# Patient Record
Sex: Female | Born: 1950
Health system: Southern US, Community
[De-identification: ages and names within clinical notes are randomized; demographics above are authoritative.]

## PROBLEM LIST (undated history)

## (undated) DIAGNOSIS — R519 Headache, unspecified: Secondary | ICD-10-CM

## (undated) DIAGNOSIS — T7840XA Allergy, unspecified, initial encounter: Secondary | ICD-10-CM

## (undated) DIAGNOSIS — J45909 Unspecified asthma, uncomplicated: Secondary | ICD-10-CM

## (undated) DIAGNOSIS — E785 Hyperlipidemia, unspecified: Secondary | ICD-10-CM

## (undated) DIAGNOSIS — F32A Depression, unspecified: Secondary | ICD-10-CM

## (undated) DIAGNOSIS — M1990 Unspecified osteoarthritis, unspecified site: Secondary | ICD-10-CM

## (undated) DIAGNOSIS — F419 Anxiety disorder, unspecified: Secondary | ICD-10-CM

## (undated) DIAGNOSIS — G629 Polyneuropathy, unspecified: Secondary | ICD-10-CM

## (undated) DIAGNOSIS — R51 Headache: Secondary | ICD-10-CM

## (undated) DIAGNOSIS — E079 Disorder of thyroid, unspecified: Secondary | ICD-10-CM

## (undated) DIAGNOSIS — Z87442 Personal history of urinary calculi: Secondary | ICD-10-CM

## (undated) DIAGNOSIS — K589 Irritable bowel syndrome without diarrhea: Secondary | ICD-10-CM

## (undated) DIAGNOSIS — Z9889 Other specified postprocedural states: Secondary | ICD-10-CM

## (undated) DIAGNOSIS — R112 Nausea with vomiting, unspecified: Secondary | ICD-10-CM

## (undated) HISTORY — PX: MEDIAL PARTIAL KNEE REPLACEMENT: SHX5965

## (undated) HISTORY — DX: Unspecified asthma, uncomplicated: J45.909

## (undated) HISTORY — DX: Headache, unspecified: R51.9

## (undated) HISTORY — PX: COLONOSCOPY: SHX174

## (undated) HISTORY — DX: Headache: R51

## (undated) HISTORY — PX: KNEE ARTHROSCOPY: SUR90

## (undated) HISTORY — DX: Allergy, unspecified, initial encounter: T78.40XA

## (undated) HISTORY — PX: HERNIA REPAIR: SHX51

## (undated) HISTORY — PX: THYROID SURGERY: SHX805

## (undated) HISTORY — DX: Polyneuropathy, unspecified: G62.9

## (undated) HISTORY — DX: Hyperlipidemia, unspecified: E78.5

---

## 2004-10-24 ENCOUNTER — Other Ambulatory Visit: Payer: Self-pay

## 2004-10-24 ENCOUNTER — Emergency Department: Payer: Self-pay | Admitting: Emergency Medicine

## 2004-11-05 ENCOUNTER — Emergency Department: Payer: Self-pay | Admitting: General Practice

## 2004-11-05 ENCOUNTER — Emergency Department: Payer: Self-pay | Admitting: Emergency Medicine

## 2004-11-10 ENCOUNTER — Emergency Department: Payer: Self-pay | Admitting: General Practice

## 2005-04-15 ENCOUNTER — Ambulatory Visit: Payer: Self-pay | Admitting: Occupational Therapy

## 2013-08-20 ENCOUNTER — Ambulatory Visit: Payer: Self-pay | Admitting: Unknown Physician Specialty

## 2013-09-30 ENCOUNTER — Ambulatory Visit: Payer: Self-pay | Admitting: Unknown Physician Specialty

## 2014-09-20 DIAGNOSIS — M545 Low back pain, unspecified: Secondary | ICD-10-CM | POA: Insufficient documentation

## 2014-09-20 DIAGNOSIS — Z9660 Presence of unspecified orthopedic joint implant: Secondary | ICD-10-CM | POA: Insufficient documentation

## 2014-12-26 ENCOUNTER — Ambulatory Visit: Payer: Self-pay | Admitting: Unknown Physician Specialty

## 2015-02-24 DIAGNOSIS — J449 Chronic obstructive pulmonary disease, unspecified: Secondary | ICD-10-CM | POA: Insufficient documentation

## 2015-05-19 DIAGNOSIS — M5136 Other intervertebral disc degeneration, lumbar region: Secondary | ICD-10-CM | POA: Insufficient documentation

## 2015-05-19 DIAGNOSIS — M5416 Radiculopathy, lumbar region: Secondary | ICD-10-CM | POA: Insufficient documentation

## 2015-07-25 ENCOUNTER — Ambulatory Visit: Payer: Medicare Other | Attending: Specialist

## 2015-07-25 DIAGNOSIS — G4733 Obstructive sleep apnea (adult) (pediatric): Secondary | ICD-10-CM | POA: Insufficient documentation

## 2015-07-25 DIAGNOSIS — R0683 Snoring: Secondary | ICD-10-CM | POA: Diagnosis present

## 2015-07-25 DIAGNOSIS — G4700 Insomnia, unspecified: Secondary | ICD-10-CM | POA: Diagnosis present

## 2015-07-25 DIAGNOSIS — G479 Sleep disorder, unspecified: Secondary | ICD-10-CM | POA: Diagnosis present

## 2015-10-02 DIAGNOSIS — G4700 Insomnia, unspecified: Secondary | ICD-10-CM | POA: Insufficient documentation

## 2015-10-02 DIAGNOSIS — F5104 Psychophysiologic insomnia: Secondary | ICD-10-CM | POA: Insufficient documentation

## 2016-03-11 ENCOUNTER — Emergency Department: Payer: Medicare HMO

## 2016-03-11 ENCOUNTER — Inpatient Hospital Stay
Admission: EM | Admit: 2016-03-11 | Discharge: 2016-03-13 | DRG: 373 | Disposition: A | Discharge status: Home / Self Care | Payer: Medicare HMO | Attending: Surgery | Admitting: Surgery

## 2016-03-11 ENCOUNTER — Encounter: Payer: Self-pay | Admitting: Medical Oncology

## 2016-03-11 DIAGNOSIS — K3580 Unspecified acute appendicitis: Secondary | ICD-10-CM

## 2016-03-11 DIAGNOSIS — K353 Acute appendicitis with localized peritonitis, without perforation or gangrene: Secondary | ICD-10-CM

## 2016-03-11 DIAGNOSIS — Z87442 Personal history of urinary calculi: Secondary | ICD-10-CM

## 2016-03-11 DIAGNOSIS — E215 Disorder of parathyroid gland, unspecified: Secondary | ICD-10-CM | POA: Insufficient documentation

## 2016-03-11 DIAGNOSIS — Z9660 Presence of unspecified orthopedic joint implant: Secondary | ICD-10-CM | POA: Diagnosis present

## 2016-03-11 DIAGNOSIS — Z888 Allergy status to other drugs, medicaments and biological substances status: Secondary | ICD-10-CM

## 2016-03-11 DIAGNOSIS — Z882 Allergy status to sulfonamides status: Secondary | ICD-10-CM

## 2016-03-11 DIAGNOSIS — G43909 Migraine, unspecified, not intractable, without status migrainosus: Secondary | ICD-10-CM | POA: Insufficient documentation

## 2016-03-11 DIAGNOSIS — J449 Chronic obstructive pulmonary disease, unspecified: Secondary | ICD-10-CM | POA: Diagnosis present

## 2016-03-11 DIAGNOSIS — T7840XA Allergy, unspecified, initial encounter: Secondary | ICD-10-CM | POA: Insufficient documentation

## 2016-03-11 DIAGNOSIS — M1990 Unspecified osteoarthritis, unspecified site: Secondary | ICD-10-CM | POA: Insufficient documentation

## 2016-03-11 DIAGNOSIS — Z9889 Other specified postprocedural states: Secondary | ICD-10-CM | POA: Diagnosis not present

## 2016-03-11 HISTORY — DX: Disorder of thyroid, unspecified: E07.9

## 2016-03-11 HISTORY — DX: Unspecified acute appendicitis: K35.80

## 2016-03-11 LAB — CBC
HCT: 39.9 % (ref 35.0–47.0)
HEMOGLOBIN: 13.8 g/dL (ref 12.0–16.0)
MCH: 30.2 pg (ref 26.0–34.0)
MCHC: 34.6 g/dL (ref 32.0–36.0)
MCV: 87.3 fL (ref 80.0–100.0)
PLATELETS: 209 10*3/uL (ref 150–440)
RBC: 4.56 MIL/uL (ref 3.80–5.20)
RDW: 13.3 % (ref 11.5–14.5)
WBC: 5.2 10*3/uL (ref 3.6–11.0)

## 2016-03-11 LAB — URINALYSIS COMPLETE WITH MICROSCOPIC (ARMC ONLY)
BILIRUBIN URINE: NEGATIVE
Glucose, UA: NEGATIVE mg/dL
Hgb urine dipstick: NEGATIVE
KETONES UR: NEGATIVE mg/dL
Leukocytes, UA: NEGATIVE
NITRITE: NEGATIVE
PROTEIN: NEGATIVE mg/dL
RBC / HPF: NONE SEEN RBC/hpf (ref 0–5)
SPECIFIC GRAVITY, URINE: 1.014 (ref 1.005–1.030)
WBC UA: NONE SEEN WBC/hpf (ref 0–5)
pH: 9 — ABNORMAL HIGH (ref 5.0–8.0)

## 2016-03-11 LAB — COMPREHENSIVE METABOLIC PANEL
ALT: 24 U/L (ref 14–54)
AST: 22 U/L (ref 15–41)
Albumin: 4.3 g/dL (ref 3.5–5.0)
Alkaline Phosphatase: 78 U/L (ref 38–126)
Anion gap: 7 (ref 5–15)
BUN: 9 mg/dL (ref 6–20)
CHLORIDE: 105 mmol/L (ref 101–111)
CO2: 24 mmol/L (ref 22–32)
Calcium: 9.4 mg/dL (ref 8.9–10.3)
Creatinine, Ser: 0.56 mg/dL (ref 0.44–1.00)
Glucose, Bld: 107 mg/dL — ABNORMAL HIGH (ref 65–99)
POTASSIUM: 3.7 mmol/L (ref 3.5–5.1)
Sodium: 136 mmol/L (ref 135–145)
TOTAL PROTEIN: 7.2 g/dL (ref 6.5–8.1)
Total Bilirubin: 0.7 mg/dL (ref 0.3–1.2)

## 2016-03-11 LAB — LIPASE, BLOOD: LIPASE: 27 U/L (ref 11–51)

## 2016-03-11 MED ORDER — OXYCODONE HCL 5 MG PO TABS
10.0000 mg | ORAL_TABLET | ORAL | Status: DC | PRN
  Administered 2016-03-12: 10 mg via ORAL
  Filled 2016-03-11: qty 2

## 2016-03-11 MED ORDER — PIPERACILLIN-TAZOBACTAM 3.375 G IVPB
3.3750 g | Freq: Three times a day (TID) | INTRAVENOUS | Status: DC
  Administered 2016-03-11 – 2016-03-13 (×5): 3.375 g via INTRAVENOUS
  Filled 2016-03-11 (×7): qty 50

## 2016-03-11 MED ORDER — DIPHENHYDRAMINE HCL 50 MG/ML IJ SOLN: 12.5000 mg | Freq: Four times a day (QID) | INTRAMUSCULAR | Status: DC | PRN

## 2016-03-11 MED ORDER — PANTOPRAZOLE SODIUM 40 MG IV SOLR
40.0000 mg | Freq: Every day | INTRAVENOUS | Status: DC
  Administered 2016-03-11 – 2016-03-12 (×2): 40 mg via INTRAVENOUS
  Filled 2016-03-11 (×2): qty 40

## 2016-03-11 MED ORDER — KETOROLAC TROMETHAMINE 30 MG/ML IJ SOLN
INTRAMUSCULAR | Status: AC
  Administered 2016-03-11: 30 mg via INTRAVENOUS
  Filled 2016-03-11: qty 1

## 2016-03-11 MED ORDER — ENOXAPARIN SODIUM 40 MG/0.4ML ~~LOC~~ SOLN
40.0000 mg | SUBCUTANEOUS | Status: DC
  Administered 2016-03-12 – 2016-03-13 (×2): 40 mg via SUBCUTANEOUS
  Filled 2016-03-11 (×2): qty 0.4

## 2016-03-11 MED ORDER — PIPERACILLIN-TAZOBACTAM 3.375 G IVPB 30 MIN
3.3750 g | Freq: Once | INTRAVENOUS | Status: AC
  Administered 2016-03-11: 3.375 g via INTRAVENOUS
  Filled 2016-03-11: qty 50

## 2016-03-11 MED ORDER — OXYCODONE-ACETAMINOPHEN 5-325 MG PO TABS
ORAL_TABLET | ORAL | Status: AC
  Administered 2016-03-11: 1 via ORAL
  Filled 2016-03-11: qty 1

## 2016-03-11 MED ORDER — HYDROMORPHONE HCL 1 MG/ML IJ SOLN
0.5000 mg | INTRAMUSCULAR | Status: DC | PRN
  Administered 2016-03-11 – 2016-03-12 (×2): 0.5 mg via INTRAVENOUS
  Filled 2016-03-11 (×2): qty 1

## 2016-03-11 MED ORDER — CYCLOBENZAPRINE HCL 10 MG PO TABS
ORAL_TABLET | ORAL | Status: AC
  Administered 2016-03-11: 10 mg via ORAL
  Filled 2016-03-11: qty 1

## 2016-03-11 MED ORDER — ONDANSETRON 4 MG PO TBDP
4.0000 mg | ORAL_TABLET | Freq: Once | ORAL | Status: AC
  Administered 2016-03-11: 4 mg via ORAL

## 2016-03-11 MED ORDER — IOPAMIDOL (ISOVUE-300) INJECTION 61%
100.0000 mL | Freq: Once | INTRAVENOUS | Status: AC | PRN
  Administered 2016-03-11: 100 mL via INTRAVENOUS

## 2016-03-11 MED ORDER — OXYCODONE-ACETAMINOPHEN 5-325 MG PO TABS
1.0000 | ORAL_TABLET | ORAL | Status: DC | PRN
  Administered 2016-03-11: 1 via ORAL

## 2016-03-11 MED ORDER — QUETIAPINE FUMARATE 25 MG PO TABS
25.0000 mg | ORAL_TABLET | Freq: Every day | ORAL | Status: DC
  Filled 2016-03-11: qty 1

## 2016-03-11 MED ORDER — KETOROLAC TROMETHAMINE 30 MG/ML IJ SOLN
30.0000 mg | Freq: Four times a day (QID) | INTRAMUSCULAR | Status: DC
  Administered 2016-03-11 – 2016-03-13 (×7): 30 mg via INTRAVENOUS
  Filled 2016-03-11 (×7): qty 1

## 2016-03-11 MED ORDER — DIPHENHYDRAMINE HCL 12.5 MG/5ML PO ELIX: 12.5000 mg | ORAL_SOLUTION | Freq: Four times a day (QID) | ORAL | Status: DC | PRN

## 2016-03-11 MED ORDER — ONDANSETRON HCL 4 MG/2ML IJ SOLN
4.0000 mg | Freq: Once | INTRAMUSCULAR | Status: AC
  Administered 2016-03-11: 4 mg via INTRAVENOUS
  Filled 2016-03-11: qty 2

## 2016-03-11 MED ORDER — LACTATED RINGERS IV SOLN
INTRAVENOUS | Status: DC
  Administered 2016-03-11 – 2016-03-13 (×5): via INTRAVENOUS

## 2016-03-11 MED ORDER — ONDANSETRON HCL 4 MG/2ML IJ SOLN: 4.0000 mg | Freq: Four times a day (QID) | INTRAMUSCULAR | Status: DC | PRN

## 2016-03-11 MED ORDER — ONDANSETRON 4 MG PO TBDP
4.0000 mg | ORAL_TABLET | Freq: Four times a day (QID) | ORAL | Status: DC | PRN
  Filled 2016-03-11: qty 1

## 2016-03-11 MED ORDER — SODIUM CHLORIDE 0.9 % IV BOLUS (SEPSIS)
1000.0000 mL | Freq: Once | INTRAVENOUS | Status: AC
  Administered 2016-03-11: 1000 mL via INTRAVENOUS

## 2016-03-11 MED ORDER — HYDROMORPHONE HCL 1 MG/ML IJ SOLN
1.0000 mg | Freq: Once | INTRAMUSCULAR | Status: AC
  Administered 2016-03-11: 1 mg via INTRAVENOUS
  Filled 2016-03-11: qty 1

## 2016-03-11 MED ORDER — HYDROMORPHONE HCL 1 MG/ML IJ SOLN
1.0000 mg | Freq: Once | INTRAMUSCULAR | Status: AC
Start: 2016-03-11 — End: 2016-03-11
  Administered 2016-03-11: 1 mg via INTRAVENOUS
  Filled 2016-03-11: qty 1

## 2016-03-11 MED ORDER — ACETAMINOPHEN 500 MG PO TABS
1000.0000 mg | ORAL_TABLET | Freq: Four times a day (QID) | ORAL | Status: DC
  Filled 2016-03-11: qty 2

## 2016-03-11 MED ORDER — CYCLOBENZAPRINE HCL 10 MG PO TABS
10.0000 mg | ORAL_TABLET | Freq: Three times a day (TID) | ORAL | Status: DC
  Administered 2016-03-11 – 2016-03-13 (×6): 10 mg via ORAL
  Filled 2016-03-11 (×5): qty 1

## 2016-03-11 MED ORDER — ONDANSETRON 4 MG PO TBDP
ORAL_TABLET | ORAL | Status: AC
  Administered 2016-03-11: 4 mg via ORAL
  Filled 2016-03-11: qty 1

## 2016-03-11 NOTE — Progress Notes (Signed)
Pt arrived unit. VSS.  Angus Seller

## 2016-03-11 NOTE — H&P (Signed)
Danielle Nash is an 65 y.o. female.   Chief Complaint: abdominal pain, nausea and vomiting HPI: 65 year old female who comes in today with a complaint of abdominal pain starting around 3 AM today. Patient states that originally the pain was in her entire abdomen and was about a 2 out of 3 and pain. Patient states that she ate a banana this morning around 7 AM and shortly after the pain continued to intensify and go to her lower abdomen both her right and left side. Patient states that she then became nauseated when she was at the hospital and vomited. Patient denies any fever or chills. Patient states that the pain at this point is an 8 out of 10 and is in both her right and left lower quadrants as well as going around and through into her back as well. Patient states that the bumps in the road did not hurt and patient states that she has been moving around and writhing as well as getting up and walking around to try and help the pain however none of this helps. Patient also states that when she moves it does not make the pain worse. Patient is asking for something to eat and states that she is hungry. Patient does endorse some urinary urgency and frequency however denies any pain or burning or any hematuria.  Past Medical History  Diagnosis Date  . Thyroid disease     Past Surgical History  Procedure Laterality Date  . Thyroid surgery    . Joint replacement      History reviewed. No pertinent family history. Social History:  reports that she has never smoked. She does not have any smokeless tobacco history on file. Her alcohol and drug histories are not on file.  Allergies:  Allergies  Allergen Reactions  . Erythromycin   . Sulfa Antibiotics     Other reaction(s): Unknown     (Not in a hospital admission)  Results for orders placed or performed during the hospital encounter of 03/11/16 (from the past 48 hour(s))  Lipase, blood     Status: None   Collection Time: 03/11/16 10:22 AM   Result Value Ref Range   Lipase 27 11 - 51 U/L  Comprehensive metabolic panel     Status: Abnormal   Collection Time: 03/11/16 10:22 AM  Result Value Ref Range   Sodium 136 135 - 145 mmol/L   Potassium 3.7 3.5 - 5.1 mmol/L   Chloride 105 101 - 111 mmol/L   CO2 24 22 - 32 mmol/L   Glucose, Bld 107 (H) 65 - 99 mg/dL   BUN 9 6 - 20 mg/dL   Creatinine, Ser 0.56 0.44 - 1.00 mg/dL   Calcium 9.4 8.9 - 10.3 mg/dL   Total Protein 7.2 6.5 - 8.1 g/dL   Albumin 4.3 3.5 - 5.0 g/dL   AST 22 15 - 41 U/L   ALT 24 14 - 54 U/L   Alkaline Phosphatase 78 38 - 126 U/L   Total Bilirubin 0.7 0.3 - 1.2 mg/dL   GFR calc non Af Amer >60 >60 mL/min   GFR calc Af Amer >60 >60 mL/min    Comment: (NOTE) The eGFR has been calculated using the CKD EPI equation. This calculation has not been validated in all clinical situations. eGFR's persistently <60 mL/min signify possible Chronic Kidney Disease.    Anion gap 7 5 - 15  CBC     Status: None   Collection Time: 03/11/16 10:22 AM  Result Value  Ref Range   WBC 5.2 3.6 - 11.0 K/uL   RBC 4.56 3.80 - 5.20 MIL/uL   Hemoglobin 13.8 12.0 - 16.0 g/dL   HCT 39.9 35.0 - 47.0 %   MCV 87.3 80.0 - 100.0 fL   MCH 30.2 26.0 - 34.0 pg   MCHC 34.6 32.0 - 36.0 g/dL   RDW 13.3 11.5 - 14.5 %   Platelets 209 150 - 440 K/uL  Urinalysis complete, with microscopic ([facility] only)     Status: Abnormal   Collection Time: 03/11/16 10:22 AM  Result Value Ref Range   Color, Urine YELLOW (A) YELLOW   APPearance TURBID (A) CLEAR   Glucose, UA NEGATIVE NEGATIVE mg/dL   Bilirubin Urine NEGATIVE NEGATIVE   Ketones, ur NEGATIVE NEGATIVE mg/dL   Specific Gravity, Urine 1.014 1.005 - 1.030   Hgb urine dipstick NEGATIVE NEGATIVE   pH 9.0 (H) 5.0 - 8.0   Protein, ur NEGATIVE NEGATIVE mg/dL   Nitrite NEGATIVE NEGATIVE   Leukocytes, UA NEGATIVE NEGATIVE   RBC / HPF NONE SEEN 0 - 5 RBC/hpf   WBC, UA NONE SEEN 0 - 5 WBC/hpf   Bacteria, UA RARE (A) NONE SEEN   Squamous Epithelial / LPF  0-5 (A) NONE SEEN   Amorphous Crystal PRESENT    Ct Abdomen Pelvis W Contrast  03/11/2016  CLINICAL DATA:  65 year old female with generalized abdominal pain radiating into the flanks as well as nausea. EXAM: CT ABDOMEN AND PELVIS WITH CONTRAST TECHNIQUE: Multidetector CT imaging of the abdomen and pelvis was performed using the standard protocol following bolus administration of intravenous contrast. CONTRAST:  123m ISOVUE-300 IOPAMIDOL (ISOVUE-300) INJECTION 61% COMPARISON:  None. FINDINGS: Lower chest:  No acute findings. Hepatobiliary: No masses or other significant abnormality. Pancreas: No mass, inflammatory changes, or other significant abnormality. Spleen: Within normal limits in size and appearance. Adrenals/Urinary Tract: Normal adrenal glands. Solitary punctate stone in the interpolar left kidney. Small low-attenuation lesions in the kidneys bilaterally are too small for accurate characterization but statistically highly likely to represent benign cysts. No evidence of hydronephrosis. Partially duplicated left renal collecting system. Stomach/Bowel: Enlarged appendix in the right lower quadrant measuring up to 1.3 cm in diameter. The appendix contains multiple internal appendicolith. There is faint interstitial stranding in the adjacent appendiceal mesentery. No evidence of focal bowel wall thickening or obstruction. No free fluid or suspicious adenopathy. Vascular/Lymphatic: No aneurysm or dissection. No suspicious adenopathy. Reproductive: No mass or other significant abnormality. Other: None. Musculoskeletal: No suspicious bone lesions identified. L2-L3 degenerative disc disease. IMPRESSION: 1. CT findings concerning for early acute appendicitis. The appendix is dilated up to 1.3 cm and contains multiple appendicoliths. There is very mild interstitial stranding in the mesoappendix. No evidence of abscess or rupture. 2. Solitary punctate nonobstructing stone in the interpolar left kidney. 3. Focal  L2-L3 degenerative disc disease. Electronically Signed   By: HJacqulynn CadetM.D.   On: 03/11/2016 13:38    Review of Systems  Constitutional: Positive for malaise/fatigue. Negative for fever, chills and diaphoresis.  HENT: Negative for congestion and sore throat.   Respiratory: Negative for cough, shortness of breath and wheezing.   Cardiovascular: Negative for chest pain, palpitations and leg swelling.  Gastrointestinal: Positive for nausea, vomiting and abdominal pain. Negative for heartburn, diarrhea, constipation and blood in stool.  Genitourinary: Positive for urgency, hematuria and flank pain. Negative for dysuria and frequency.  Musculoskeletal: Positive for back pain. Negative for myalgias and falls.  Skin: Negative for itching and rash.  Neurological:  Positive for weakness. Negative for dizziness, loss of consciousness and headaches.  Psychiatric/Behavioral: Negative for depression. The patient is not nervous/anxious.   All other systems reviewed and are negative.   Blood pressure 121/69, pulse 60, temperature 97.6 F (36.4 C), temperature source Oral, resp. rate 18, height 5' (1.524 m), weight 170 lb (77.111 kg), SpO2 98 %. Physical Exam  Vitals reviewed. Constitutional: She is oriented to person, place, and time. She appears well-developed and well-nourished. No distress.  HENT:  Head: Normocephalic and atraumatic.  Right Ear: External ear normal.  Left Ear: External ear normal.  Nose: Nose normal.  Mouth/Throat: Oropharynx is clear and moist. No oropharyngeal exudate.  Eyes: Conjunctivae and EOM are normal. Pupils are equal, round, and reactive to light. No scleral icterus.  Neck: Normal range of motion. Neck supple. No tracheal deviation present.  Well healed surgical scar   Cardiovascular: Normal rate, regular rhythm, normal heart sounds and intact distal pulses.  Exam reveals no gallop and no friction rub.   No murmur heard. Respiratory: Effort normal and breath  sounds normal. No respiratory distress. She has no wheezes. She has no rales.  GI: Soft. Bowel sounds are normal. She exhibits no distension. There is tenderness. There is no rebound and no guarding.  Left sided CVA tenderness, soft, non-distended, right lower quadrant tenderness and Left lower quadrant tenderness, -Rosving's, -psoas, -obturator sign  Musculoskeletal: Normal range of motion. She exhibits no edema or tenderness.  Neurological: She is alert and oriented to person, place, and time. No cranial nerve deficit.  Skin: Skin is warm and dry. No rash noted. No erythema. No pallor.  Psychiatric: She has a normal mood and affect. Her behavior is normal. Judgment and thought content normal.     Assessment/Plan 65yrold -year-old female with abdominal pain nausea and vomiting. I reviewed her past medical history which is pertinent for some mild COPD as well as degenerative disc disease in her back. I reviewed her laboratory values which show a normal white blood cell count of 5.2 and otherwise lab values within normal limits. I also personally reviewed her CT scan images which do show an enlarged appendix to 11 mm in size however not any significant amount of stranding around the area as well as a left kidney stone in the left kidney and a small stone that looks to be in the left ureter to my evaluation. I also reviewed the radiology read as above.  I did discuss with the patient that she does not have the typical presentation for acute appendicitis and while her appendix is enlarged and there is not significant inflammation around it. Also discussed with her that she may have a kidney stone in her left ureter.   The risks, benefits, complications, treatment options, and expected outcomes were discussed with the patient. The treatment of antibiotics alone was discussed giving a 20% chance that this could fail and surgery would be necessary.  Also discussed continuing to the operating room for  Laparoscopic Appendectomy.  The possibilities of  bleeding, recurrent infection, perforation of viscus, finding a normal appendix, the need for additional procedures, failure to diagnose a condition, conversion to open procedure and creating a complication requiring transfusion or further operations were discussed. The patient was given the opportunity to ask questions and have them answered.  The patient would like to proceed with antibiotic therapy for the potential acute appendicitis. I will met her put her on IV antibiotics and continue keeping her hydrated as well as give  her IV pain medicine.  If she were to worsen from a laboratory or clinical standpoint we may still need to proceed with a laparoscopic appendectomy. Patient is aware and is in agreement with this plan.  Hubbard Robinson, MD 03/11/2016, 6:54 PM

## 2016-03-11 NOTE — ED Notes (Signed)
Pt reports last night around 1130pm she began having generalized abd pain that radiates into bilt flank. Also reports nausea without vomiting. Denies dysuria.

## 2016-03-11 NOTE — ED Provider Notes (Signed)
[facility] Emergency Department Provider Note  ____________________________________________  Time seen: 11:00 AM  I have reviewed the triage vital signs and the nursing notes.   HISTORY  Chief Complaint Abdominal Pain and Nausea    HPI Danielle Nash is a 65 y.o. female reports around midnight last night she started having generalized abdominal pain that radiates into her bilateral flanks. It is gradually worsening and constant. She cannot get comfortable. She also has nausea but no vomiting. Has a history of kidney stones but this feels much different. No urinary symptoms. No chest pain shortness of breath or fever. No diarrhea or constipation.  Last ate a banana and drink some water at 7 AM today which she vomited afterward.     Past Medical History  Diagnosis Date  . Thyroid disease      There are no active problems to display for this patient.    Past Surgical History  Procedure Laterality Date  . Thyroid surgery    . Joint replacement       No current outpatient prescriptions on file.   Allergies Erythromycin   No family history on file.  Social History Social History  Substance Use Topics  . Smoking status: Never Smoker   . Smokeless tobacco: None  . Alcohol Use: None    Review of Systems  Constitutional:   No fever or chills. No weight changes Eyes:   No vision changes.  ENT:   No sore throat. No rhinorrhea. Cardiovascular:   No chest pain. Respiratory:   No dyspnea or cough. Gastrointestinal:  Generalized abdominal pain without vomiting and diarrhea.  No BRBPR or melena. Genitourinary:   Negative for dysuria or difficulty urinating. Musculoskeletal:   Negative for focal pain or swelling Skin:   Negative for rash. Neurological:   Negative for headaches, focal weakness or numbness.  10-point ROS otherwise negative.  ____________________________________________   PHYSICAL EXAM:  VITAL SIGNS: ED Triage Vitals   Enc Vitals Group     BP 03/11/16 1014 153/89 mmHg     Pulse Rate 03/11/16 1014 60     Resp 03/11/16 1014 20     Temp 03/11/16 1014 97.6 F (36.4 C)     Temp Source 03/11/16 1014 Oral     SpO2 03/11/16 1014 99 %     Weight 03/11/16 1014 170 lb (77.111 kg)     Height 03/11/16 1014 5' (1.524 m)     Head Cir --      Peak Flow --      Pain Score 03/11/16 1015 7     Pain Loc --      Pain Edu? --      Excl. in [place]? --     Vital signs reviewed, nursing assessments reviewed.   Constitutional:   Alert and oriented. Moderate distress due to severe pain Eyes:   No scleral icterus. No conjunctival pallor. PERRL. EOMI ENT   Head:   Normocephalic and atraumatic.   Nose:   No congestion/rhinnorhea. No septal hematoma   Mouth/Throat:   MMM, no pharyngeal erythema. No peritonsillar mass.    Neck:   No stridor. No SubQ emphysema. No meningismus. Hematological/Lymphatic/Immunilogical:   No cervical lymphadenopathy. Cardiovascular:   RRR. Symmetric bilateral radial and DP pulses.  No murmurs.  Respiratory:   Normal respiratory effort without tachypnea nor retractions. Breath sounds are clear and equal bilaterally. No wheezes/rales/rhonchi. Gastrointestinal:   Soft with diffuse abdominal tenderness, worse in the right lower quadrant. There is some mild  CVA tenderness bilaterally..  No rebound, rigidity, or guarding. Genitourinary:   deferred Musculoskeletal:   Nontender with normal range of motion in all extremities. No joint effusions.  No lower extremity tenderness.  No edema. Neurologic:   Normal speech and language.  CN 2-10 normal. Motor grossly intact. No gross focal neurologic deficits are appreciated.  Skin:    Skin is warm, dry and intact. No rash noted.  No petechiae, purpura, or bullae. Psychiatric:   Mood and affect are normal. ____________________________________________    LABS (pertinent positives/negatives) (all labs ordered are listed, but only abnormal results  are displayed) Labs Reviewed  COMPREHENSIVE METABOLIC PANEL - Abnormal; Notable for the following:    Glucose, Bld 107 (*)    All other components within normal limits  URINALYSIS COMPLETEWITH MICROSCOPIC ([facility] ONLY) - Abnormal; Notable for the following:    Color, Urine YELLOW (*)    APPearance TURBID (*)    pH 9.0 (*)    Bacteria, UA RARE (*)    Squamous Epithelial / LPF 0-5 (*)    All other components within normal limits  LIPASE, BLOOD  CBC   ____________________________________________   EKG  Interpreted by me Sinus bradycardia rate 53, normal axis intervals QRS ST segments and T waves  ____________________________________________    RADIOLOGY  CT scan shows a 1.3 cm dilated appendix with multiple appendicoliths consistent with acute appendicitis.  ____________________________________________   PROCEDURES   ____________________________________________   INITIAL IMPRESSION / ASSESSMENT AND PLAN / ED COURSE  Pertinent labs & imaging results that were available during my care of the patient were reviewed by me and considered in my medical decision making (see chart for details).  Patient presents with severe abdominal pain. Labs and exam are not very localizing so proceeded to CT scan. CT positive for acute appendicitis. Pain is much more improved with IV Dilaudid. Case discussed with surgery at 2:20 PM who will direct the patient in the ED for further management. IV Zosyn for now, continue IV fluids. Pain control and antiemetics as needed. Nothing by mouth.     ____________________________________________   FINAL CLINICAL IMPRESSION(S) / ED DIAGNOSES  Final diagnoses:  Acute appendicitis with localized peritonitis      [name], MD 03/11/16 1421

## 2016-03-12 LAB — CBC
HCT: 34.5 % — ABNORMAL LOW (ref 35.0–47.0)
HEMOGLOBIN: 11.9 g/dL — AB (ref 12.0–16.0)
MCH: 30.2 pg (ref 26.0–34.0)
MCHC: 34.6 g/dL (ref 32.0–36.0)
MCV: 87.3 fL (ref 80.0–100.0)
Platelets: 184 10*3/uL (ref 150–440)
RBC: 3.95 MIL/uL (ref 3.80–5.20)
RDW: 13.5 % (ref 11.5–14.5)
WBC: 8.9 10*3/uL (ref 3.6–11.0)

## 2016-03-12 LAB — BASIC METABOLIC PANEL
ANION GAP: 4 — AB (ref 5–15)
BUN: 8 mg/dL (ref 6–20)
CALCIUM: 8.4 mg/dL — AB (ref 8.9–10.3)
CHLORIDE: 108 mmol/L (ref 101–111)
CO2: 24 mmol/L (ref 22–32)
Creatinine, Ser: 0.49 mg/dL (ref 0.44–1.00)
GFR calc non Af Amer: >60 mL/min (ref >60)
Glucose, Bld: 93 mg/dL (ref 65–99)
Potassium: 3.1 mmol/L — ABNORMAL LOW (ref 3.5–5.1)
SODIUM: 136 mmol/L (ref 135–145)

## 2016-03-12 NOTE — Progress Notes (Signed)
65yr old female with acute appendicitis treating with antibiotics.  Patient feeling much better today.  She states pain much improved.  She is walking around and passing gas tolerating clear liquids well.  She would like to go home tomorrow if possible.   Filed Vitals:   03/12/16 0422 03/12/16 1401  BP:  143/74  Pulse: 63 68  Temp:  97.5 F (36.4 C)  Resp:  18    I/O last 3 completed shifts: In: 2937 [P.O.:750; I.V.:2097; IV Piggyback:90] Out: 950 [Urine:950]     PE:  Gen: NAD Res: CTAB/L  Cardio: RRR Abd: soft, mild tenderness in RLQ, non in LLQ or suprapubic today Ext: 2+ pulses, no edema  CBC Latest Ref Rng 03/12/2016 03/11/2016  WBC 3.6 - 11.0 K/uL 8.9 5.2  Hemoglobin 12.0 - 16.0 g/dL 11.9(L) 13.8  Hematocrit 35.0 - 47.0 % 34.5(L) 39.9  Platelets 150 - 440 K/uL 184 209   CMP Latest Ref Rng 03/12/2016 03/11/2016  Glucose 65 - 99 mg/dL 93 107(H)  BUN 6 - 20 mg/dL 8 9  Creatinine 0.44 - 1.00 mg/dL 0.49 0.56  Sodium 135 - 145 mmol/L 136 136  Potassium 3.5 - 5.1 mmol/L 3.1(L) 3.7  Chloride 101 - 111 mmol/L 108 105  CO2 22 - 32 mmol/L 24 24  Calcium 8.9 - 10.3 mg/dL 8.4(L) 9.4  Total Protein 6.5 - 8.1 g/dL - 7.2  Total Bilirubin 0.3 - 1.2 mg/dL - 0.7  Alkaline Phos 38 - 126 U/L - 78  AST 15 - 41 U/L - 22  ALT 14 - 54 U/L - 24    A/P:  65 yr old with acute appendicitis tx with antibitoics, much improved from yesterday.  Pain: continue toradol, flexeril and prns Appendicitis: continue zosyn, will advance diet tomorrow if continues to improve Encourage ambulation

## 2016-03-13 MED ORDER — CIPROFLOXACIN HCL 500 MG PO TABS: 500.0000 mg | ORAL_TABLET | Freq: Two times a day (BID) | ORAL | Status: DC

## 2016-03-13 MED ORDER — KETOROLAC TROMETHAMINE 10 MG PO TABS
10.0000 mg | ORAL_TABLET | Freq: Four times a day (QID) | ORAL | Status: DC
  Administered 2016-03-13: 10 mg via ORAL
  Filled 2016-03-13: qty 1

## 2016-03-13 MED ORDER — OXYCODONE HCL 10 MG PO TABS: 10.0000 mg | ORAL_TABLET | ORAL | Status: DC | PRN

## 2016-03-13 MED ORDER — METRONIDAZOLE 500 MG PO TABS: 500.0000 mg | ORAL_TABLET | Freq: Three times a day (TID) | ORAL | Status: DC

## 2016-03-13 NOTE — Progress Notes (Signed)
CC: Right lower quadrant pain Subjective: Patient feels much better today she still has occasional twinges in the right lower quadrant but feels much better on antibiotics is tolerating a diet and wants to be discharged at this point. She denies fevers or chills. Objective: Vital signs in last 24 hours: Temp:  [97.5 F (36.4 C)-98.2 F (36.8 C)] 98 F (36.7 C) (03/29 0548) Pulse Rate:  [68-72] 72 (03/29 0548) Resp:  [18-19] 18 (03/29 0548) BP: (119-143)/(48-77) 119/48 mmHg (03/29 0548) SpO2:  [94 %-100 %] 94 % (03/29 0548) Last BM Date: 03/11/16  Intake/Output from previous day: 03/28 0701 - 03/29 0700 In: 4378 [P.O.:1500; I.V.:2788; IV Piggyback:90] Out: 2300 [Urine:2300] Intake/Output this shift: Total I/O In: 136 [I.V.:126; IV Piggyback:10] Out: 1400 [Urine:1400]  Physical exam:  Awake alert oriented comfortable-appearing abdomen is soft nontender no peritoneal signs negative Rovsing sign nontender calves  Lab Results: CBC   Recent Labs  03/11/16 1022 03/12/16 0347  WBC 5.2 8.9  HGB 13.8 11.9*  HCT 39.9 34.5*  PLT 209 184   BMET  Recent Labs  03/11/16 1022 03/12/16 0347  NA 136 136  K 3.7 3.1*  CL 105 108  CO2 24 24  GLUCOSE 107* 93  BUN 9 8  CREATININE 0.56 0.49  CALCIUM 9.4 8.4*   PT/INR No results for input(s): LABPROT, INR in the last 72 hours. ABG No results for input(s): PHART, HCO3 in the last 72 hours.  Invalid input(s): PCO2, PO2  Studies/Results: Ct Abdomen Pelvis W Contrast  03/11/2016  CLINICAL DATA:  65 year old female with generalized abdominal pain radiating into the flanks as well as nausea. EXAM: CT ABDOMEN AND PELVIS WITH CONTRAST TECHNIQUE: Multidetector CT imaging of the abdomen and pelvis was performed using the standard protocol following bolus administration of intravenous contrast. CONTRAST:  159mL ISOVUE-300 IOPAMIDOL (ISOVUE-300) INJECTION 61% COMPARISON:  None. FINDINGS: Lower chest:  No acute findings. Hepatobiliary: No  masses or other significant abnormality. Pancreas: No mass, inflammatory changes, or other significant abnormality. Spleen: Within normal limits in size and appearance. Adrenals/Urinary Tract: Normal adrenal glands. Solitary punctate stone in the interpolar left kidney. Small low-attenuation lesions in the kidneys bilaterally are too small for accurate characterization but statistically highly likely to represent benign cysts. No evidence of hydronephrosis. Partially duplicated left renal collecting system. Stomach/Bowel: Enlarged appendix in the right lower quadrant measuring up to 1.3 cm in diameter. The appendix contains multiple internal appendicolith. There is faint interstitial stranding in the adjacent appendiceal mesentery. No evidence of focal bowel wall thickening or obstruction. No free fluid or suspicious adenopathy. Vascular/Lymphatic: No aneurysm or dissection. No suspicious adenopathy. Reproductive: No mass or other significant abnormality. Other: None. Musculoskeletal: No suspicious bone lesions identified. L2-L3 degenerative disc disease. IMPRESSION: 1. CT findings concerning for early acute appendicitis. The appendix is dilated up to 1.3 cm and contains multiple appendicoliths. There is very mild interstitial stranding in the mesoappendix. No evidence of abscess or rupture. 2. Solitary punctate nonobstructing stone in the interpolar left kidney. 3. Focal L2-L3 degenerative disc disease. Electronically Signed   By: Jacqulynn Cadet M.D.   On: 03/11/2016 13:38    Anti-infectives: Anti-infectives    Start     Dose/Rate Route Frequency Ordered Stop   03/11/16 2200  piperacillin-tazobactam (ZOSYN) IVPB 3.375 g     3.375 g 12.5 mL/hr over 240 Minutes Intravenous 3 times per day 03/11/16 1751     03/11/16 1430  piperacillin-tazobactam (ZOSYN) IVPB 3.375 g     3.375 g 100 mL/hr  over 30 Minutes Intravenous  Once 03/11/16 1417 03/11/16 1609      Assessment/Plan:  Normal white blood cell  count and improving physical exam. Patient feels better wants to be discharged we'll send her home on oral antibiotics and oral analgesics to follow up with Dr. Azalee Course in 10 days. She understands return to the emergency room should her pain or symptoms worsen.  Florene Glen, MD, FACS  03/13/2016

## 2016-03-13 NOTE — Discharge Instructions (Signed)
Resume normal activity and a regular diet. Follow-up with Dr. Azalee Course in 6-8 days.

## 2016-03-13 NOTE — Progress Notes (Signed)
Pt has ambulated multiple times to the bathroom within her room without any complaints of abdomen pain. Will continue to encourage ambulation.  Angus Seller

## 2016-03-13 NOTE — Progress Notes (Signed)
IV was removed. Pt will eat lunch and then be ready for discharge. Narcotic script will be given to pt.

## 2016-03-13 NOTE — Discharge Summary (Signed)
Physician Discharge Summary  Patient ID: SUZUKO SAJDAK MRN: [id number] DOB/AGE: February 25, 1951 65 y.o.  Admit date: 03/11/2016 Discharge date: 03/13/2016   Discharge Diagnoses:  Active Problems:   Acute appendicitis, uncomplicated   Procedures:None  Hospital Course: This patient admitted the hospital by Dr. Azalee Course with signs of acute appendicitis and elected to be treated with IV antibiotics. Her symptoms have improved considerably and she will be discharged on oral antibiotics and oral analgesics to follow up with Dr. Azalee Course in 6-8 days. She is also instructed to return should her pain worsen while on oral antibiotics.  Consults: None  Disposition: Final discharge disposition not confirmed     Medication List    TAKE these medications        butalbital-acetaminophen-caffeine 50-325-40 MG tablet  Commonly known as:  FIORICET, ESGIC  Take by mouth.     ciprofloxacin 500 MG tablet  Commonly known as:  CIPRO  Take 1 tablet (500 mg total) by mouth 2 (two) times daily.     cyclobenzaprine 5 MG tablet  Commonly known as:  FLEXERIL  Take by mouth.     meclizine 12.5 MG tablet  Commonly known as:  ANTIVERT  Take by mouth.     metroNIDAZOLE 500 MG tablet  Commonly known as:  FLAGYL  Take 1 tablet (500 mg total) by mouth 3 (three) times daily.     Oxycodone HCl 10 MG Tabs  Take 1 tablet (10 mg total) by mouth every 3 (three) hours as needed for moderate pain or severe pain.     QUEtiapine 25 MG tablet  Commonly known as:  SEROQUEL           Follow-up Information    Follow up with Hubbard Robinson, MD.   Specialty:  Surgery   Why:  As needed   Contact information:   [address] [place] [address] [phone]       Florene Glen, MD, FACS

## 2016-03-13 NOTE — Progress Notes (Signed)
Pt was discharged and walked downstairs to her husband. Discharge instructions and prescription was given to the pt.

## 2016-03-25 ENCOUNTER — Encounter: Payer: Self-pay | Admitting: Surgery

## 2016-03-25 ENCOUNTER — Ambulatory Visit: Payer: Medicare Other | Admitting: Surgery

## 2016-03-25 ENCOUNTER — Ambulatory Visit (INDEPENDENT_AMBULATORY_CARE_PROVIDER_SITE_OTHER): Payer: Medicare HMO | Admitting: Surgery

## 2016-03-25 VITALS — BP 152/82 | HR 75 | Temp 98.1°F | Ht 60.0 in | Wt 172.0 lb

## 2016-03-25 DIAGNOSIS — G8929 Other chronic pain: Secondary | ICD-10-CM

## 2016-03-25 DIAGNOSIS — R1031 Right lower quadrant pain: Secondary | ICD-10-CM | POA: Diagnosis not present

## 2016-03-25 NOTE — Patient Instructions (Addendum)
Please go tot the lab for the C-Diff test and we will call you with results.

## 2016-03-25 NOTE — Progress Notes (Signed)
Outpatient Surgical Follow Up  03/25/2016  Danielle Nash is an 65 y.o. female.   CC:rlq abd pain  HPI: This patient admitted the hospital by Dr. Heath Lark with a history of right lower quadrant pain her workup suggesting appendicitis. Patient declined surgery at that point and was treated with IV antibiotics switch to by mouth antibiotics and discharged. She now describes no abdominal pain no fevers chills no nausea vomiting is tolerating a regular diet she does have some loose stools however  Past Medical History  Diagnosis Date  . Thyroid disease     Past Surgical History  Procedure Laterality Date  . Thyroid surgery    . Joint replacement      No family history on file.  Social History:  reports that she has never smoked. She does not have any smokeless tobacco history on file. Her alcohol and drug histories are not on file.  Allergies:  Allergies  Allergen Reactions  . Erythromycin   . Sulfa Antibiotics     Other reaction(s): Unknown    Medications reviewed.   Review of Systems:   Review of Systems  Constitutional: Negative for fever, chills and weight loss.  HENT: Negative.   Eyes: Negative.   Respiratory: Negative.   Cardiovascular: Negative.   Gastrointestinal: Positive for diarrhea. Negative for heartburn, nausea, vomiting, abdominal pain, constipation and blood in stool.  Genitourinary: Negative.   Musculoskeletal: Negative.   Skin: Negative.   Neurological: Negative.   Endo/Heme/Allergies: Negative.   Psychiatric/Behavioral: Negative.      Physical Exam:  BP 152/82 mmHg  Pulse 75  Temp(Src) 98.1 F (36.7 C) (Oral)  Ht 5' (1.524 m)  Wt 172 lb (78.019 kg)  BMI 33.59 kg/m2  Physical Exam  Constitutional: She is oriented to person, place, and time and well-developed, well-nourished, and in no distress. No distress.  HENT:  Head: Normocephalic and atraumatic.  Eyes: Pupils are equal, round, and reactive to light. Right eye exhibits no discharge.  Left eye exhibits no discharge. No scleral icterus.  Neck: Normal range of motion.  Cardiovascular: Normal rate and regular rhythm.   Pulmonary/Chest: Breath sounds normal. No respiratory distress. She has no wheezes. She has no rales.  Abdominal: Soft. She exhibits no distension. There is no tenderness. There is no rebound and no guarding.  Musculoskeletal: Normal range of motion. She exhibits no edema.  Lymphadenopathy:    She has no cervical adenopathy.  Neurological: She is alert and oriented to person, place, and time.  Skin: Skin is warm and dry. She is not diaphoretic.  Psychiatric: Mood and affect normal.      No results found for this or any previous visit (from the past 48 hour(s)). No results found.  Assessment/Plan:  Suspect appendicitis which has been treated successfully with IV and oral antibiotics she is now having some loose stools we will check a C. difficile PCR and call her with those results otherwise she can follow up on an as-needed basis.  Florene Glen, MD, FACS

## 2016-03-26 ENCOUNTER — Other Ambulatory Visit
Admission: RE | Admit: 2016-03-26 | Discharge: 2016-03-26 | Disposition: A | Discharge status: Home / Self Care | Payer: Medicare HMO | Source: Ambulatory Visit | Attending: Surgery | Admitting: Surgery

## 2016-03-26 DIAGNOSIS — G8929 Other chronic pain: Secondary | ICD-10-CM | POA: Insufficient documentation

## 2016-03-26 DIAGNOSIS — R1031 Right lower quadrant pain: Secondary | ICD-10-CM | POA: Insufficient documentation

## 2016-03-26 LAB — C DIFFICILE QUICK SCREEN W PCR REFLEX
C DIFFICILE (CDIFF) INTERP: NEGATIVE
C DIFFICILE (CDIFF) TOXIN: NEGATIVE
C DIFFICLE (CDIFF) ANTIGEN: NEGATIVE

## 2016-04-03 ENCOUNTER — Telehealth: Payer: Self-pay

## 2016-04-03 NOTE — Telephone Encounter (Signed)
Called patient to let her know the C-Diff test was negatl. She stated her diarrhea was better the next day  After taking the test. She was asked to call the office if she had any questions or concerns.

## 2016-06-12 ENCOUNTER — Ambulatory Visit (INDEPENDENT_AMBULATORY_CARE_PROVIDER_SITE_OTHER): Payer: Medicare HMO | Admitting: Internal Medicine

## 2016-06-12 ENCOUNTER — Encounter: Payer: Self-pay | Admitting: Internal Medicine

## 2016-06-12 VITALS — BP 138/76 | HR 65 | Temp 98.4°F | Ht 60.0 in | Wt 158.0 lb

## 2016-06-12 DIAGNOSIS — G4700 Insomnia, unspecified: Secondary | ICD-10-CM | POA: Diagnosis not present

## 2016-06-12 DIAGNOSIS — J45909 Unspecified asthma, uncomplicated: Secondary | ICD-10-CM | POA: Insufficient documentation

## 2016-06-12 DIAGNOSIS — E079 Disorder of thyroid, unspecified: Secondary | ICD-10-CM | POA: Diagnosis not present

## 2016-06-12 DIAGNOSIS — J4520 Mild intermittent asthma, uncomplicated: Secondary | ICD-10-CM | POA: Diagnosis not present

## 2016-06-12 DIAGNOSIS — M5136 Other intervertebral disc degeneration, lumbar region: Secondary | ICD-10-CM

## 2016-06-12 DIAGNOSIS — M51369 Other intervertebral disc degeneration, lumbar region without mention of lumbar back pain or lower extremity pain: Secondary | ICD-10-CM

## 2016-06-12 DIAGNOSIS — R51 Headache: Secondary | ICD-10-CM

## 2016-06-12 DIAGNOSIS — R519 Headache, unspecified: Secondary | ICD-10-CM

## 2016-06-12 NOTE — Assessment & Plan Note (Signed)
Will check TSH and T4 at annual exam

## 2016-06-12 NOTE — Assessment & Plan Note (Signed)
Encouraged weight loss Advised her to take Aleve daily and reserve Flexeril for severe pain

## 2016-06-12 NOTE — Assessment & Plan Note (Signed)
Continue Claritin daily and Albuterol prn

## 2016-06-12 NOTE — Progress Notes (Signed)
HPI  Pt presents to the clinic today to establish care and for management of the conditions listed below. She is transferring care from the [facility].  Low back pain: She takes Aleve and Flexeril occasionally for her back pain.  Thyroid disease: s/p parathyroidectomy. She has never been on thyroid medication. She does c/o weight gain, fatigue and difficulty sleeping.  Allergy induced asthma: Occurs all year long. She takes Claritin daily and Albuterol as needed.  Frequent Headaches: These occur every few months. The are located in her forehead. The pain radiates to the back of her head. She describes the pais as squeezing. She denies visual changes, dizziness, sensitivity to light or sound, nausea or vomiting. She takes Fioricet with good relief.  Insomnia: She has trouble falling asleep and staying asleep. She has take Seroquel in the past but she reports it made her too drowsy during the day. She has had a sleep study which was negative for OSA.  Left side chest wall pain. Started last night. She describes the pain as grabbing. She denies nausea, reflux, chest pain or shortness of breath. She has not tried anything OTC for this.  Flu: never Tetanus: > 10 years ago Pneumovax: 2015 Prenvar: unsure Zostovax: 2015 Pap Smear: never Mammogram: never Bone Density: Colon Screening: > 10 years ago Vision Screening: yearly at [facility] eye care Dentist: never  Past Medical History  Diagnosis Date  . Thyroid disease   . Allergy   . Allergy-induced asthma   . Frequent headaches     Current Outpatient Prescriptions  Medication Sig Dispense Refill  . butalbital-acetaminophen-caffeine (FIORICET, ESGIC) 50-325-40 MG tablet Take by mouth as needed. Reported on 03/25/2016    . cyclobenzaprine (FLEXERIL) 5 MG tablet Take by mouth. Reported on 03/25/2016    . meclizine (ANTIVERT) 12.5 MG tablet Take by mouth. Reported on 03/25/2016     No current facility-administered medications for this  visit.    Allergies  Allergen Reactions  . Erythromycin   . Oxycodone Other (See Comments)    depression  . Sulfa Antibiotics     Other reaction(s): Unknown    Family History  Problem Relation Age of Onset  . Arthritis Mother   . Diabetes Mother   . Heart disease Father   . Breast cancer Sister   . Diabetes Maternal Uncle     Social History   Social History  . Marital Status: Married    Spouse Name: N/A  . Number of Children: N/A  . Years of Education: N/A   Occupational History  . Not on file.   Social History Main Topics  . Smoking status: Never Smoker   . Smokeless tobacco: Never Used  . Alcohol Use: 0.0 oz/week    0 Standard drinks or equivalent per week     Comment: rare  . Drug Use: Not on file  . Sexual Activity: Not on file   Other Topics Concern  . Not on file   Social History Narrative    ROS:  Constitutional: Denies fever, malaise, fatigue, headache or abrupt weight changes.  HEENT: Denies eye pain, eye redness, ear pain, ringing in the ears, wax buildup, runny nose, nasal congestion, bloody nose, or sore throat. Respiratory: Denies difficulty breathing, shortness of breath, cough or sputum production.   Cardiovascular: Denies chest pain, chest tightness, palpitations or swelling in the hands or feet.  Gastrointestinal: Denies abdominal pain, bloating, constipation, diarrhea or blood in the stool.  GU: Denies frequency, urgency, pain with urination, blood in  urine, odor or discharge. Musculoskeletal: Pt reports chest wall pain and low back pain.. Denies decrease in range of motion, difficulty with gait, or joint swelling.  Skin: Denies redness, rashes, lesions or ulcercations.  Neurological: Denies dizziness, difficulty with memory, difficulty with speech or problems with balance and coordination.  Psych: Denies anxiety, depression, SI/HI.  No other specific complaints in a complete review of systems (except as listed in HPI above).  PE:  BP  138/76 mmHg  Pulse 65  Temp(Src) 98.4 F (36.9 C) (Oral)  Ht 5' (1.524 m)  Wt 158 lb (71.668 kg)  BMI 30.86 kg/m2  SpO2 98%  Wt Readings from Last 3 Encounters:  03/25/16 172 lb (78.019 kg)  03/11/16 183 lb 3.2 oz (83.099 kg)    General: Appears her stated age, obese in NAD. HEENT: Head: normal shape and size; Ears: Tm's gray and intact, normal light reflex;Throat/Mouth: Teeth present, mucosa pink and moist, no lesions or ulcerations noted.  Neck: Neck supple, trachea midline. No masses, lumps or thyromegaly present.  Cardiovascular: Normal rate and rhythm. S1,S2 noted.  No murmur, rubs or gallops noted.  Pulmonary/Chest: Normal effort and positive vesicular breath sounds. No respiratory distress. No wheezes, rales or ronchi noted.  Musculoskeletal: Pain with palpation of the left chest wall, underneath the left breast. Neurological: Alert and oriented.  Psychiatric: Mood and affect normal. Behavior is normal. Judgment and thought content normal.     BMET    Component Value Date/Time   NA 136 03/12/2016 0347   K 3.1* 03/12/2016 0347   CL 108 03/12/2016 0347   CO2 24 03/12/2016 0347   GLUCOSE 93 03/12/2016 0347   BUN 8 03/12/2016 0347   CREATININE 0.49 03/12/2016 0347   CALCIUM 8.4* 03/12/2016 0347   GFRNONAA >60 03/12/2016 0347   GFRAA >60 03/12/2016 0347    Lipid Panel  No results found for: CHOL, TRIG, HDL, CHOLHDL, VLDL, LDLCALC  CBC    Component Value Date/Time   WBC 8.9 03/12/2016 0347   RBC 3.95 03/12/2016 0347   HGB 11.9* 03/12/2016 0347   HCT 34.5* 03/12/2016 0347   PLT 184 03/12/2016 0347   MCV 87.3 03/12/2016 0347   MCH 30.2 03/12/2016 0347   MCHC 34.6 03/12/2016 0347   RDW 13.5 03/12/2016 0347    Hgb A1C No results found for: HGBA1C   Assessment and Plan:

## 2016-06-12 NOTE — Progress Notes (Signed)
Pre visit review using our clinic review tool, if applicable. No additional management support is needed unless otherwise documented below in the visit note. 

## 2016-06-12 NOTE — Assessment & Plan Note (Signed)
?   Stress induced Continue Fioricet prn

## 2016-06-12 NOTE — Patient Instructions (Signed)
Insomnia Insomnia is a sleep disorder that makes it difficult to fall asleep or to stay asleep. Insomnia can cause tiredness (fatigue), low energy, difficulty concentrating, mood swings, and poor performance at work or school.  There are three different ways to classify insomnia:  Difficulty falling asleep.  Difficulty staying asleep.  Waking up too early in the morning. Any type of insomnia can be long-term (chronic) or short-term (acute). Both are common. Short-term insomnia usually lasts for three months or less. Chronic insomnia occurs at least three times a week for longer than three months. CAUSES  Insomnia may be caused by another condition, situation, or substance, such as:  Anxiety.  Certain medicines.  Gastroesophageal reflux disease (GERD) or other gastrointestinal conditions.  Asthma or other breathing conditions.  Restless legs syndrome, sleep apnea, or other sleep disorders.  Chronic pain.  Menopause. This may include hot flashes.  Stroke.  Abuse of alcohol, tobacco, or illegal drugs.  Depression.  Caffeine.   Neurological disorders, such as Alzheimer disease.  An overactive thyroid (hyperthyroidism). The cause of insomnia may not be known. RISK FACTORS Risk factors for insomnia include:  Gender. Women are more commonly affected than men.  Age. Insomnia is more common as you get older.  Stress. This may involve your professional or personal life.  Income. Insomnia is more common in people with lower income.  Lack of exercise.   Irregular work schedule or night shifts.  Traveling between different time zones. SIGNS AND SYMPTOMS If you have insomnia, trouble falling asleep or trouble staying asleep is the main symptom. This may lead to other symptoms, such as:  Feeling fatigued.  Feeling nervous about going to sleep.  Not feeling rested in the morning.  Having trouble concentrating.  Feeling irritable, anxious, or depressed. TREATMENT   Treatment for insomnia depends on the cause. If your insomnia is caused by an underlying condition, treatment will focus on addressing the condition. Treatment may also include:   Medicines to help you sleep.  Counseling or therapy.  Lifestyle adjustments. HOME CARE INSTRUCTIONS   Take medicines only as directed by your health care provider.  Keep regular sleeping and waking hours. Avoid naps.  Keep a sleep diary to help you and your health care provider figure out what could be causing your insomnia. Include:   When you sleep.  When you wake up during the night.  How well you sleep.   How rested you feel the next day.  Any side effects of medicines you are taking.  What you eat and drink.   Make your bedroom a comfortable place where it is easy to fall asleep:  Put up shades or special blackout curtains to block light from outside.  Use a white noise machine to block noise.  Keep the temperature cool.   Exercise regularly as directed by your health care provider. Avoid exercising right before bedtime.  Use relaxation techniques to manage stress. Ask your health care provider to suggest some techniques that may work well for you. These may include:  Breathing exercises.  Routines to release muscle tension.  Visualizing peaceful scenes.  Cut back on alcohol, caffeinated beverages, and cigarettes, especially close to bedtime. These can disrupt your sleep.  Do not overeat or eat spicy foods right before bedtime. This can lead to digestive discomfort that can make it hard for you to sleep.  Limit screen use before bedtime. This includes:  Watching TV.  Using your smartphone, tablet, and computer.  Stick to a routine. This   can help you fall asleep faster. Try to do a quiet activity, brush your teeth, and go to bed at the same time each night.  Get out of bed if you are still awake after 15 minutes of trying to sleep. Keep the lights down, but try reading or  doing a quiet activity. When you feel sleepy, go back to bed.  Make sure that you drive carefully. Avoid driving if you feel very sleepy.  Keep all follow-up appointments as directed by your health care provider. This is important. SEEK MEDICAL CARE IF:   You are tired throughout the day or have trouble in your daily routine due to sleepiness.  You continue to have sleep problems or your sleep problems get worse. SEEK IMMEDIATE MEDICAL CARE IF:   You have serious thoughts about hurting yourself or someone else.   This information is not intended to replace advice given to you by your health care provider. Make sure you discuss any questions you have with your health care provider.   Document Released: 11/29/2000 Document Revised: 08/23/2015 Document Reviewed: 09/02/2014 Elsevier Interactive Patient Education 2016 Elsevier Inc.  

## 2016-06-12 NOTE — Assessment & Plan Note (Signed)
Consider Trazadone if persist

## 2016-06-28 ENCOUNTER — Ambulatory Visit (INDEPENDENT_AMBULATORY_CARE_PROVIDER_SITE_OTHER): Payer: Medicare HMO | Admitting: Internal Medicine

## 2016-06-28 ENCOUNTER — Encounter: Payer: Self-pay | Admitting: Internal Medicine

## 2016-06-28 VITALS — BP 120/80 | HR 78 | Temp 98.2°F | Wt 171.0 lb

## 2016-06-28 DIAGNOSIS — L821 Other seborrheic keratosis: Secondary | ICD-10-CM

## 2016-06-28 NOTE — Progress Notes (Signed)
Pre visit review using our clinic review tool, if applicable. No additional management support is needed unless otherwise documented below in the visit note. 

## 2016-06-28 NOTE — Patient Instructions (Signed)
Seborrheic Keratosis Seborrheic keratosis is a common, noncancerous (benign) skin growth. This condition causes waxy, rough, tan, brown, or black spots to appear on the skin. These skin growths can be flat or raised. CAUSES The cause of this condition is not known. RISK FACTORS This condition is more likely to develop in:  People who have a family history of seborrheic keratosis.  People who are 50 or older.  People who are pregnant.  People who have had estrogen replacement therapy. SYMPTOMS This condition often occurs on the face, chest, shoulders, back, or other areas. These growths:  Are usually painless, but may become irritated and itchy.  Can be yellow, brown, black, or other colors.  Are slightly raised or have a flat surface.  Are sometimes rough or wart-like in texture.  Are often waxy on the surface.  Are round or oval-shaped.  Sometimes look like they are "stuck on."  Often occur in groups, but may occur as a single growth. DIAGNOSIS This condition is diagnosed with a medical history and physical exam. A sample of the growth may be tested (skin biopsy). You may need to see a skin specialist (dermatologist). TREATMENT Treatment is not usually needed for this condition, unless the growths are irritated or are often bleeding. You may also choose to have the growths removed if you do not like their appearance. Most commonly, these growths are treated with a procedure in which liquid nitrogen is applied to "freeze" off the growth (cryosurgery). They may also be burned off with electricity or cut off. HOME CARE INSTRUCTIONS  Watch your growth for any changes.  Keep all follow-up visits as told by your health care provider. This is important.  Do not scratch or pick at the growth or growths. This can cause them to become irritated or infected. SEEK MEDICAL CARE IF:  You suddenly have many new growths.  Your growth bleeds, itches, or hurts.  Your growth suddenly  becomes larger or changes color.   This information is not intended to replace advice given to you by your health care provider. Make sure you discuss any questions you have with your health care provider.   Document Released: 01/04/2011 Document Revised: 08/23/2015 Document Reviewed: 04/19/2015 Elsevier Interactive Patient Education 2016 Elsevier Inc.  

## 2016-06-28 NOTE — Progress Notes (Signed)
   Subjective:    Patient ID: Danielle Nash, female    DOB: 1951/11/15, 65 y.o.   MRN: [id number]  HPI  Pt presents to the clinic today for evaluation of moles on lower left leg.  She reports a mole behind her left knee that developed about 1 year ago, and since then has gotten drier and more rough to the touch, has increased in size, and has changed color from brownish red to grey.  She notes the mole is occasionally itchy, but denies any bleeding.  She also reports the presence of two similar moles on the lower lateral left extremity, one of which has recently disappeared.    Review of Systems  Past Medical History  Diagnosis Date  . Thyroid disease   . Allergy   . Allergy-induced asthma   . Frequent headaches     Current Outpatient Prescriptions  Medication Sig Dispense Refill  . butalbital-acetaminophen-caffeine (FIORICET, ESGIC) 50-325-40 MG tablet Take by mouth as needed. Reported on 03/25/2016    . cyclobenzaprine (FLEXERIL) 5 MG tablet Take by mouth. Reported on 03/25/2016    . meclizine (ANTIVERT) 12.5 MG tablet Take by mouth. Reported on 03/25/2016     No current facility-administered medications for this visit.    Allergies  Allergen Reactions  . Erythromycin   . Oxycodone Other (See Comments)    depression  . Sulfa Antibiotics     Other reaction(s): Unknown    Family History  Problem Relation Age of Onset  . Arthritis Mother   . Diabetes Mother   . Heart disease Father   . Breast cancer Sister   . Diabetes Maternal Uncle     Social History   Social History  . Marital Status: Married    Spouse Name: N/A  . Number of Children: N/A  . Years of Education: N/A   Occupational History  . Not on file.   Social History Main Topics  . Smoking status: Never Smoker   . Smokeless tobacco: Never Used  . Alcohol Use: No     Comment: rare  . Drug Use: No  . Sexual Activity: No   Other Topics Concern  . Not on file   Social History Narrative     Skin:  Reports moles present on left lower extremity, occasional pruritis  No other specific complaints in a complete review of systems (except as listed in HPI above).      Objective:   Physical Exam BP 120/80 mmHg  Pulse 78  Temp(Src) 98.2 F (36.8 C) (Oral)  Wt 171 lb (77.565 kg)  SpO2 98%  General:  Well-appearing, appears stated age Skin: 1 cm round brown raised lesion behind left knee, Less than 0.5 cm red lesion left lateral lower extremity, Less than 0.5 cm lesion right posterior medial calf  Pulm: Clear to auscultation bilaterally CV: Regular rate and rhythm, no murmurs     Assessment & Plan:   Seborrheic keratosis:  Discussed option for referral to dermatology for removal, patient declines at this time Instructed to call if removal desired in the future  RTC as needed or if symptoms persist or worsen Renette Hsu, NP

## 2016-10-18 ENCOUNTER — Ambulatory Visit
Admission: EM | Admit: 2016-10-18 | Discharge: 2016-10-18 | Disposition: A | Discharge status: Other Acute Inpatient Hospital | Payer: Medicare HMO | Source: Home / Self Care

## 2016-10-18 ENCOUNTER — Encounter: Payer: Self-pay | Admitting: Emergency Medicine

## 2016-10-18 ENCOUNTER — Emergency Department: Payer: Medicare HMO

## 2016-10-18 ENCOUNTER — Observation Stay
Admission: EM | Admit: 2016-10-18 | Discharge: 2016-10-20 | Disposition: A | Discharge status: Home / Self Care | Payer: Medicare HMO | Attending: Surgery | Admitting: Surgery

## 2016-10-18 DIAGNOSIS — Z96653 Presence of artificial knee joint, bilateral: Secondary | ICD-10-CM | POA: Diagnosis not present

## 2016-10-18 DIAGNOSIS — R1084 Generalized abdominal pain: Secondary | ICD-10-CM

## 2016-10-18 DIAGNOSIS — K3580 Unspecified acute appendicitis: Secondary | ICD-10-CM

## 2016-10-18 DIAGNOSIS — K353 Acute appendicitis with localized peritonitis: Secondary | ICD-10-CM | POA: Diagnosis not present

## 2016-10-18 DIAGNOSIS — E039 Hypothyroidism, unspecified: Secondary | ICD-10-CM | POA: Insufficient documentation

## 2016-10-18 DIAGNOSIS — R109 Unspecified abdominal pain: Secondary | ICD-10-CM | POA: Diagnosis present

## 2016-10-18 HISTORY — DX: Irritable bowel syndrome, unspecified: K58.9

## 2016-10-18 LAB — CBC
HCT: 43.6 % (ref 35.0–47.0)
Hemoglobin: 15.2 g/dL (ref 12.0–16.0)
MCH: 30.7 pg (ref 26.0–34.0)
MCHC: 34.8 g/dL (ref 32.0–36.0)
MCV: 88.4 fL (ref 80.0–100.0)
PLATELETS: 231 10*3/uL (ref 150–440)
RBC: 4.94 MIL/uL (ref 3.80–5.20)
RDW: 13 % (ref 11.5–14.5)
WBC: 9.3 10*3/uL (ref 3.6–11.0)

## 2016-10-18 LAB — COMPREHENSIVE METABOLIC PANEL
ALT: 25 U/L (ref 14–54)
AST: 26 U/L (ref 15–41)
Albumin: 4.7 g/dL (ref 3.5–5.0)
Alkaline Phosphatase: 66 U/L (ref 38–126)
Anion gap: 13 (ref 5–15)
BILIRUBIN TOTAL: 0.7 mg/dL (ref 0.3–1.2)
BUN: 12 mg/dL (ref 6–20)
CO2: 21 mmol/L — ABNORMAL LOW (ref 22–32)
CREATININE: 0.57 mg/dL (ref 0.44–1.00)
Calcium: 10 mg/dL (ref 8.9–10.3)
Chloride: 105 mmol/L (ref 101–111)
GFR calc Af Amer: >60 mL/min (ref >60)
Glucose, Bld: 138 mg/dL — ABNORMAL HIGH (ref 65–99)
Potassium: 3.8 mmol/L (ref 3.5–5.1)
Sodium: 139 mmol/L (ref 135–145)
TOTAL PROTEIN: 7.7 g/dL (ref 6.5–8.1)

## 2016-10-18 LAB — LIPASE, BLOOD: Lipase: 22 U/L (ref 11–51)

## 2016-10-18 MED ORDER — MORPHINE SULFATE (PF) 2 MG/ML IV SOLN
INTRAVENOUS | Status: AC
  Administered 2016-10-18: 4 mg via INTRAVENOUS
  Filled 2016-10-18: qty 2

## 2016-10-18 MED ORDER — MORPHINE SULFATE (PF) 2 MG/ML IV SOLN
4.0000 mg | Freq: Once | INTRAVENOUS | Status: AC
  Administered 2016-10-18: 4 mg via INTRAVENOUS

## 2016-10-18 MED ORDER — IOPAMIDOL (ISOVUE-300) INJECTION 61%
100.0000 mL | Freq: Once | INTRAVENOUS | Status: AC | PRN
  Administered 2016-10-18: 100 mL via INTRAVENOUS

## 2016-10-18 MED ORDER — IOPAMIDOL (ISOVUE-300) INJECTION 61%
30.0000 mL | Freq: Once | INTRAVENOUS | Status: AC
  Administered 2016-10-18: 30 mL via ORAL

## 2016-10-18 MED ORDER — ONDANSETRON HCL 4 MG/2ML IJ SOLN
INTRAMUSCULAR | Status: AC
  Administered 2016-10-18: 4 mg via INTRAVENOUS
  Filled 2016-10-18: qty 2

## 2016-10-18 MED ORDER — ENOXAPARIN SODIUM 40 MG/0.4ML ~~LOC~~ SOLN
40.0000 mg | SUBCUTANEOUS | Status: DC
  Administered 2016-10-18 – 2016-10-19 (×2): 40 mg via SUBCUTANEOUS
  Filled 2016-10-18 (×2): qty 0.4

## 2016-10-18 MED ORDER — ONDANSETRON 4 MG PO TBDP: 4.0000 mg | ORAL_TABLET | Freq: Four times a day (QID) | ORAL | Status: DC | PRN

## 2016-10-18 MED ORDER — PROMETHAZINE HCL 25 MG/ML IJ SOLN
12.5000 mg | Freq: Once | INTRAMUSCULAR | Status: AC
  Administered 2016-10-18: 12.5 mg via INTRAVENOUS
  Filled 2016-10-18: qty 1

## 2016-10-18 MED ORDER — ACETAMINOPHEN 325 MG PO TABS: 650.0000 mg | ORAL_TABLET | Freq: Four times a day (QID) | ORAL | Status: DC | PRN

## 2016-10-18 MED ORDER — FAMOTIDINE 20 MG PO TABS
20.0000 mg | ORAL_TABLET | Freq: Every day | ORAL | Status: DC
  Administered 2016-10-18 – 2016-10-20 (×2): 20 mg via ORAL
  Filled 2016-10-18 (×2): qty 1

## 2016-10-18 MED ORDER — TRAMADOL HCL 50 MG PO TABS: 100.0000 mg | ORAL_TABLET | Freq: Four times a day (QID) | ORAL | Status: DC | PRN

## 2016-10-18 MED ORDER — KCL IN DEXTROSE-NACL 20-5-0.45 MEQ/L-%-% IV SOLN
INTRAVENOUS | Status: DC
  Administered 2016-10-18 – 2016-10-20 (×4): via INTRAVENOUS
  Filled 2016-10-18 (×6): qty 1000

## 2016-10-18 MED ORDER — HYDROMORPHONE HCL 1 MG/ML IJ SOLN
1.0000 mg | Freq: Once | INTRAMUSCULAR | Status: AC
  Administered 2016-10-18: 1 mg via INTRAVENOUS
  Filled 2016-10-18: qty 1

## 2016-10-18 MED ORDER — ONDANSETRON HCL 4 MG/2ML IJ SOLN
4.0000 mg | Freq: Four times a day (QID) | INTRAMUSCULAR | Status: DC | PRN
  Administered 2016-10-18: 4 mg via INTRAVENOUS
  Filled 2016-10-18: qty 2

## 2016-10-18 MED ORDER — ACETAMINOPHEN 650 MG RE SUPP: 650.0000 mg | Freq: Four times a day (QID) | RECTAL | Status: DC | PRN

## 2016-10-18 MED ORDER — HYDROMORPHONE HCL 1 MG/ML IJ SOLN
1.0000 mg | INTRAMUSCULAR | Status: DC | PRN
  Administered 2016-10-18 – 2016-10-20 (×13): 1 mg via INTRAVENOUS
  Filled 2016-10-18 (×13): qty 1

## 2016-10-18 MED ORDER — ONDANSETRON HCL 4 MG/2ML IJ SOLN
4.0000 mg | Freq: Once | INTRAMUSCULAR | Status: AC
  Administered 2016-10-18: 4 mg via INTRAVENOUS

## 2016-10-18 MED ORDER — PIPERACILLIN-TAZOBACTAM 3.375 G IVPB
3.3750 g | Freq: Three times a day (TID) | INTRAVENOUS | Status: DC
  Administered 2016-10-18 – 2016-10-20 (×5): 3.375 g via INTRAVENOUS
  Filled 2016-10-18 (×5): qty 50

## 2016-10-18 NOTE — ED Notes (Signed)
Dr. Kerman Passey notified of patient's pain. Informed him that she is unable to sit still due to the pain. Informed patient that I have a pain medication for her and she stated "give it to me now" Informed her that I had to put the order in and had to draw up the medications to administer. Pt asking when she is going to be seen and have something done.

## 2016-10-18 NOTE — ED Notes (Signed)
EMS called to transport patient to ED 

## 2016-10-18 NOTE — ED Triage Notes (Signed)
Pt reports yesterday she felt like her stomach was upset and felt tired. Pt states today her pain is worse since 1130 this morning. Pt was at [facility] to be evaluated when they called EMS. Pt is very anxious on arrival. Pt was seen in March for appendicitis and was treated with antibiotics. Pt states she has been having frequent bowel movements which are loose and states she did have an episode of bloody stool yesterday.  Pt also states she has a hx of IBS.

## 2016-10-18 NOTE — ED Triage Notes (Signed)
Patient complains of abdominal pain with dry heaves. Patient states that symptoms started suddenly at 1130am. Patient states that she has had pain similar to this with Kidney Stones.

## 2016-10-18 NOTE — ED Provider Notes (Signed)
[facility] Emergency Department Provider Note  Time seen: 3:02 PM  I have reviewed the triage vital signs and the nursing notes.   HISTORY  Chief Complaint Abdominal Pain    HPI Danielle Nash is a 65 y.o. female with a past medical history of IBS, asthma, who presents to the emergency department for diffuse abdominal pain. According to the patient around 11:30 this morning she began with diffuse abdominal pain which she now states is severe 10/10. Positive for nausea with several episodes of dry heaving this morning. States diarrhea earlier this week but denies any today.Denies any fever.  Past Medical History:  Diagnosis Date  . Allergy   . Allergy-induced asthma   . Frequent headaches   . Irritable bowel syndrome (IBS)   . Thyroid disease     Patient Active Problem List   Diagnosis Date Noted  . Thyroid disease 06/12/2016  . Allergy-induced asthma 06/12/2016  . Frequent headaches 06/12/2016  . Insomnia, persistent 10/02/2015  . Degeneration of intervertebral disc of lumbar region 05/19/2015    Past Surgical History:  Procedure Laterality Date  . MEDIAL PARTIAL KNEE REPLACEMENT Bilateral   . THYROID SURGERY      Prior to Admission medications   Medication Sig Start Date End Date Taking? Authorizing Provider  butalbital-acetaminophen-caffeine (FIORICET, ESGIC) 50-325-40 MG tablet Take by mouth as needed. Reported on 03/25/2016 10/21/14   Historical Provider, MD  cyclobenzaprine (FLEXERIL) 5 MG tablet Take by mouth. Reported on 03/25/2016    Historical Provider, MD  meclizine (ANTIVERT) 12.5 MG tablet Take by mouth. Reported on 03/25/2016    Historical Provider, MD    Allergies  Allergen Reactions  . Erythromycin   . Oxycodone Other (See Comments)    depression  . Sulfa Antibiotics     Other reaction(s): Unknown    Family History  Problem Relation Age of Onset  . Arthritis Mother   . Diabetes Mother   . Heart disease Father   . Breast  cancer Sister   . Diabetes Maternal Uncle     Social History Social History  Substance Use Topics  . Smoking status: Never Smoker  . Smokeless tobacco: Never Used  . Alcohol use No     Comment: rare    Review of Systems Constitutional: Negative for fever. Cardiovascular: Negative for chest pain. Respiratory: Negative for shortness of breath. Gastrointestinal: Negative for abdominal pain Neurological: Negative for headache 10-point ROS otherwise negative.  ____________________________________________   PHYSICAL EXAM:  VITAL SIGNS: ED Triage Vitals  Enc Vitals Group     BP 10/18/16 1433 126/77     Pulse Rate 10/18/16 1433 70     Resp 10/18/16 1433 18     Temp 10/18/16 1433 97.4 F (36.3 C)     Temp Source 10/18/16 1433 Axillary     SpO2 10/18/16 1433 100 %     Weight 10/18/16 1433 170 lb (77.1 kg)     Height 10/18/16 1433 5' (1.524 m)     Head Circumference --      Peak Flow --      Pain Score 10/18/16 1434 10     Pain Loc --      Pain Edu? --      Excl. in [place]? --     Constitutional: Alert and oriented. Mild distress due to pain. Eyes: Normal exam ENT   Head: Normocephalic and atraumatic.   Mouth/Throat: Mucous membranes are moist. Cardiovascular: Normal rate, regular rhythm. No murmur Respiratory: Normal respiratory  effort without tachypnea nor retractions. Breath sounds are clear  Gastrointestinal: Soft, moderate diffuse abdominal tenderness without rebound or guarding. No distention. Unable to identify a focal area of tenderness. Musculoskeletal: Nontender with normal range of motion in all extremities. Neurologic:  Normal speech and language. No gross focal neurologic deficits Skin:  Skin is warm, dry and intact.  Psychiatric: Mood and affect are normal. Speech and behavior are normal.   ____________________________________________   RADIOLOGY  CT consistent with acute appendicitis  ____________________________________________   INITIAL  IMPRESSION / ASSESSMENT AND PLAN / ED COURSE  Pertinent labs & imaging results that were available during my care of the patient were reviewed by me and considered in my medical decision making (see chart for details).  The patient presents the emergency department with abdominal pain, mild distress due to abdominal discomfort. States nausea and vomiting this morning. We will check labs, likely proceed with a CT abdomen/pelvis to further evaluate.  CT consistent with acute appendicitis. Patient will be admitted to surgery. Dr. Pat Patrick is currently in surgery but will be down to see the patient afterwards.  ____________________________________________   FINAL CLINICAL IMPRESSION(S) / ED DIAGNOSES  Abdominal pain Acute appendicitis   Harvest Dark, MD 10/18/16 [phone]

## 2016-10-18 NOTE — ED Provider Notes (Signed)
[facility] ____________________________________________  Time seen: Approximately 1:43 PM  I have reviewed the triage vital signs and the nursing notes.   HISTORY  Chief Complaint Abdominal Pain  HPI Danielle Nash is a 65 y.o. female presenting with a friend at bedside for the complaints of abdominal pain. Patient states abdominal pain acute onset at 11:30 this morning. Patient states pain is all over her abdomen as well as all over her back. Denies any known trigger. Reports nausea and dry heaving. Denies vomiting. Denies fevers. Denies trauma. Patient reports she had the same presentation in March of this past year and was diagnosed with appendicitis as well as a questionable kidney stone area. Patient reports her appendicitis is been treated with IV antibiotics and not surgical removal. Reports she had responded well to IV antibiotics.  Patient pacing in exam room, and reports she does not want to answer any questions.   Past Medical History:  Diagnosis Date  . Allergy   . Allergy-induced asthma   . Frequent headaches   . Thyroid disease     Patient Active Problem List   Diagnosis Date Noted  . Thyroid disease 06/12/2016  . Allergy-induced asthma 06/12/2016  . Frequent headaches 06/12/2016  . Insomnia, persistent 10/02/2015  . Degeneration of intervertebral disc of lumbar region 05/19/2015    Past Surgical History:  Procedure Laterality Date  . MEDIAL PARTIAL KNEE REPLACEMENT Bilateral   . THYROID SURGERY      Current Outpatient Rx  . Order #: [id number] Class: Historical Med  . Order #: [id number] Class: Historical Med  . Order #: [id number] Class: Historical Med    No current facility-administered medications for this encounter.   Current Outpatient Prescriptions:  .  butalbital-acetaminophen-caffeine (FIORICET, ESGIC) 50-325-40 MG tablet, Take by mouth as needed. Reported on 03/25/2016, Disp: , Rfl:  .  cyclobenzaprine (FLEXERIL) 5 MG tablet, Take by  mouth. Reported on 03/25/2016, Disp: , Rfl:  .  meclizine (ANTIVERT) 12.5 MG tablet, Take by mouth. Reported on 03/25/2016, Disp: , Rfl:   Allergies Erythromycin; Oxycodone; and Sulfa antibiotics  Family History  Problem Relation Age of Onset  . Arthritis Mother   . Diabetes Mother   . Heart disease Father   . Breast cancer Sister   . Diabetes Maternal Uncle     Social History Social History  Substance Use Topics  . Smoking status: Never Smoker  . Smokeless tobacco: Never Used  . Alcohol use No     Comment: rare    Review of Systems Constitutional: No fever/chills Eyes: No visual changes. ENT: No sore throat. Cardiovascular: Denies chest pain. Respiratory: Denies shortness of breath. Gastrointestinal:No diarrhea.  No constipation. Genitourinary: Negative for dysuria. Musculoskeletal: positive for back pain.    ____________________________________________   PHYSICAL EXAM:  VITAL SIGNS: ED Triage Vitals  Enc Vitals Group     BP 10/18/16 1328 (!) 140/104     Pulse Rate 10/18/16 1328 86     Resp 10/18/16 1328 16     Temp 10/18/16 1328 97 F (36.1 C)     Temp Source 10/18/16 1328 Tympanic     SpO2 10/18/16 1328 100 %     Weight --      Height 10/18/16 1327 5' (1.524 m)     Head Circumference --      Peak Flow --      Pain Score --      Pain Loc --      Pain Edu? --      Excl.  in [place]? --     Constitutional: Alert and oriented. Well appearing and in no acute distress. Eyes: Conjunctivae are normal. PERRL. EOMI. ENT      Head: Normocephalic and atraumatic.      Mouth/Throat: Mucous membranes are moist.Oropharynx non-erythematous. Cardiovascular: Normal rate, regular rhythm. Grossly normal heart sounds.  Good peripheral circulation. Respiratory: Normal respiratory effort without tachypnea nor retractions. Breath sounds are clear and equal bilaterally. No wheezes/rales/rhonchi.. Gastrointestinal: Soft abdomen. Moderate generalized abdominal pain, patient states  unchanged with palpation. Normal Bowel sounds. No CVA tenderness. Musculoskeletal: Ambulatory with steady gait. No midline cervical, thoracic or lumbar tenderness to palpation. Neurologic:  Normal speech and language. No gross focal neurologic deficits are appreciated. Speech is normal. No gait instability.  Skin:  Skin is warm, dry and intact. No rash noted. Psychiatric: Mood and affect are normal. Speech and behavior are normal. Patient exhibits appropriate insight and judgment   ___________________________________________   LABS (all labs ordered are listed, but only abnormal results are displayed)  Labs Reviewed - No data to display ____________________________________________   PROCEDURES Procedures    INITIAL IMPRESSION / ASSESSMENT AND PLAN / ED COURSE  Pertinent labs & imaging results that were available during my care of the patient were reviewed by me and considered in my medical decision making (see chart for details).  Patient presenting for the complaints of acute onset of generalized abdominal and back pain. Pain not increased by palpation, but reports palpation causes increased nausea. Also reports nausea and dry heaving. Patient requesting pain medication. Discussed in detail with patient recommended for patient to be seen and evaluated in emergency room at this time as we do not have on-site pain medication as well as no CT available for further imaging and evaluation. As patient has previous similar presentation for appendicitis, concern for acute appendicitis. Discussed transfer by friend at bedside versus EMS, patient opts for EMS. Patient refused Zofran. EMS at bedside. Patient stable at time of transfer.  Marya Amsler RN charge nurse at [facility] called and given report.   ____________________________________________   FINAL CLINICAL IMPRESSION(S) / ED DIAGNOSES  Final diagnoses:  Generalized abdominal pain     New Prescriptions   No medications on file    Note:  This dictation was prepared with Dragon dictation along with smaller phrase technology. Any transcriptional errors that result from this process are unintentional.    Clinical Course      Marylene Land, NP 10/18/16 1412

## 2016-10-18 NOTE — H&P (Signed)
Danielle Nash is a 65 y.o. female  with right lower quadrant pain.  HPI: She was admitted to the hospital 8 months ago with abdominal pain and a dilated appendix. Her clinical presentation was atypical for appendicitis so she was treated with intravenous antibiotics. Her symptoms resolved. She was seen in follow-up in the office with no abdominal complaints. Her graft she relates that she's had intermittent abdominal pain since that episode culminating in symptoms starting 2 days ago. The pain was primarily right lower quadrant increasing in severity with nausea and vomiting. She denies any fever or chills. She did not have any constipation or diarrhea. She did notice some blood in her stools which she has attributed to her hemorrhoids. She had a colonoscopy several years ago which was unremarkable.  She denies any history of hepatitis yellow jaundice pancreatitis diverticulitis or gallbladder disease. She does carry a diagnosis of previous peptic ulcer disease. She's had no previous abdominal surgery.  Workup in the emergency room revealed dilated appendix without evidence of significant stranding. The reading was consistent with possible appendicitis in the right clinical presentation. The surgical service was consulted.  Past Medical History:  Diagnosis Date  . Allergy   . Allergy-induced asthma   . Frequent headaches   . Irritable bowel syndrome (IBS)   . Thyroid disease    Past Surgical History:  Procedure Laterality Date  . MEDIAL PARTIAL KNEE REPLACEMENT Bilateral   . THYROID SURGERY     Social History   Social History  . Marital status: Married    Spouse name: N/A  . Number of children: N/A  . Years of education: N/A   Social History Main Topics  . Smoking status: Never Smoker  . Smokeless tobacco: Never Used  . Alcohol use No     Comment: rare  . Drug use: No  . Sexual activity: No   Other Topics Concern  . None   Social History Narrative  . None     Review of  Systems  Constitutional: Negative for chills and fever.  HENT: Negative.   Eyes: Negative.   Respiratory: Negative for cough, sputum production and wheezing.   Cardiovascular: Negative for chest pain and orthopnea.  Gastrointestinal: Positive for abdominal pain, blood in stool, nausea and vomiting. Negative for heartburn.  Genitourinary: Negative.   Musculoskeletal: Positive for back pain and neck pain.  Skin: Negative.   Neurological: Negative.   Psychiatric/Behavioral: Negative.      PHYSICAL EXAM: BP 136/78   Pulse 60   Temp 97.4 F (36.3 C) (Axillary)   Resp 15   Ht 5' (1.524 m)   Wt 77.1 kg (170 lb)   SpO2 95%   BMI 33.20 kg/m   Physical Exam  Constitutional: She is oriented to person, place, and time. She appears well-developed and well-nourished. No distress.  HENT:  Head: Normocephalic and atraumatic.  Eyes: EOM are normal. Pupils are equal, round, and reactive to light.  Neck: Normal range of motion. Neck supple.  Cardiovascular: Normal rate and regular rhythm.   Pulmonary/Chest: Effort normal and breath sounds normal.  Abdominal: Soft. Bowel sounds are normal. There is tenderness. There is guarding. There is no rebound.  Musculoskeletal: Normal range of motion. She exhibits no edema or deformity.  Neurological: She is alert and oriented to person, place, and time.  Skin: She is not diaphoretic.  Psychiatric: Her behavior is normal.   Her abdomen is moderately distended with no significant generalized tenderness. Chest point tenderness right lower quadrant  with some mild guarding but no rebound. She has hypoactive but present bowel sounds.  Impression/Plan: I have independently reviewed her CT scan. She has no elevation her white blood cell count. CT scan is about the same from her previous scan although the appendix is slightly smaller.  The patient's condition she has appendicitis and it was inadequately treated on her last admission. In view of her current  symptoms questionable previous appendicitis the persistent abdominal pain and her current CT findings we will consider surgical intervention. I do not see any indication for urgent surgical intervention so we will put her on the surgical schedule for tomorrow for laparoscopic appendectomy. She is in agreement with this plan.   Dia Crawford III, MD  10/18/2016, 6:41 PM

## 2016-10-19 ENCOUNTER — Encounter
Admission: EM | Disposition: A | Discharge status: Home / Self Care | Payer: Self-pay | Source: Home / Self Care | Attending: Emergency Medicine

## 2016-10-19 ENCOUNTER — Encounter: Payer: Self-pay | Admitting: Anesthesiology

## 2016-10-19 ENCOUNTER — Observation Stay: Payer: Medicare HMO | Admitting: Anesthesiology

## 2016-10-19 DIAGNOSIS — K3580 Unspecified acute appendicitis: Secondary | ICD-10-CM | POA: Diagnosis not present

## 2016-10-19 HISTORY — PX: LAPAROSCOPIC APPENDECTOMY: SHX408

## 2016-10-19 LAB — BASIC METABOLIC PANEL
ANION GAP: 6 (ref 5–15)
BUN: 9 mg/dL (ref 6–20)
CHLORIDE: 100 mmol/L — AB (ref 101–111)
CO2: 26 mmol/L (ref 22–32)
Calcium: 8.8 mg/dL — ABNORMAL LOW (ref 8.9–10.3)
Creatinine, Ser: 0.45 mg/dL (ref 0.44–1.00)
GFR calc Af Amer: >60 mL/min (ref >60)
GLUCOSE: 118 mg/dL — AB (ref 65–99)
POTASSIUM: 3.9 mmol/L (ref 3.5–5.1)
Sodium: 132 mmol/L — ABNORMAL LOW (ref 135–145)

## 2016-10-19 LAB — MRSA PCR SCREENING: MRSA by PCR: NEGATIVE

## 2016-10-19 LAB — CBC
HEMATOCRIT: 38.1 % (ref 35.0–47.0)
HEMOGLOBIN: 13.3 g/dL (ref 12.0–16.0)
MCH: 31 pg (ref 26.0–34.0)
MCHC: 34.9 g/dL (ref 32.0–36.0)
MCV: 88.7 fL (ref 80.0–100.0)
Platelets: 190 10*3/uL (ref 150–440)
RBC: 4.29 MIL/uL (ref 3.80–5.20)
RDW: 13.2 % (ref 11.5–14.5)
WBC: 10.3 10*3/uL (ref 3.6–11.0)

## 2016-10-19 SURGERY — APPENDECTOMY, LAPAROSCOPIC
Anesthesia: General

## 2016-10-19 MED ORDER — ONDANSETRON HCL 4 MG/2ML IJ SOLN: 4.0000 mg | Freq: Once | INTRAMUSCULAR | Status: DC | PRN

## 2016-10-19 MED ORDER — FENTANYL CITRATE (PF) 100 MCG/2ML IJ SOLN
25.0000 ug | INTRAMUSCULAR | Status: DC | PRN
  Administered 2016-10-19: 25 ug via INTRAVENOUS
  Administered 2016-10-19: 16:00:00 via INTRAVENOUS

## 2016-10-19 MED ORDER — DEXAMETHASONE SODIUM PHOSPHATE 10 MG/ML IJ SOLN
INTRAMUSCULAR | Status: DC | PRN
  Administered 2016-10-19: 10 mg via INTRAVENOUS

## 2016-10-19 MED ORDER — LIDOCAINE HCL (CARDIAC) 20 MG/ML IV SOLN
INTRAVENOUS | Status: DC | PRN
  Administered 2016-10-19: 100 mg via INTRAVENOUS

## 2016-10-19 MED ORDER — SUCCINYLCHOLINE CHLORIDE 20 MG/ML IJ SOLN
INTRAMUSCULAR | Status: DC | PRN
  Administered 2016-10-19: 100 mg via INTRAVENOUS

## 2016-10-19 MED ORDER — FENTANYL CITRATE (PF) 100 MCG/2ML IJ SOLN
INTRAMUSCULAR | Status: DC | PRN
  Administered 2016-10-19: 100 ug via INTRAVENOUS
  Administered 2016-10-19: 50 ug via INTRAVENOUS

## 2016-10-19 MED ORDER — BUPIVACAINE HCL (PF) 0.25 % IJ SOLN
INTRAMUSCULAR | Status: AC
  Filled 2016-10-19: qty 30

## 2016-10-19 MED ORDER — ONDANSETRON HCL 4 MG/2ML IJ SOLN
INTRAMUSCULAR | Status: DC | PRN
  Administered 2016-10-19: 4 mg via INTRAVENOUS

## 2016-10-19 MED ORDER — MIDAZOLAM HCL 2 MG/2ML IJ SOLN
INTRAMUSCULAR | Status: DC | PRN
  Administered 2016-10-19: 2 mg via INTRAVENOUS

## 2016-10-19 MED ORDER — HEPARIN SODIUM (PORCINE) 5000 UNIT/ML IJ SOLN
INTRAMUSCULAR | Status: AC
  Filled 2016-10-19: qty 1

## 2016-10-19 MED ORDER — LACTATED RINGERS IV SOLN
INTRAVENOUS | Status: DC | PRN
  Administered 2016-10-19: 14:00:00 via INTRAVENOUS

## 2016-10-19 MED ORDER — FENTANYL CITRATE (PF) 100 MCG/2ML IJ SOLN
INTRAMUSCULAR | Status: AC
  Filled 2016-10-19: qty 2

## 2016-10-19 MED ORDER — PROPOFOL 10 MG/ML IV BOLUS
INTRAVENOUS | Status: DC | PRN
  Administered 2016-10-19: 80 mg via INTRAVENOUS
  Administered 2016-10-19: 120 mg via INTRAVENOUS

## 2016-10-19 MED ORDER — BUPIVACAINE HCL 0.25 % IJ SOLN
INTRAMUSCULAR | Status: DC | PRN
  Administered 2016-10-19: 30 mL

## 2016-10-19 SURGICAL SUPPLY — 35 items
CANISTER SUCT 1200ML W/VALVE (MISCELLANEOUS) ×3 IMPLANT
CANISTER SUCT 3000ML (MISCELLANEOUS) IMPLANT
CHLORAPREP W/TINT 26ML (MISCELLANEOUS) ×3 IMPLANT
CUTTER FLEX LINEAR 45M (STAPLE) ×3 IMPLANT
DRSG TEGADERM 2-3/8X2-3/4 SM (GAUZE/BANDAGES/DRESSINGS) ×9 IMPLANT
DRSG TELFA 3X8 NADH (GAUZE/BANDAGES/DRESSINGS) ×3 IMPLANT
ELECT REM PT RETURN 9FT ADLT (ELECTROSURGICAL) ×3
ELECTRODE REM PT RTRN 9FT ADLT (ELECTROSURGICAL) ×1 IMPLANT
GLOVE BIO SURGEON STRL SZ7.5 (GLOVE) ×9 IMPLANT
GLOVE INDICATOR 8.0 STRL GRN (GLOVE) ×3 IMPLANT
GOWN STRL REUS W/ TWL LRG LVL3 (GOWN DISPOSABLE) ×2 IMPLANT
GOWN STRL REUS W/TWL LRG LVL3 (GOWN DISPOSABLE) ×4
GRASPER SUT TROCAR 14GX15 (MISCELLANEOUS) IMPLANT
IRRIGATION STRYKERFLOW (MISCELLANEOUS) ×1 IMPLANT
IRRIGATOR STRYKERFLOW (MISCELLANEOUS) ×3
IV NS 1000ML (IV SOLUTION) ×2
IV NS 1000ML BAXH (IV SOLUTION) ×1 IMPLANT
KIT RM TURNOVER STRD PROC AR (KITS) ×3 IMPLANT
NEEDLE FILTER BLUNT 18X 1/2SAF (NEEDLE) ×2
NEEDLE FILTER BLUNT 18X1 1/2 (NEEDLE) ×1 IMPLANT
NEEDLE HYPO 25X1 1.5 SAFETY (NEEDLE) ×3 IMPLANT
NEEDLE INSUFFLATION 14GA 120MM (NEEDLE) ×3 IMPLANT
NS IRRIG 500ML POUR BTL (IV SOLUTION) ×3 IMPLANT
PACK LAP CHOLECYSTECTOMY (MISCELLANEOUS) ×3 IMPLANT
POUCH ENDO CATCH 10MM SPEC (MISCELLANEOUS) ×3 IMPLANT
RELOAD 45 VASCULAR/THIN (ENDOMECHANICALS) ×12 IMPLANT
SCISSORS METZENBAUM CVD 33 (INSTRUMENTS) ×3 IMPLANT
SOLIDIFIER ABSORB 1200ML (MISCELLANEOUS) IMPLANT
SUT ETHILON 5-0 FS-2 18 BLK (SUTURE) ×6 IMPLANT
SUT VIC AB 0 CT2 27 (SUTURE) ×3 IMPLANT
SYRINGE 10CC LL (SYRINGE) ×3 IMPLANT
TROCAR XCEL 12X100 BLDLESS (ENDOMECHANICALS) ×3 IMPLANT
TROCAR Z-THREAD FIOS 11X100 BL (TROCAR) ×3 IMPLANT
TROCAR Z-THREAD SLEEVE 11X100 (TROCAR) ×3 IMPLANT
TUBING INSUFFLATOR HI FLOW (MISCELLANEOUS) ×3 IMPLANT

## 2016-10-19 NOTE — Care Management Obs Status (Signed)
[facility] NOTIFICATION   Patient Details  Name: LIMA SQUARE MRN: [id number] Date of Birth: May 14, 1951   Medicare Observation Status Notification Given:  Yes    CrutchfieldAntony Haste, RN 10/19/2016, 7:28 PM

## 2016-10-19 NOTE — Anesthesia Procedure Notes (Signed)
Procedure Name: Intubation Date/Time: 10/19/2016 1:59 PM Performed by: Clinton Sawyer Pre-anesthesia Checklist: Patient identified, Emergency Drugs available, Suction available, Patient being monitored and Timeout performed Patient Re-evaluated:Patient Re-evaluated prior to inductionOxygen Delivery Method: Circle system utilized Preoxygenation: Pre-oxygenation with 100% oxygen Intubation Type: Rapid sequence, IV induction and Cricoid Pressure applied Laryngoscope Size: Mac and 4 Grade View: Grade I Tube type: Oral Tube size: 7.0 mm Number of attempts: 1 Airway Equipment and Method: Stylet Placement Confirmation: ETT inserted through vocal cords under direct vision,  positive ETCO2,  CO2 detector and breath sounds checked- equal and bilateral Secured at: 22 cm Tube secured with: Tape Dental Injury: Teeth and Oropharynx as per pre-operative assessment

## 2016-10-19 NOTE — Anesthesia Preprocedure Evaluation (Addendum)
Anesthesia Evaluation  Patient identified by MRN, date of birth, ID band Patient awake    Reviewed: Allergy & Precautions, NPO status , Patient's Chart, lab work & pertinent test results  Airway Mallampati: III  TM Distance: <3 FB     Dental  (+) Caps   Pulmonary asthma ,    Pulmonary exam normal        Cardiovascular negative cardio ROS Normal cardiovascular exam     Neuro/Psych  Headaches, negative psych ROS   GI/Hepatic Neg liver ROS, Acute appendix   Endo/Other  Hypothyroidism   Renal/GU negative Renal ROS  negative genitourinary   Musculoskeletal  (+) Arthritis , Osteoarthritis,    Abdominal Normal abdominal exam  (+)   Peds negative pediatric ROS (+)  Hematology negative hematology ROS (+)   Anesthesia Other Findings   Reproductive/Obstetrics                            Anesthesia Physical Anesthesia Plan  ASA: II and emergent  Anesthesia Plan: General   Post-op Pain Management:    Induction: Intravenous  Airway Management Planned: Oral ETT  Additional Equipment:   Intra-op Plan:   Post-operative Plan: Extubation in OR  Informed Consent: I have reviewed the patients History and Physical, chart, labs and discussed the procedure including the risks, benefits and alternatives for the proposed anesthesia with the patient or authorized representative who has indicated his/her understanding and acceptance.   Dental advisory given  Plan Discussed with: CRNA and Surgeon  Anesthesia Plan Comments:         Anesthesia Quick Evaluation

## 2016-10-19 NOTE — Progress Notes (Signed)

## 2016-10-19 NOTE — Transfer of Care (Signed)
Immediate Anesthesia Transfer of Care Note  Patient: Danielle Nash  Procedure(s) Performed: Procedure(s): APPENDECTOMY LAPAROSCOPIC (N/A)  Patient Location: PACU  Anesthesia Type:General  Level of Consciousness: sedated  Airway & Oxygen Therapy: Patient Spontanous Breathing and Patient connected to face mask oxygen  Post-op Assessment: Report given to RN and Post -op Vital signs reviewed and stable  Post vital signs: Reviewed and stable  Last Vitals:  Vitals:   10/19/16 0052 10/19/16 0602  BP: 111/66 118/63  Pulse: 88 80  Resp: 17 18  Temp: 36.7 C 36.7 C    Last Pain:  Vitals:   10/19/16 1200  TempSrc:   PainSc: Asleep      Patients Stated Pain Goal: 0 ([id number])  Complications: No apparent anesthesia complications

## 2016-10-19 NOTE — Op Note (Signed)
10/18/2016 - 10/19/2016  2:45 PM  PATIENT:  Danielle Nash  65 y.o. female  PRE-OPERATIVE DIAGNOSIS:  appendicitis  POST-OPERATIVE DIAGNOSIS:  same  PROCEDURE:  Procedure(s): APPENDECTOMY LAPAROSCOPIC (N/A)  SURGEON:  Surgeon(s) and Role:    * Dia Crawford III, MD - Primary   ASSISTANTS: none   ANESTHESIA:   general  EBL:  Total I/O In: 364 [I.V.:339; IV Piggyback:25] Out: 5 [Blood:5]   DRAINS: none   LOCAL MEDICATIONS USED:  NONE   DISPOSITION OF SPECIMEN:  PATHOLOGY   DICTATION: .Dragon Dictation with the patient supine position and after induction appropriate general anesthesia the patient's abdomen was prepped ChloraPrep and draped sterile towels. Patient placed in the headdown feet up position. Small infraumbilical incision was made standard fashion carried down bluntly through subcutaneous tissue. A varies needle was used cannulate peritoneal cavity. CO2 was insufflated to appropriate pressure measurements. When approximately 2 L of CO2 were instilled a varies needle was withdrawn and 11 mm port inserted and the peritoneal cavity. Intraperitoneal position was confirmed and CO2 was reinsufflated.  The patient rotated slightly to the left side. A midepigastric transverse incision was made and a left millimeter port inserted under direct vision. The right lower quadrant was investigated. The appendix is visualized didn't appear to have a necrotic inflamed center section which had not perforated. The base appendix. Free of disease. Suprapubic incision was made and a 12 mm port inserted under direct vision. Camera was moved to the upper port and dissection carried out through the 2 lower ports.Appendix was identified and a window created behind the appendix. The appendiceal stump was divided with single application of the Endo GIA stapling device carrying a blue load taking it down on a corner cecum. Mesoappendix and divided with 3 applications of the Endo GIA stapling device carrying  a white load. Appendix was captured Endo Catch apparatus and removed. The area was copiously suctioned with warm saline solution. The lower midline incision was closed in suprapubic area using the needle passer with 0 Vicryl. The abdomen was then desufflated. All ports withdrawn without difficulty. Skin incisions were closed with 5-0 nylon. The area was infiltrated with 0.25% Marcaine for postoperative pain control. Sterile dressings were applied. Patient was returned recovery room having tolerated the procedure well. Sponge instrument needle count were correct 2 in the operating room.  PLAN OF CARE: Admit to inpatient   PATIENT DISPOSITION:  PACU - hemodynamically stable.   Dia Crawford III, MD

## 2016-10-20 LAB — CBC
HCT: 36.5 % (ref 35.0–47.0)
Hemoglobin: 12.6 g/dL (ref 12.0–16.0)
MCH: 31.3 pg (ref 26.0–34.0)
MCHC: 34.6 g/dL (ref 32.0–36.0)
MCV: 90.4 fL (ref 80.0–100.0)
PLATELETS: 180 10*3/uL (ref 150–440)
RBC: 4.03 MIL/uL (ref 3.80–5.20)
RDW: 13.2 % (ref 11.5–14.5)
WBC: 9.5 10*3/uL (ref 3.6–11.0)

## 2016-10-20 MED ORDER — HYDROCODONE-ACETAMINOPHEN 5-325 MG PO TABS
2.0000 | ORAL_TABLET | Freq: Four times a day (QID) | ORAL | Status: DC | PRN
  Administered 2016-10-20: 2 via ORAL
  Filled 2016-10-20: qty 2

## 2016-10-20 MED ORDER — HYDROCODONE-ACETAMINOPHEN 5-325 MG PO TABS: 2.0000 | ORAL_TABLET | Freq: Four times a day (QID) | ORAL | 0 refills | Status: DC | PRN

## 2016-10-20 NOTE — Discharge Instructions (Signed)
Appendicitis Appendicitis is when the appendix is swollen (inflamed). The inflammation can lead to developing a hole (perforation) and a collection of pus (abscess). CAUSES  There is not always an obvious cause of appendicitis. Sometimes it is caused by an obstruction in the appendix. The obstruction can be caused by:  A small, hard, pea-sized ball of stool (fecalith).  Enlarged lymph glands in the appendix. SYMPTOMS   Pain around your belly button (navel) that moves toward your lower right belly (abdomen). The pain can become more severe and sharp as time passes.  Tenderness in the lower right abdomen. Pain gets worse if you cough or make a sudden movement.  Feeling sick to your stomach (nauseous).  Throwing up (vomiting).  Loss of appetite.  Fever.  Constipation.  Diarrhea.  Generally not feeling well. DIAGNOSIS   Physical exam.  Blood tests.  Urine test.  X-rays or a CT scan may confirm the diagnosis. TREATMENT  Once the diagnosis of appendicitis is made, the most common treatment is to remove the appendix as soon as possible. This procedure is called appendectomy. In an open appendectomy, a cut (incision) is made in the lower right abdomen and the appendix is removed. In a laparoscopic appendectomy, usually 3 small incisions are made. Long, thin instruments and a camera tube are used to remove the appendix. Most patients go home in 24 to 48 hours after appendectomy. In some situations, the appendix may have already perforated and an abscess may have formed. The abscess may have a "wall" around it as seen on a CT scan. In this case, a drain may be placed into the abscess to remove fluid, and you may be treated with antibiotic medicines that kill germs. The medicine is given through a tube in your vein (IV). Once the abscess has resolved, it may or may not be necessary to have an appendectomy. You may need to stay in the hospital longer than 48 hours.   This information is  not intended to replace advice given to you by your health care provider. Make sure you discuss any questions you have with your health care provider.   Document Released: 12/02/2005 Document Revised: 06/02/2012 Document Reviewed: 04/19/2015 Elsevier Interactive Patient Education 2016 [employer] Laparoscopic Appendectomy  Care After    These instructions give you information on caring for yourself after your procedure. Your doctor may also give you more specific instructions. Call your doctor if you have any problems or questions after your procedure.  HOME CARE  Do not drive while taking pain medicine (narcotics).  Take medicine (stool softener) if you cannot poop (constipated).  Change your bandages (dressings) as told by your doctor.  Keep your wounds clean and dry. Wash the wounds gently with soap and water. Gently pat the wounds dry with a clean towel.  Do not take baths, swim, or use hot tubs for 10 days, or as told by your doctor.  Only take medicine as told by your doctor.  Continue your normal diet as told by your doctor.  Do not lift more than 10 pounds (4.5 kilograms) or play contact sports for 3 weeks, or as told by your doctor.  Slowly increase your activity.  Take deep breaths to avoid a lung infection (pneumonia). GET HELP RIGHT AWAY IF:  You have a fever >101 You have a rash.  You have trouble breathing or sharp chest pain.  You have a reaction to the medicine you are taking.  Your wound is red, puffy (swollen), or  painful.  You have yellowish-white fluid (pus) coming from the wound.  You have fluid coming from the wound for longer than 1 day.  You notice a bad smell coming from the wound or bandage.  Your wound breaks open after stitches (sutures) or staples are removed.  You have pain in the shoulders or shoulder blades.   You are short of breath.  You feel sick to your stomach (nauseous) or throw up (vomit).  MAKE SURE YOU:  Understand these instructions.    Will watch your condition.  Will get help right away if you are not doing well or get worse.

## 2016-10-20 NOTE — Progress Notes (Signed)
Subjective:   She feels better this morning. She's not nauseated she's tolerating a soft diet. She has no real complaints of some moderate incisional pain. She has not been up and active so far since surgery.  Vital signs in last 24 hours: Temp:  [97 F (36.1 C)-98.4 F (36.9 C)] 98 F (36.7 C) (11/05 0517) Pulse Rate:  [65-84] 65 (11/05 0517) Resp:  [12-19] 17 (11/05 0517) BP: (111-134)/(57-80) 130/68 (11/05 0517) SpO2:  [88 %-98 %] 98 % (11/05 0517) Last BM Date: 10/18/16  Intake/Output from previous day: 11/04 0701 - 11/05 0700 In: 2990.2 [P.O.:1050; I.V.:1884.8; IV Piggyback:55.3] Out: 2030 [Urine:2025; Blood:5]  Exam:  Abdomen looks good with no significant abdominal problems. She has active bowel sounds.  Lab Results:  CBC  Recent Labs  10/19/16 0540 10/20/16 0502  WBC 10.3 9.5  HGB 13.3 12.6  HCT 38.1 36.5  PLT 190 180   CMP     Component Value Date/Time   NA 132 (L) 10/19/2016 0540   K 3.9 10/19/2016 0540   CL 100 (L) 10/19/2016 0540   CO2 26 10/19/2016 0540   GLUCOSE 118 (H) 10/19/2016 0540   BUN 9 10/19/2016 0540   CREATININE 0.45 10/19/2016 0540   CALCIUM 8.8 (L) 10/19/2016 0540   PROT 7.7 10/18/2016 1443   ALBUMIN 4.7 10/18/2016 1443   AST 26 10/18/2016 1443   ALT 25 10/18/2016 1443   ALKPHOS 66 10/18/2016 1443   BILITOT 0.7 10/18/2016 1443   GFRNONAA >60 10/19/2016 0540   GFRAA >60 10/19/2016 0540   PT/INR No results for input(s): LABPROT, INR in the last 72 hours.  Studies/Results: Ct Abdomen Pelvis W Contrast  Result Date: 10/18/2016 CLINICAL DATA:  Diffuse abdominal pain beginning at 11:30 this morning. Recent appendicitis treated medically. EXAM: CT ABDOMEN AND PELVIS WITH CONTRAST TECHNIQUE: Multidetector CT imaging of the abdomen and pelvis was performed using the standard protocol following bolus administration of intravenous contrast. CONTRAST:  8mL ISOVUE-300 IOPAMIDOL (ISOVUE-300) INJECTION 61%, 118mL ISOVUE-300 IOPAMIDOL  (ISOVUE-300) INJECTION 61% COMPARISON:  CT abdomen and pelvis March 11, 2016 FINDINGS: LOWER CHEST: Bibasilar atelectasis. Heart size is normal. Mild coronary artery calcifications. No pericardial effusion. Reflux in the thoracic esophagus compatible with retained versus refluxed contrast. Small RIGHT diaphragmatic lipoma. HEPATOBILIARY: Liver and gallbladder are normal. PANCREAS: Normal. SPLEEN: Normal. ADRENALS/URINARY TRACT: Kidneys are orthotopic, demonstrating symmetric enhancement. Too small to characterize hypodensities in the kidneys bilaterally. Stable 2 mm LEFT interpolar nephrolithiasis. No hydronephrosis or solid renal masses. The unopacified ureters are normal in course and caliber. Delayed imaging through the kidneys demonstrates symmetric prompt contrast excretion within the proximal urinary collecting system. Urinary bladder is well distended and unremarkable. Normal adrenal glands. STOMACH/BOWEL: 12 mm appendix with mild periappendiceal inflammation. No appendicolith on today's examination. The stomach, small and large bowel are normal in course and caliber without inflammatory changes. Mild sigmoid diverticulosis. Proximal duodenum diverticulum. VASCULAR/LYMPHATIC: Aortoiliac vessels are normal in course and caliber, trace calcific atherosclerosis. No lymphadenopathy by CT size criteria. REPRODUCTIVE: Normal. OTHER: No intraperitoneal free fluid or free air. MUSCULOSKELETAL: Nonacute. Severe pubic symphyseal osteoarthrosis. Severe L3-4 degenerative disc. Osteopenia. IMPRESSION: Mild appendicitis, given prior CT findings this could be chronic. No residual appendicolith. 2 mm nonobstructing LEFT nephrolithiasis. Electronically Signed   By: Elon Alas M.D.   On: 10/18/2016 16:13    Assessment/Plan: Overall she's doing well. Her white blood cell count is back toward normal. She's only having minimal incisional pain. We'll get her up out of bed more active  we will consider discharge later  this afternoon. She is in agreement

## 2016-10-20 NOTE — Discharge Summary (Signed)
Patient ID: Danielle Nash MRN: [id number] DOB/AGE: Apr 22, 1951 65 y.o.  Admit date: 10/18/2016 Discharge date: 10/20/2016  Discharge Diagnoses:  Acute appendicitis  Procedures Performed: Laparoscopic appendectomygood  Discharged Condition: good  Hospital Course: She was admitted with signs and symptoms consistent with acute appendicitis. She had a similar episode in March 2017 but accepted antibiotic therapy at that time declined surgery. She continued to have intermittent problems over the last several months. Her symptoms intensify on the day prior to admission. CT scan suggested acute appendicitis. She was taken to surgery on the following morning which on her laparoscopic appendectomy. The procedure was uncomplicated. She did have evidence of acute appendicitis. Procedure was uncomplicated. Stable tolerating a regular diet is a pain living without difficulty. She be discharged home this evening to be found the office in 7-10 days time. Bathing activity driving instructions were given the patient.  Discharge Orders: Discharge Instructions    Diet - low sodium heart healthy    Complete by:  As directed    Driving Restrictions    Complete by:  As directed    Do not drive alt taking pain medicine   Increase activity slowly    Complete by:  As directed    Lifting restrictions    Complete by:  As directed    Do not lift anything bigger than your dinner plate   Remove dressing in 24 hours    Complete by:  As directed       Disposition: 02-Transferred to [facility]  Discharge Medications:  Current Facility-Administered Medications:  .  acetaminophen (TYLENOL) tablet 650 mg, 650 mg, Oral, Q6H PRN **OR** acetaminophen (TYLENOL) suppository 650 mg, 650 mg, Rectal, Q6H PRN, Dia Crawford III, MD .  enoxaparin (LOVENOX) injection 40 mg, 40 mg, Subcutaneous, Q24H, Dia Crawford III, MD, 40 mg at 10/19/16 2135 .  famotidine (PEPCID) tablet 20 mg, 20 mg, Oral, Daily, Dia Crawford III, MD, 20  mg at 10/20/16 0955 .  HYDROcodone-acetaminophen (NORCO/VICODIN) 5-325 MG per tablet 2 tablet, 2 tablet, Oral, Q6H PRN, Dia Crawford III, MD, 2 tablet at 10/20/16 0955 .  HYDROmorphone (DILAUDID) injection 1 mg, 1 mg, Intravenous, Q2H PRN, Dia Crawford III, MD, 1 mg at 10/20/16 0516 .  ondansetron (ZOFRAN-ODT) disintegrating tablet 4 mg, 4 mg, Oral, Q6H PRN **OR** [DISCONTINUED] ondansetron (ZOFRAN) injection 4 mg, 4 mg, Intravenous, Q6H PRN, Dia Crawford III, MD, 4 mg at 10/18/16 1914 .  piperacillin-tazobactam (ZOSYN) IVPB 3.375 g, 3.375 g, Intravenous, Q8H, Dia Crawford III, MD, 3.375 g at 10/20/16 0510 .  traMADol (ULTRAM) tablet 100 mg, 100 mg, Oral, Q6H PRN, Dia Crawford III, MD  Follwup: Follow-up Information    [facility] Follow up in 1 week(s).   Contact information: [address] [place] [id number]          Signed: Dia Crawford III 10/20/2016, 3:26 PM

## 2016-10-20 NOTE — Anesthesia Postprocedure Evaluation (Signed)
Anesthesia Post Note  Patient: Danielle Nash  Procedure(s) Performed: Procedure(s) (LRB): APPENDECTOMY LAPAROSCOPIC (N/A)  Patient location during evaluation: PACU Anesthesia Type: General Level of consciousness: awake and alert and oriented Pain management: pain level controlled Vital Signs Assessment: post-procedure vital signs reviewed and stable Respiratory status: spontaneous breathing Cardiovascular status: blood pressure returned to baseline Anesthetic complications: no    Last Vitals:  Vitals:   10/19/16 1649 10/19/16 2113  BP: (!) 111/57 122/69  Pulse: 76 84  Resp: 18 18  Temp: 36.9 C 36.9 C    Last Pain:  Vitals:   10/20/16 0034  TempSrc:   PainSc: 3                  Kada Friesen

## 2016-10-21 ENCOUNTER — Encounter: Payer: Self-pay | Admitting: Surgery

## 2016-10-22 ENCOUNTER — Other Ambulatory Visit: Payer: Self-pay

## 2016-10-22 LAB — SURGICAL PATHOLOGY

## 2016-10-25 ENCOUNTER — Encounter: Payer: Self-pay | Admitting: Surgery

## 2016-10-25 ENCOUNTER — Ambulatory Visit (INDEPENDENT_AMBULATORY_CARE_PROVIDER_SITE_OTHER): Payer: Medicare HMO | Admitting: Surgery

## 2016-10-25 VITALS — BP 151/85 | HR 61 | Temp 98.5°F | Wt 173.0 lb

## 2016-10-25 DIAGNOSIS — Z09 Encounter for follow-up examination after completed treatment for conditions other than malignant neoplasm: Secondary | ICD-10-CM

## 2016-10-25 NOTE — Patient Instructions (Signed)

## 2016-10-25 NOTE — Progress Notes (Signed)
S/p lap appy by Dr. Pat Patrick Path d/w pt Has some pain but overall she is improving Taking PO, afebrile, + BM  PE NAD Abd: soft, appropriate incisional tenderness, ecchymosis on periumbilical area, no infection. Stitches removed No peritonitis  A/P Doing well No heavy lifting RTC prn

## 2016-12-12 DIAGNOSIS — M9903 Segmental and somatic dysfunction of lumbar region: Secondary | ICD-10-CM | POA: Diagnosis not present

## 2016-12-12 DIAGNOSIS — M546 Pain in thoracic spine: Secondary | ICD-10-CM | POA: Diagnosis not present

## 2016-12-12 DIAGNOSIS — M531 Cervicobrachial syndrome: Secondary | ICD-10-CM | POA: Diagnosis not present

## 2016-12-12 DIAGNOSIS — M9902 Segmental and somatic dysfunction of thoracic region: Secondary | ICD-10-CM | POA: Diagnosis not present

## 2016-12-12 DIAGNOSIS — M5136 Other intervertebral disc degeneration, lumbar region: Secondary | ICD-10-CM | POA: Diagnosis not present

## 2016-12-25 DIAGNOSIS — J111 Influenza due to unidentified influenza virus with other respiratory manifestations: Secondary | ICD-10-CM | POA: Diagnosis not present

## 2016-12-25 DIAGNOSIS — R509 Fever, unspecified: Secondary | ICD-10-CM | POA: Diagnosis not present

## 2016-12-25 DIAGNOSIS — B9789 Other viral agents as the cause of diseases classified elsewhere: Secondary | ICD-10-CM | POA: Diagnosis not present

## 2016-12-25 DIAGNOSIS — Z1159 Encounter for screening for other viral diseases: Secondary | ICD-10-CM | POA: Diagnosis not present

## 2017-01-15 DIAGNOSIS — M9902 Segmental and somatic dysfunction of thoracic region: Secondary | ICD-10-CM | POA: Diagnosis not present

## 2017-01-15 DIAGNOSIS — M531 Cervicobrachial syndrome: Secondary | ICD-10-CM | POA: Diagnosis not present

## 2017-01-15 DIAGNOSIS — M5136 Other intervertebral disc degeneration, lumbar region: Secondary | ICD-10-CM | POA: Diagnosis not present

## 2017-01-15 DIAGNOSIS — M9903 Segmental and somatic dysfunction of lumbar region: Secondary | ICD-10-CM | POA: Diagnosis not present

## 2017-01-15 DIAGNOSIS — M546 Pain in thoracic spine: Secondary | ICD-10-CM | POA: Diagnosis not present

## 2017-02-12 DIAGNOSIS — M9903 Segmental and somatic dysfunction of lumbar region: Secondary | ICD-10-CM | POA: Diagnosis not present

## 2017-02-12 DIAGNOSIS — M9902 Segmental and somatic dysfunction of thoracic region: Secondary | ICD-10-CM | POA: Diagnosis not present

## 2017-02-12 DIAGNOSIS — M546 Pain in thoracic spine: Secondary | ICD-10-CM | POA: Diagnosis not present

## 2017-02-12 DIAGNOSIS — M5136 Other intervertebral disc degeneration, lumbar region: Secondary | ICD-10-CM | POA: Diagnosis not present

## 2017-02-12 DIAGNOSIS — M531 Cervicobrachial syndrome: Secondary | ICD-10-CM | POA: Diagnosis not present

## 2017-03-12 DIAGNOSIS — M9903 Segmental and somatic dysfunction of lumbar region: Secondary | ICD-10-CM | POA: Diagnosis not present

## 2017-03-12 DIAGNOSIS — M531 Cervicobrachial syndrome: Secondary | ICD-10-CM | POA: Diagnosis not present

## 2017-03-12 DIAGNOSIS — M9902 Segmental and somatic dysfunction of thoracic region: Secondary | ICD-10-CM | POA: Diagnosis not present

## 2017-03-12 DIAGNOSIS — M546 Pain in thoracic spine: Secondary | ICD-10-CM | POA: Diagnosis not present

## 2017-03-12 DIAGNOSIS — M5136 Other intervertebral disc degeneration, lumbar region: Secondary | ICD-10-CM | POA: Diagnosis not present

## 2017-05-01 DIAGNOSIS — M85841 Other specified disorders of bone density and structure, right hand: Secondary | ICD-10-CM | POA: Diagnosis not present

## 2017-05-01 DIAGNOSIS — M25462 Effusion, left knee: Secondary | ICD-10-CM | POA: Diagnosis not present

## 2017-05-01 DIAGNOSIS — M25531 Pain in right wrist: Secondary | ICD-10-CM | POA: Diagnosis not present

## 2017-05-01 DIAGNOSIS — W19XXXA Unspecified fall, initial encounter: Secondary | ICD-10-CM | POA: Diagnosis not present

## 2017-05-01 DIAGNOSIS — Q2546 Tortuous aortic arch: Secondary | ICD-10-CM | POA: Diagnosis not present

## 2017-05-01 DIAGNOSIS — S299XXA Unspecified injury of thorax, initial encounter: Secondary | ICD-10-CM | POA: Diagnosis not present

## 2017-05-01 DIAGNOSIS — S80212A Abrasion, left knee, initial encounter: Secondary | ICD-10-CM | POA: Diagnosis not present

## 2017-05-01 DIAGNOSIS — S6991XA Unspecified injury of right wrist, hand and finger(s), initial encounter: Secondary | ICD-10-CM | POA: Diagnosis not present

## 2017-05-01 DIAGNOSIS — W1839XA Other fall on same level, initial encounter: Secondary | ICD-10-CM | POA: Diagnosis not present

## 2017-05-01 DIAGNOSIS — Z96653 Presence of artificial knee joint, bilateral: Secondary | ICD-10-CM | POA: Diagnosis not present

## 2017-05-01 DIAGNOSIS — M79641 Pain in right hand: Secondary | ICD-10-CM | POA: Diagnosis not present

## 2017-05-01 DIAGNOSIS — Y33XXXA Other specified events, undetermined intent, initial encounter: Secondary | ICD-10-CM | POA: Diagnosis not present

## 2017-06-11 DIAGNOSIS — Z0000 Encounter for general adult medical examination without abnormal findings: Secondary | ICD-10-CM | POA: Diagnosis not present

## 2017-06-11 DIAGNOSIS — R69 Illness, unspecified: Secondary | ICD-10-CM | POA: Diagnosis not present

## 2017-06-11 DIAGNOSIS — G4700 Insomnia, unspecified: Secondary | ICD-10-CM | POA: Diagnosis not present

## 2017-06-11 DIAGNOSIS — M130 Polyarthritis, unspecified: Secondary | ICD-10-CM | POA: Diagnosis not present

## 2017-06-11 DIAGNOSIS — G43009 Migraine without aura, not intractable, without status migrainosus: Secondary | ICD-10-CM | POA: Diagnosis not present

## 2017-06-11 DIAGNOSIS — G5603 Carpal tunnel syndrome, bilateral upper limbs: Secondary | ICD-10-CM | POA: Diagnosis not present

## 2017-06-11 DIAGNOSIS — Z6835 Body mass index (BMI) 35.0-35.9, adult: Secondary | ICD-10-CM | POA: Diagnosis not present

## 2017-06-11 DIAGNOSIS — M722 Plantar fascial fibromatosis: Secondary | ICD-10-CM | POA: Diagnosis not present

## 2017-06-11 DIAGNOSIS — E669 Obesity, unspecified: Secondary | ICD-10-CM | POA: Diagnosis not present

## 2017-06-11 DIAGNOSIS — J301 Allergic rhinitis due to pollen: Secondary | ICD-10-CM | POA: Diagnosis not present

## 2017-06-13 ENCOUNTER — Telehealth: Payer: Self-pay

## 2017-06-13 NOTE — Telephone Encounter (Signed)
Patient is on the list for [employer] and may be a good candidate for an AWV. Please let me know if/when appt is scheduled.   

## 2017-06-16 NOTE — Telephone Encounter (Signed)
Pt had AWV with [place] [facility] [employer] nurse at her home.

## 2017-06-25 DIAGNOSIS — B029 Zoster without complications: Secondary | ICD-10-CM | POA: Diagnosis not present

## 2017-06-25 DIAGNOSIS — S30861A Insect bite (nonvenomous) of abdominal wall, initial encounter: Secondary | ICD-10-CM | POA: Diagnosis not present

## 2017-06-25 DIAGNOSIS — W57XXXA Bitten or stung by nonvenomous insect and other nonvenomous arthropods, initial encounter: Secondary | ICD-10-CM | POA: Diagnosis not present

## 2017-09-02 DIAGNOSIS — M9903 Segmental and somatic dysfunction of lumbar region: Secondary | ICD-10-CM | POA: Diagnosis not present

## 2017-09-02 DIAGNOSIS — M5136 Other intervertebral disc degeneration, lumbar region: Secondary | ICD-10-CM | POA: Diagnosis not present

## 2017-09-02 DIAGNOSIS — M546 Pain in thoracic spine: Secondary | ICD-10-CM | POA: Diagnosis not present

## 2017-09-02 DIAGNOSIS — M9902 Segmental and somatic dysfunction of thoracic region: Secondary | ICD-10-CM | POA: Diagnosis not present

## 2017-09-02 DIAGNOSIS — M531 Cervicobrachial syndrome: Secondary | ICD-10-CM | POA: Diagnosis not present

## 2017-09-02 DIAGNOSIS — M9901 Segmental and somatic dysfunction of cervical region: Secondary | ICD-10-CM | POA: Diagnosis not present

## 2017-09-05 ENCOUNTER — Observation Stay: Payer: Medicare HMO | Admitting: Anesthesiology

## 2017-09-05 ENCOUNTER — Emergency Department: Payer: Medicare HMO

## 2017-09-05 ENCOUNTER — Encounter: Payer: Self-pay | Admitting: Emergency Medicine

## 2017-09-05 ENCOUNTER — Encounter
Admission: EM | Disposition: A | Discharge status: Home / Self Care | Payer: Self-pay | Source: Home / Self Care | Attending: Emergency Medicine

## 2017-09-05 ENCOUNTER — Observation Stay
Admission: EM | Admit: 2017-09-05 | Discharge: 2017-09-06 | Disposition: A | Discharge status: Home / Self Care | Payer: Medicare HMO | Attending: Surgery | Admitting: Surgery

## 2017-09-05 DIAGNOSIS — R109 Unspecified abdominal pain: Secondary | ICD-10-CM | POA: Diagnosis not present

## 2017-09-05 DIAGNOSIS — R1110 Vomiting, unspecified: Secondary | ICD-10-CM | POA: Diagnosis not present

## 2017-09-05 DIAGNOSIS — K56609 Unspecified intestinal obstruction, unspecified as to partial versus complete obstruction: Secondary | ICD-10-CM | POA: Diagnosis not present

## 2017-09-05 DIAGNOSIS — K436 Other and unspecified ventral hernia with obstruction, without gangrene: Secondary | ICD-10-CM | POA: Diagnosis not present

## 2017-09-05 DIAGNOSIS — Z96653 Presence of artificial knee joint, bilateral: Secondary | ICD-10-CM | POA: Insufficient documentation

## 2017-09-05 DIAGNOSIS — I700 Atherosclerosis of aorta: Secondary | ICD-10-CM | POA: Insufficient documentation

## 2017-09-05 DIAGNOSIS — R112 Nausea with vomiting, unspecified: Secondary | ICD-10-CM | POA: Diagnosis not present

## 2017-09-05 DIAGNOSIS — R1084 Generalized abdominal pain: Secondary | ICD-10-CM

## 2017-09-05 DIAGNOSIS — K430 Incisional hernia with obstruction, without gangrene: Secondary | ICD-10-CM | POA: Diagnosis not present

## 2017-09-05 DIAGNOSIS — K432 Incisional hernia without obstruction or gangrene: Secondary | ICD-10-CM | POA: Diagnosis not present

## 2017-09-05 DIAGNOSIS — R110 Nausea: Secondary | ICD-10-CM | POA: Diagnosis not present

## 2017-09-05 DIAGNOSIS — K460 Unspecified abdominal hernia with obstruction, without gangrene: Secondary | ICD-10-CM

## 2017-09-05 HISTORY — PX: INCISIONAL HERNIA REPAIR: SHX193

## 2017-09-05 LAB — CBC
HEMATOCRIT: 43.5 % (ref 35.0–47.0)
Hemoglobin: 15 g/dL (ref 12.0–16.0)
MCH: 30.8 pg (ref 26.0–34.0)
MCHC: 34.4 g/dL (ref 32.0–36.0)
MCV: 89.4 fL (ref 80.0–100.0)
PLATELETS: 215 10*3/uL (ref 150–440)
RBC: 4.86 MIL/uL (ref 3.80–5.20)
RDW: 13.7 % (ref 11.5–14.5)
WBC: 8 10*3/uL (ref 3.6–11.0)

## 2017-09-05 LAB — COMPREHENSIVE METABOLIC PANEL
ALBUMIN: 4.4 g/dL (ref 3.5–5.0)
ALT: 24 U/L (ref 14–54)
ANION GAP: 10 (ref 5–15)
AST: 33 U/L (ref 15–41)
Alkaline Phosphatase: 65 U/L (ref 38–126)
BILIRUBIN TOTAL: 0.6 mg/dL (ref 0.3–1.2)
BUN: 15 mg/dL (ref 6–20)
CHLORIDE: 106 mmol/L (ref 101–111)
CO2: 27 mmol/L (ref 22–32)
Calcium: 10.5 mg/dL — ABNORMAL HIGH (ref 8.9–10.3)
Creatinine, Ser: 0.66 mg/dL (ref 0.44–1.00)
GFR calc Af Amer: >60 mL/min (ref >60)
GFR calc non Af Amer: >60 mL/min (ref >60)
GLUCOSE: 118 mg/dL — AB (ref 65–99)
POTASSIUM: 3.9 mmol/L (ref 3.5–5.1)
Sodium: 143 mmol/L (ref 135–145)
TOTAL PROTEIN: 7.3 g/dL (ref 6.5–8.1)

## 2017-09-05 LAB — URINALYSIS, COMPLETE (UACMP) WITH MICROSCOPIC
BACTERIA UA: NONE SEEN
Bilirubin Urine: NEGATIVE
Glucose, UA: NEGATIVE mg/dL
HGB URINE DIPSTICK: NEGATIVE
KETONES UR: NEGATIVE mg/dL
NITRITE: NEGATIVE
PH: 6 (ref 5.0–8.0)
PROTEIN: NEGATIVE mg/dL
SPECIFIC GRAVITY, URINE: 1.016 (ref 1.005–1.030)

## 2017-09-05 LAB — SURGICAL PCR SCREEN
MRSA, PCR: NEGATIVE
Staphylococcus aureus: POSITIVE — AB

## 2017-09-05 LAB — LIPASE, BLOOD: Lipase: 31 U/L (ref 11–51)

## 2017-09-05 LAB — LACTIC ACID, PLASMA
LACTIC ACID, VENOUS: 1.3 mmol/L (ref 0.5–1.9)
Lactic Acid, Venous: 1.5 mmol/L (ref 0.5–1.9)

## 2017-09-05 LAB — TROPONIN I

## 2017-09-05 SURGERY — REPAIR, HERNIA, INCISIONAL
Anesthesia: General | Wound class: Clean

## 2017-09-05 MED ORDER — ONDANSETRON HCL 4 MG/2ML IJ SOLN
INTRAMUSCULAR | Status: AC
  Filled 2017-09-05: qty 2

## 2017-09-05 MED ORDER — ACETAMINOPHEN 10 MG/ML IV SOLN
INTRAVENOUS | Status: DC | PRN
  Administered 2017-09-05: 1000 mg via INTRAVENOUS

## 2017-09-05 MED ORDER — ACETAMINOPHEN 10 MG/ML IV SOLN
INTRAVENOUS | Status: AC
  Filled 2017-09-05: qty 100

## 2017-09-05 MED ORDER — BUPIVACAINE HCL (PF) 0.5 % IJ SOLN
INTRAMUSCULAR | Status: AC
  Filled 2017-09-05: qty 30

## 2017-09-05 MED ORDER — FENTANYL CITRATE (PF) 100 MCG/2ML IJ SOLN: 25.0000 ug | INTRAMUSCULAR | Status: DC | PRN

## 2017-09-05 MED ORDER — LIDOCAINE HCL (PF) 2 % IJ SOLN
INTRAMUSCULAR | Status: AC
  Filled 2017-09-05: qty 6

## 2017-09-05 MED ORDER — MIDAZOLAM HCL 2 MG/2ML IJ SOLN
INTRAMUSCULAR | Status: AC
  Filled 2017-09-05: qty 2

## 2017-09-05 MED ORDER — MORPHINE SULFATE (PF) 2 MG/ML IV SOLN: 2.0000 mg | INTRAVENOUS | Status: DC | PRN

## 2017-09-05 MED ORDER — PHENYLEPHRINE HCL 10 MG/ML IJ SOLN
INTRAMUSCULAR | Status: AC
  Filled 2017-09-05: qty 1

## 2017-09-05 MED ORDER — HYDROMORPHONE HCL 1 MG/ML IJ SOLN: 0.5000 mg | INTRAMUSCULAR | Status: DC | PRN

## 2017-09-05 MED ORDER — FENTANYL CITRATE (PF) 100 MCG/2ML IJ SOLN
INTRAMUSCULAR | Status: DC | PRN
  Administered 2017-09-05 (×2): 25 ug via INTRAVENOUS
  Administered 2017-09-05: 50 ug via INTRAVENOUS
  Administered 2017-09-05: 25 ug via INTRAVENOUS

## 2017-09-05 MED ORDER — PROPOFOL 10 MG/ML IV BOLUS
INTRAVENOUS | Status: DC | PRN
  Administered 2017-09-05: 140 mg via INTRAVENOUS
  Administered 2017-09-05: 20 mg via INTRAVENOUS

## 2017-09-05 MED ORDER — KETOROLAC TROMETHAMINE 30 MG/ML IJ SOLN
INTRAMUSCULAR | Status: AC
  Filled 2017-09-05: qty 1

## 2017-09-05 MED ORDER — KCL IN DEXTROSE-NACL 20-5-0.45 MEQ/L-%-% IV SOLN
INTRAVENOUS | Status: DC
  Administered 2017-09-05 – 2017-09-06 (×2): via INTRAVENOUS
  Filled 2017-09-05 (×4): qty 1000

## 2017-09-05 MED ORDER — FENTANYL CITRATE (PF) 100 MCG/2ML IJ SOLN
INTRAMUSCULAR | Status: AC
  Filled 2017-09-05: qty 2

## 2017-09-05 MED ORDER — CEFAZOLIN SODIUM-DEXTROSE 2-4 GM/100ML-% IV SOLN
2.0000 g | Freq: Three times a day (TID) | INTRAVENOUS | Status: AC
  Administered 2017-09-05: 2 g via INTRAVENOUS
  Filled 2017-09-05: qty 100

## 2017-09-05 MED ORDER — OXYCODONE-ACETAMINOPHEN 5-325 MG PO TABS
1.0000 | ORAL_TABLET | ORAL | Status: DC | PRN
  Administered 2017-09-05: 2 via ORAL
  Administered 2017-09-05: 1 via ORAL
  Filled 2017-09-05: qty 2
  Filled 2017-09-05: qty 1

## 2017-09-05 MED ORDER — EPHEDRINE SULFATE 50 MG/ML IJ SOLN
INTRAMUSCULAR | Status: AC
  Filled 2017-09-05: qty 1

## 2017-09-05 MED ORDER — BUPIVACAINE-EPINEPHRINE (PF) 0.25% -1:200000 IJ SOLN
INTRAMUSCULAR | Status: AC
  Filled 2017-09-05: qty 30

## 2017-09-05 MED ORDER — FENTANYL CITRATE (PF) 100 MCG/2ML IJ SOLN
50.0000 ug | Freq: Once | INTRAMUSCULAR | Status: AC
  Administered 2017-09-05: 50 ug via INTRAVENOUS
  Filled 2017-09-05: qty 2

## 2017-09-05 MED ORDER — ROCURONIUM BROMIDE 50 MG/5ML IV SOLN
INTRAVENOUS | Status: AC
  Filled 2017-09-05: qty 1

## 2017-09-05 MED ORDER — ONDANSETRON HCL 4 MG/2ML IJ SOLN
4.0000 mg | Freq: Once | INTRAMUSCULAR | Status: AC
  Administered 2017-09-05: 4 mg via INTRAVENOUS

## 2017-09-05 MED ORDER — DEXAMETHASONE SODIUM PHOSPHATE 10 MG/ML IJ SOLN
INTRAMUSCULAR | Status: DC | PRN
  Administered 2017-09-05: 10 mg via INTRAVENOUS

## 2017-09-05 MED ORDER — IOPAMIDOL (ISOVUE-300) INJECTION 61%
15.0000 mL | INTRAVENOUS | Status: AC
  Administered 2017-09-05 (×2): 15 mL via ORAL

## 2017-09-05 MED ORDER — ENOXAPARIN SODIUM 40 MG/0.4ML ~~LOC~~ SOLN
40.0000 mg | SUBCUTANEOUS | Status: DC
  Filled 2017-09-05: qty 0.4

## 2017-09-05 MED ORDER — ONDANSETRON HCL 4 MG/2ML IJ SOLN: 4.0000 mg | Freq: Once | INTRAMUSCULAR | Status: DC

## 2017-09-05 MED ORDER — ONDANSETRON HCL 4 MG/2ML IJ SOLN: 4.0000 mg | Freq: Once | INTRAMUSCULAR | Status: DC | PRN

## 2017-09-05 MED ORDER — ACETAMINOPHEN 325 MG PO TABS
650.0000 mg | ORAL_TABLET | Freq: Four times a day (QID) | ORAL | Status: DC | PRN
Start: 2017-09-05 — End: 2017-09-06
  Filled 2017-09-05: qty 2

## 2017-09-05 MED ORDER — PROPOFOL 10 MG/ML IV BOLUS
INTRAVENOUS | Status: AC
  Filled 2017-09-05: qty 40

## 2017-09-05 MED ORDER — KETOROLAC TROMETHAMINE 30 MG/ML IJ SOLN
30.0000 mg | Freq: Four times a day (QID) | INTRAMUSCULAR | Status: DC
  Administered 2017-09-05 – 2017-09-06 (×4): 30 mg via INTRAVENOUS
  Filled 2017-09-05 (×4): qty 1

## 2017-09-05 MED ORDER — SUCCINYLCHOLINE CHLORIDE 20 MG/ML IJ SOLN
INTRAMUSCULAR | Status: DC | PRN
  Administered 2017-09-05: 100 mg via INTRAVENOUS

## 2017-09-05 MED ORDER — ENOXAPARIN SODIUM 40 MG/0.4ML ~~LOC~~ SOLN: 40.0000 mg | SUBCUTANEOUS | Status: DC

## 2017-09-05 MED ORDER — LIDOCAINE HCL (CARDIAC) 20 MG/ML IV SOLN
INTRAVENOUS | Status: DC | PRN
  Administered 2017-09-05: 100 mg via INTRAVENOUS

## 2017-09-05 MED ORDER — BUPIVACAINE-EPINEPHRINE (PF) 0.25% -1:200000 IJ SOLN
INTRAMUSCULAR | Status: DC | PRN
  Administered 2017-09-05: 9 mL via PERINEURAL

## 2017-09-05 MED ORDER — EPHEDRINE SULFATE 50 MG/ML IJ SOLN
INTRAMUSCULAR | Status: DC | PRN
  Administered 2017-09-05 (×2): 10 mg via INTRAVENOUS

## 2017-09-05 MED ORDER — FAMOTIDINE IN NACL 20-0.9 MG/50ML-% IV SOLN
20.0000 mg | Freq: Once | INTRAVENOUS | Status: AC
  Administered 2017-09-05: 20 mg via INTRAVENOUS
  Filled 2017-09-05: qty 50

## 2017-09-05 MED ORDER — LIDOCAINE HCL (PF) 1 % IJ SOLN
INTRAMUSCULAR | Status: AC
  Filled 2017-09-05: qty 30

## 2017-09-05 MED ORDER — DEXAMETHASONE SODIUM PHOSPHATE 10 MG/ML IJ SOLN
INTRAMUSCULAR | Status: AC
  Filled 2017-09-05: qty 1

## 2017-09-05 MED ORDER — MIDAZOLAM HCL 2 MG/2ML IJ SOLN
INTRAMUSCULAR | Status: DC | PRN
  Administered 2017-09-05 (×2): 2 mg via INTRAVENOUS

## 2017-09-05 MED ORDER — ONDANSETRON HCL 4 MG/2ML IJ SOLN
INTRAMUSCULAR | Status: DC | PRN
Start: 2017-09-05 — End: 2017-09-05
  Administered 2017-09-05: 4 mg via INTRAVENOUS

## 2017-09-05 MED ORDER — ACETAMINOPHEN 650 MG RE SUPP
650.0000 mg | Freq: Four times a day (QID) | RECTAL | Status: DC | PRN
  Filled 2017-09-05: qty 1

## 2017-09-05 MED ORDER — IOPAMIDOL (ISOVUE-300) INJECTION 61%
100.0000 mL | Freq: Once | INTRAVENOUS | Status: AC | PRN
  Administered 2017-09-05: 100 mL via INTRAVENOUS

## 2017-09-05 MED ORDER — SODIUM CHLORIDE 0.9 % IV BOLUS (SEPSIS)
1000.0000 mL | Freq: Once | INTRAVENOUS | Status: AC
  Administered 2017-09-05: 1000 mL via INTRAVENOUS

## 2017-09-05 MED ORDER — ONDANSETRON HCL 4 MG/2ML IJ SOLN
4.0000 mg | Freq: Four times a day (QID) | INTRAMUSCULAR | Status: DC | PRN
Start: 2017-09-05 — End: 2017-09-06

## 2017-09-05 MED ORDER — ONDANSETRON HCL 4 MG/2ML IJ SOLN
4.0000 mg | Freq: Once | INTRAMUSCULAR | Status: AC
  Administered 2017-09-05: 4 mg via INTRAVENOUS
  Filled 2017-09-05: qty 2

## 2017-09-05 MED ORDER — LIDOCAINE HCL (PF) 2 % IJ SOLN
INTRAMUSCULAR | Status: AC
  Filled 2017-09-05: qty 2

## 2017-09-05 MED ORDER — CEFAZOLIN SODIUM-DEXTROSE 2-4 GM/100ML-% IV SOLN
2.0000 g | INTRAVENOUS | Status: AC
  Administered 2017-09-05: 2 g via INTRAVENOUS
  Filled 2017-09-05: qty 100

## 2017-09-05 MED ORDER — LACTATED RINGERS IV SOLN
125.0000 mL/h | INTRAVENOUS | Status: DC
  Administered 2017-09-05: 125 mL/h via INTRAVENOUS

## 2017-09-05 MED ORDER — GLYCOPYRROLATE 0.2 MG/ML IJ SOLN
INTRAMUSCULAR | Status: AC
  Filled 2017-09-05: qty 1

## 2017-09-05 MED ORDER — PHENYLEPHRINE HCL 10 MG/ML IJ SOLN
INTRAMUSCULAR | Status: DC | PRN
  Administered 2017-09-05 (×2): 100 ug via INTRAVENOUS

## 2017-09-05 MED ORDER — PANTOPRAZOLE SODIUM 40 MG IV SOLR
40.0000 mg | Freq: Every day | INTRAVENOUS | Status: DC
  Administered 2017-09-05: 40 mg via INTRAVENOUS
  Filled 2017-09-05: qty 40

## 2017-09-05 MED ORDER — SUCCINYLCHOLINE CHLORIDE 20 MG/ML IJ SOLN
INTRAMUSCULAR | Status: AC
  Filled 2017-09-05: qty 1

## 2017-09-05 MED ORDER — ONDANSETRON 4 MG PO TBDP: 4.0000 mg | ORAL_TABLET | Freq: Four times a day (QID) | ORAL | Status: DC | PRN

## 2017-09-05 MED ORDER — LORAZEPAM 2 MG/ML IJ SOLN
0.5000 mg | Freq: Once | INTRAMUSCULAR | Status: AC
  Administered 2017-09-05: 0.5 mg via INTRAVENOUS
  Filled 2017-09-05: qty 1

## 2017-09-05 MED ORDER — ONDANSETRON HCL 4 MG/2ML IJ SOLN
INTRAMUSCULAR | Status: AC
  Administered 2017-09-05: 4 mg via INTRAVENOUS
  Filled 2017-09-05: qty 2

## 2017-09-05 MED ORDER — PROPOFOL 10 MG/ML IV BOLUS
INTRAVENOUS | Status: AC
  Filled 2017-09-05: qty 20

## 2017-09-05 SURGICAL SUPPLY — 34 items
ADHESIVE MASTISOL STRL (MISCELLANEOUS) ×3 IMPLANT
CANISTER SUCT 1200ML W/VALVE (MISCELLANEOUS) ×3 IMPLANT
CHLORAPREP W/TINT 26ML (MISCELLANEOUS) ×3 IMPLANT
CLOSURE WOUND 1/2 X4 (GAUZE/BANDAGES/DRESSINGS) ×1
DERMABOND ADVANCED (GAUZE/BANDAGES/DRESSINGS) ×2
DERMABOND ADVANCED .7 DNX12 (GAUZE/BANDAGES/DRESSINGS) ×1 IMPLANT
DRAPE LAPAROTOMY 100X77 ABD (DRAPES) ×3 IMPLANT
ELECT REM PT RETURN 9FT ADLT (ELECTROSURGICAL) ×3
ELECTRODE REM PT RTRN 9FT ADLT (ELECTROSURGICAL) ×1 IMPLANT
ETHIBOND 2 0 GREEN CT 2 30IN (SUTURE) ×6 IMPLANT
GAUZE SPONGE 4X4 12PLY STRL (GAUZE/BANDAGES/DRESSINGS) ×3 IMPLANT
GLOVE BIO SURGEON STRL SZ8 (GLOVE) ×18 IMPLANT
GOWN STRL REUS W/ TWL LRG LVL3 (GOWN DISPOSABLE) ×3 IMPLANT
GOWN STRL REUS W/TWL LRG LVL3 (GOWN DISPOSABLE) ×6
KIT RM TURNOVER STRD PROC AR (KITS) ×3 IMPLANT
LABEL OR SOLS (LABEL) ×3 IMPLANT
MESH VENTRALEX ST 2.5 CRC MED (Mesh General) ×3 IMPLANT
NDL SAFETY 22GX1.5 (NEEDLE) ×3 IMPLANT
NS IRRIG 500ML POUR BTL (IV SOLUTION) ×3 IMPLANT
PACK BASIN MINOR ARMC (MISCELLANEOUS) ×3 IMPLANT
SPONGE LAP 18X18 5 PK (GAUZE/BANDAGES/DRESSINGS) ×3 IMPLANT
STAPLER SKIN PROX 35W (STAPLE) IMPLANT
STRIP CLOSURE SKIN 1/2X4 (GAUZE/BANDAGES/DRESSINGS) ×2 IMPLANT
SUT ETHIBOND 2-0 (SUTURE) ×6 IMPLANT
SUT MNCRL 4-0 (SUTURE) ×2
SUT MNCRL 4-0 27XMFL (SUTURE) ×1
SUT PROLENE 0 CT 1 30 (SUTURE) ×9 IMPLANT
SUT VIC AB 0 CT2 27 (SUTURE) ×6 IMPLANT
SUT VIC AB 2-0 CT1 27 (SUTURE) ×2
SUT VIC AB 2-0 CT1 TAPERPNT 27 (SUTURE) ×1 IMPLANT
SUT VIC AB 3-0 SH 27 (SUTURE) ×4
SUT VIC AB 3-0 SH 27X BRD (SUTURE) ×2 IMPLANT
SUTURE MNCRL 4-0 27XMF (SUTURE) ×1 IMPLANT
SYRINGE 10CC LL (SYRINGE) ×3 IMPLANT

## 2017-09-05 NOTE — ED Notes (Signed)
Called surgeon, verified he wanted toradol given now and lactated ringers given now.  MD reported lovenox could be held and given later.

## 2017-09-05 NOTE — Op Note (Signed)
SURGICAL OPERATIVE REPORT  DATE OF PROCEDURE: 09/05/2017  ATTENDING Surgeon(s): Vickie Epley, MD  ANESTHESIA: general   PRE-OPERATIVE DIAGNOSIS: Incisional hernia with obstruction without gangrene (icd-10's: K43.0)  POST-OPERATIVE DIAGNOSIS: Incisional hernia with obstruction without gangrene (icd-10's: K43.0)  PROCEDURE(S):  1.) Open repair of incarcerated 2 cm suprapubic port site incisional hernia with 6.4 cm Bard Ventralex ST mesh (cpt's: 58527 + [id number])  INTRAOPERATIVE FINDINGS: 2 cm suprapubic port site incisional hernia  INTRAVENOUS FLUIDS: 1000 mL crystalloid   ESTIMATED BLOOD LOSS: Minimal (<20 mL)  URINE OUTPUT: No Foley  SPECIMENS: No Specimen  IMPLANTS: 6.4 cm Bard Ventralex ST mesh  DRAINS: none  COMPLICATIONS: None apparent  CONDITION AT END OF PROCEDURE: Hemodynamically stable and extubated  DISPOSITION OF PATIENT: PACU  INDICATIONS FOR PROCEDURE:  66 year old Female presented to [facility] ED for worsening abdominal pain, initially reported for duration of 1 day, though patient says that in retrospect she began experiencing abdominal discomfort, bloating, and pain x 3 days prior to her ED presentation as well as multiple prior similar though less painful episodes 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 She also vomited once while in the ED. CT abdomen and pelvis revealed small bowel obstruction secondary to incarcerated suprapubic incisional port site hernia s/p laparoscopic appendectomy (10/2016). Surgery was consulted, and hernia was believed to have been reduced following administration of pain medication and application of direct pressure, though this was made more difficult due to patient's body habitus. Due in particular to concern for incomplete reduction and risk of recurrence, operative repair of suprapubic port site incisional hernia was offered. All risks, benefits, and alternatives to above procedure  were discussed with the patient, all of patient's questions were answered to her expressed satisfaction, and informed consent was accordingly obtained and documented.  DETAILS OF PROCEDURE: Patient was brought to the operating suite and appropriately identified. General anesthesia was administered along with appropriate pre-operative antibiotics, and endotracheal intubation was performed by anesthetist. In supine position, operative site was prepped and draped in the usual sterile fashion, and following a brief time out, the well-healed original incisional scar was re-incised and extended deep through rather extensive subcutaneous tissues until small bowel contained within hernia sac was encountered, bluntly dissected free from surrounding tissues, and reduced. Fascia was then carefully palpated, and no additional defects were identified.  Adhesions to the underside of the abdominal wall fascia were carefully bluntly disrupted and freed from the abdominal wall fascia. Upon confirming an appropriate diameter of fascia was free of underlying adhesions to accommodate placement of the anticipated mesh without intervening intestine, omentum/fat, or hernia sac, a 6.4 cm Bard Ventralex ST mesh was selected, inserted, confirmed to lie flat against the deep surface of clearly delineated fascia, and secured with tension against the abdominal wall fascia by the attached mesh tails. First the tails of the mesh and then the remaining two quadrants of the superficial mesh surface were secured to the overlying fascia using interrupted 2-0 Ethibond sutures, and overlying tissues were reapproximated over the secured mesh. Dermis was then reapproximated using buried interrupted 3-0 Vicryl suture, additional local anesthetic was injected, and epidermis was reapproximated using running subcuticular 4-0 Monocryl suture. The skin was then cleaned and dried, and sterile Dermabond skin glue was applied.  Patient was then safely able  to be extubated, awakened, and transferred to PACU for post-operative monitoring and care.  I was present for all aspects of the above procedure, and there were  no complications apparent.

## 2017-09-05 NOTE — ED Notes (Signed)
Pt aware of need for urine specimen. Unable to void at this time. Will push call button and let us know when she can.

## 2017-09-05 NOTE — Anesthesia Post-op Follow-up Note (Deleted)
Anesthesia QCDR form completed.        

## 2017-09-05 NOTE — H&P (Addendum)
Date of Admission:  09/05/2017  Reason for Admission:  Incisional hernia causing small bowel obstruction  History of Present Illness: Danielle Nash is a 66 y.o. female who presents with a one day history of diffuse abdominal pain.  The pain started at 8 pm on 9/20, suddenly, and in diffuse location, from low abdomen radiating towards the epigastric region.  This was associated with nausea and the patient had emesis in the ED after drinking contrast for the CT scan.  Denies any symptoms prior to this episode.  She had a laparoscopic appendectomy in 10/2016 with Dr. Pat Patrick.  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  Nothing at home was making the pain better.  In the ED, her workup included labs which were overall normal with a WBC of 8, lactic acid of 1.3.  CT scan showed a low abdominal hernia at the site of prior laparoscopic port, with small bowel bulging through it and resulting in bowel obstruction.  There was concern for possible strangulated bowel.  Past Medical History: Past Medical History:  Diagnosis Date  . Allergy   . Allergy-induced asthma   . Frequent headaches   . Irritable bowel syndrome (IBS)   . Thyroid disease      Past Surgical History: Past Surgical History:  Procedure Laterality Date  . LAPAROSCOPIC APPENDECTOMY N/A 10/19/2016   Procedure: APPENDECTOMY LAPAROSCOPIC;  Surgeon: Dia Crawford III, MD;  Location: [facility] ORS;  Service: General;  Laterality: N/A;  . MEDIAL PARTIAL KNEE REPLACEMENT Bilateral   . THYROID SURGERY      Home Medications: Prior to Admission medications   Medication Sig Start Date End Date Taking? Authorizing Provider  butalbital-acetaminophen-caffeine (FIORICET, ESGIC) 50-325-40 MG tablet Take 2 tablets by mouth 2 (two) times daily as needed for migraine.    Yes [provider]  tiZANidine (ZANAFLEX) 2 MG tablet Take 2 mg by mouth every 6  (six) hours as needed for muscle spasms.   Yes [provider]    Allergies: Allergies  Allergen Reactions  . Erythromycin Other (See Comments)    Severe pain  . Oxycodone Itching and Other (See Comments)    depression  . Sulfa Antibiotics Other (See Comments)    Other reaction(s): Unknown    Social History:  reports that she has never smoked. She has never used smokeless tobacco. She reports that she does not drink alcohol or use drugs.   Family History: Family History  Problem Relation Age of Onset  . Arthritis Mother   . Diabetes Mother   . Heart disease Father   . Breast cancer Sister   . Diabetes Maternal Uncle     Review of Systems: Review of Systems  Constitutional: Negative for chills and fever.  HENT: Negative for hearing loss.   Eyes: Negative for blurred vision.  Respiratory: Negative for shortness of breath.   Cardiovascular: Negative for chest pain.  Gastrointestinal: Positive for abdominal pain, nausea and vomiting. Negative for constipation and diarrhea.  Genitourinary: Negative for dysuria.  Musculoskeletal: Negative for myalgias.  Skin: Negative for rash.  Neurological: Negative for dizziness.  Psychiatric/Behavioral: Negative for depression.  All other systems reviewed and are negative.   Physical Exam BP (!) 150/79   Pulse 73   Temp 98.1 F (36.7 C) (Oral)   Resp 20   Ht 5' (1.524 m)   Wt 77.1 kg (170 lb)   SpO2  99%   BMI 33.20 kg/m  CONSTITUTIONAL: No acute distress HEENT:  Normocephalic, atraumatic, extraocular motion intact. NECK: Trachea is midline, and there is no jugular venous distension.  RESPIRATORY:  Lungs are clear, and breath sounds are equal bilaterally. Normal respiratory effort without pathologic use of accessory muscles. CARDIOVASCULAR: Heart is regular without murmurs, gallops, or rubs. GI: The abdomen is soft, minimally distended, with tenderness to palpation in the low abdomen at the site of the suprapubic port.   The pain radiates superiorly.  There is a small hernia defect at this port site which was tender to palpation.  The hernia was easily reducible which then improved the patient's symptoms.  MUSCULOSKELETAL:  Normal muscle strength and tone in all four extremities.  No peripheral edema or cyanosis. SKIN: Skin turgor is normal. There are no pathologic skin lesions.  NEUROLOGIC:  Motor and sensation is grossly normal.  Cranial nerves are grossly intact. PSYCH:  Alert and oriented to person, place and time. Affect is normal.  Laboratory Analysis: Results for orders placed or performed during the hospital encounter of 09/05/17 (from the past 24 hour(s))  CBC     Status: None   Collection Time: 09/05/17 12:56 AM  Result Value Ref Range   WBC 8.0 3.6 - 11.0 K/uL   RBC 4.86 3.80 - 5.20 MIL/uL   Hemoglobin 15.0 12.0 - 16.0 g/dL   HCT 43.5 35.0 - 47.0 %   MCV 89.4 80.0 - 100.0 fL   MCH 30.8 26.0 - 34.0 pg   MCHC 34.4 32.0 - 36.0 g/dL   RDW 13.7 11.5 - 14.5 %   Platelets 215 150 - 440 K/uL  Comprehensive metabolic panel     Status: Abnormal   Collection Time: 09/05/17 12:56 AM  Result Value Ref Range   Sodium 143 135 - 145 mmol/L   Potassium 3.9 3.5 - 5.1 mmol/L   Chloride 106 101 - 111 mmol/L   CO2 27 22 - 32 mmol/L   Glucose, Bld 118 (H) 65 - 99 mg/dL   BUN 15 6 - 20 mg/dL   Creatinine, Ser 0.66 0.44 - 1.00 mg/dL   Calcium 10.5 (H) 8.9 - 10.3 mg/dL   Total Protein 7.3 6.5 - 8.1 g/dL   Albumin 4.4 3.5 - 5.0 g/dL   AST 33 15 - 41 U/L   ALT 24 14 - 54 U/L   Alkaline Phosphatase 65 38 - 126 U/L   Total Bilirubin 0.6 0.3 - 1.2 mg/dL   GFR calc non Af Amer >60 >60 mL/min   GFR calc Af Amer >60 >60 mL/min   Anion gap 10 5 - 15  Troponin I     Status: None   Collection Time: 09/05/17 12:56 AM  Result Value Ref Range   Troponin I <0.03 <0.03 ng/mL  Lipase, blood     Status: None   Collection Time: 09/05/17 12:56 AM  Result Value Ref Range   Lipase 31 11 - 51 U/L  Urinalysis, Complete w  Microscopic     Status: Abnormal   Collection Time: 09/05/17  2:53 AM  Result Value Ref Range   Color, Urine YELLOW (A) YELLOW   APPearance CLEAR (A) CLEAR   Specific Gravity, Urine 1.016 1.005 - 1.030   pH 6.0 5.0 - 8.0   Glucose, UA NEGATIVE NEGATIVE mg/dL   Hgb urine dipstick NEGATIVE NEGATIVE   Bilirubin Urine NEGATIVE NEGATIVE   Ketones, ur NEGATIVE NEGATIVE mg/dL   Protein, ur NEGATIVE NEGATIVE mg/dL   Nitrite  NEGATIVE NEGATIVE   Leukocytes, UA MODERATE (A) NEGATIVE   RBC / HPF 0-5 0 - 5 RBC/hpf   WBC, UA 0-5 0 - 5 WBC/hpf   Bacteria, UA NONE SEEN NONE SEEN   Squamous Epithelial / LPF 0-5 (A) NONE SEEN   Mucus PRESENT   Lactic acid, plasma     Status: None   Collection Time: 09/05/17  4:45 AM  Result Value Ref Range   Lactic Acid, Venous 1.3 0.5 - 1.9 mmol/L    Imaging: Ct Abdomen Pelvis W Contrast  Result Date: 09/05/2017 CLINICAL DATA:  Nausea and vomiting since 8 p.m. Generalized abdominal pain. History of irritable bowel syndrome and appendectomy. EXAM: CT ABDOMEN AND PELVIS WITH CONTRAST TECHNIQUE: Multidetector CT imaging of the abdomen and pelvis was performed using the standard protocol following bolus administration of intravenous contrast. CONTRAST:  185mL ISOVUE-300 IOPAMIDOL (ISOVUE-300) INJECTION 61% COMPARISON:  10/18/2016 FINDINGS: Lower chest: Bilateral lower lung opacities likely representing atelectasis. Hepatobiliary: No focal liver abnormality is seen. No gallstones, gallbladder wall thickening, or biliary dilatation. Pancreas: Unremarkable. No pancreatic ductal dilatation or surrounding inflammatory changes. Spleen: Normal in size without focal abnormality. Adrenals/Urinary Tract: No adrenal gland nodules. Renal nephrograms are homogeneous and symmetrical. Subcentimeter cysts in both kidneys. 2 mm stone in the midportion left kidney. No ureteral or bladder stones. Bladder wall is not thickened. Stomach/Bowel: There is a midline ventral abdominal wall hernia in  the low pelvic region containing small bowel. There is evidence of small bowel obstruction caused by the hernia. Proximal small bowel are mildly dilated with small bowel feces sign and air-fluid levels. There is mild of wall thickening of the small bowel near the hernia with some infiltration in the adjacent fat suggesting fat necrosis and possible strangulation. Colon is decompressed with scattered stool. Appendix is surgically absent. Vascular/Lymphatic: Aortic atherosclerosis. No enlarged abdominal or pelvic lymph nodes. Reproductive: Uterus and bilateral adnexa are unremarkable. Other: No free air or free fluid in the abdomen. Musculoskeletal: Degenerative changes in the spine. No destructive bone lesions. IMPRESSION: 1. Midline ventral abdominal wall hernia in the low pelvis containing small bowel with proximal obstruction. There is evidence of strangulation with bowel wall thickening and infiltration in the herniated fat. 2. Nonobstructing stone in the left kidney. 3. Aortic atherosclerosis. Electronically Signed   By: Lucienne Capers M.D.   On: 09/05/2017 04:18    Assessment and Plan: This is a 66 y.o. female who presents with small bowel obstruction due to an incisional hernia.  I have independently viewed the patient's imaging study and reviewed her laboratory studies as well.  Overall, she does have an incisional hernia at the suprapubic port site from her laparoscopic appendectomy, with small bowel loop protruding and causing a bowel obstruction.  Her hernia was reduced at bedside.  All her labs are overall normal, including WBC of 8 and lactic acid of 1.3.  Discussed with the patient that her hernia was successfully reduced at bedside which helps avoid an emergency surgery to repair her hernia.  However, given that this hernia has caused a bowel obstruction and presented so suddenly, it should be repaired while she's in the hospital.  Recommended to her that we admit her to the surgical team and  go to the OR today in a semi-elective fashion.  Discussed the proposed surgery which would be an incisional hernia repair, possibly with mesh.  Discussed the risks including risk of bleeding, infection, and injury to surrounding structures and she's willing to proceed.  She will  be NPO with IV fluid hydration and appropriate pain and nausea control.  No antibiotics needed right now until surgery and will be ordered on call to the OR.  She is aware that it would be my partner, Dr. Tama High, who would be doing the surgery due to shift change this morning.  The patient understands this plan and all of her questions have been answered.   Melvyn Neth, [place]

## 2017-09-05 NOTE — ED Notes (Signed)
Patient transported to CT 

## 2017-09-05 NOTE — Care Management Obs Status (Signed)
[place] NOTIFICATION   Patient Details  Name: Danielle Nash MRN: [id number] Date of Birth: 06/15/1951   Medicare Observation Status Notification Given:  Yes    Beverly Sessions, RN 09/05/2017, 4:01 PM

## 2017-09-05 NOTE — ED Provider Notes (Signed)
[facility] Emergency Department Provider Note   ____________________________________________   First MD Initiated Contact with Patient 09/05/17 0128     (approximate)  I have reviewed the triage vital signs and the nursing notes.   HISTORY  Chief Complaint Abdominal Pain    HPI Danielle Nash is a 66 y.o. female who presents to the ED from home with a chief complaint of abdominal pain and nausea. Patient has a history of IBS, status post appendectomy who began to experience sharp, generalized abdominal pain at approximately 8 PM. Describes epigastric burning sensation and generalized nonradiating abdominal pain associated with nausea. Had a very small amount of emesis when she got to the ED. Denies recent fever, chills, chest pain, shortness of breath, dysuria. Has chronically loose stool secondary to her IBS. Denies recent travel or trauma. Denies anticoagulant use. Nothing makes her symptoms better or worse.   Past Medical History:  Diagnosis Date  . Allergy   . Allergy-induced asthma   . Frequent headaches   . Irritable bowel syndrome (IBS)   . Thyroid disease     Patient Active Problem List   Diagnosis Date Noted  . Incisional hernia of anterior abdominal wall with obstruction 09/05/2017  . Abdominal pain 10/18/2016  . Thyroid disease 06/12/2016  . Allergy-induced asthma 06/12/2016  . Frequent headaches 06/12/2016  . Acute appendicitis 03/11/2016  . Insomnia, persistent 10/02/2015  . Degeneration of intervertebral disc of lumbar region 05/19/2015    Past Surgical History:  Procedure Laterality Date  . LAPAROSCOPIC APPENDECTOMY N/A 10/19/2016   Procedure: APPENDECTOMY LAPAROSCOPIC;  Surgeon: Dia Crawford III, MD;  Location: [facility] ORS;  Service: General;  Laterality: N/A;  . MEDIAL PARTIAL KNEE REPLACEMENT Bilateral   . THYROID SURGERY      Prior to Admission medications   Medication Sig Start Date End Date Taking? Authorizing Provider    butalbital-acetaminophen-caffeine (FIORICET, ESGIC) 50-325-40 MG tablet Take 2 tablets by mouth 2 (two) times daily as needed for migraine.    Yes [provider]  tiZANidine (ZANAFLEX) 2 MG tablet Take 2 mg by mouth every 6 (six) hours as needed for muscle spasms.   Yes [provider]    Allergies Erythromycin; Oxycodone; and Sulfa antibiotics  Family History  Problem Relation Age of Onset  . Arthritis Mother   . Diabetes Mother   . Heart disease Father   . Breast cancer Sister   . Diabetes Maternal Uncle     Social History Social History  Substance Use Topics  . Smoking status: Never Smoker  . Smokeless tobacco: Never Used  . Alcohol use No     Comment: rare    Review of Systems  Constitutional: No fever/chills. Eyes: No visual changes. ENT: No sore throat. Cardiovascular: Denies chest pain. Respiratory: Denies shortness of breath. Gastrointestinal: positive for abdominal pain and nausea, no vomiting.  No diarrhea.  No constipation. Genitourinary: Negative for dysuria. Musculoskeletal: Negative for back pain. Skin: Negative for rash. Neurological: Negative for headaches, focal weakness or numbness.   ____________________________________________   PHYSICAL EXAM:  VITAL SIGNS: ED Triage Vitals  Enc Vitals Group     BP 09/05/17 0053 (!) 155/89     Pulse Rate 09/05/17 0053 92     Resp 09/05/17 0053 20     Temp 09/05/17 0053 98.1 F (36.7 C)     Temp Source 09/05/17 0053 Oral     SpO2 09/05/17 0053 95 %     Weight 09/05/17 0052 170 lb (77.1  kg)     Height 09/05/17 0052 5' (1.524 m)     Head Circumference --      Peak Flow --      Pain Score 09/05/17 0108 10     Pain Loc --      Pain Edu? --      Excl. in [place]? --     Constitutional: Alert and oriented. Well appearing and in mild acute distress. Eyes: Conjunctivae are normal. PERRL. EOMI. Head: Atraumatic. Nose: No congestion/rhinnorhea. Mouth/Throat: Mucous membranes are moist.   Oropharynx non-erythematous. Neck: No stridor.   Cardiovascular: Normal rate, regular rhythm. Grossly normal heart sounds.  Good peripheral circulation. Respiratory: Normal respiratory effort.  No retractions. Lungs CTAB. Gastrointestinal: Soft and mildly diffusely tender to palpation without rebound or guarding.. No distention. No abdominal bruits. No CVA tenderness. Musculoskeletal: No lower extremity tenderness nor edema.  No joint effusions. Neurologic:  Normal speech and language. No gross focal neurologic deficits are appreciated. No gait instability. Skin:  Skin is warm, dry and intact. No rash noted. Psychiatric: Mood and affect are normal. Speech and behavior are normal.  ____________________________________________   LABS (all labs ordered are listed, but only abnormal results are displayed)  Labs Reviewed  COMPREHENSIVE METABOLIC PANEL - Abnormal; Notable for the following:       Result Value   Glucose, Bld 118 (*)    Calcium 10.5 (*)    All other components within normal limits  URINALYSIS, COMPLETE (UACMP) WITH MICROSCOPIC - Abnormal; Notable for the following:    Color, Urine YELLOW (*)    APPearance CLEAR (*)    Leukocytes, UA MODERATE (*)    Squamous Epithelial / LPF 0-5 (*)    All other components within normal limits  CBC  TROPONIN I  LIPASE, BLOOD  LACTIC ACID, PLASMA  LACTIC ACID, PLASMA   ____________________________________________  EKG  ED ECG REPORT I, Trayton Szabo J, the attending physician, personally viewed and interpreted this ECG.   Date: 09/05/2017  EKG Time: 0102  Rate: 69  Rhythm: normal EKG, normal sinus rhythm  Axis: normal  Intervals:none  ST&T Change: nonspecific  ____________________________________________  RADIOLOGY  Ct Abdomen Pelvis W Contrast  Result Date: 09/05/2017 CLINICAL DATA:  Nausea and vomiting since 8 p.m. Generalized abdominal pain. History of irritable bowel syndrome and appendectomy. EXAM: CT ABDOMEN AND PELVIS  WITH CONTRAST TECHNIQUE: Multidetector CT imaging of the abdomen and pelvis was performed using the standard protocol following bolus administration of intravenous contrast. CONTRAST:  111mL ISOVUE-300 IOPAMIDOL (ISOVUE-300) INJECTION 61% COMPARISON:  10/18/2016 FINDINGS: Lower chest: Bilateral lower lung opacities likely representing atelectasis. Hepatobiliary: No focal liver abnormality is seen. No gallstones, gallbladder wall thickening, or biliary dilatation. Pancreas: Unremarkable. No pancreatic ductal dilatation or surrounding inflammatory changes. Spleen: Normal in size without focal abnormality. Adrenals/Urinary Tract: No adrenal gland nodules. Renal nephrograms are homogeneous and symmetrical. Subcentimeter cysts in both kidneys. 2 mm stone in the midportion left kidney. No ureteral or bladder stones. Bladder wall is not thickened. Stomach/Bowel: There is a midline ventral abdominal wall hernia in the low pelvic region containing small bowel. There is evidence of small bowel obstruction caused by the hernia. Proximal small bowel are mildly dilated with small bowel feces sign and air-fluid levels. There is mild of wall thickening of the small bowel near the hernia with some infiltration in the adjacent fat suggesting fat necrosis and possible strangulation. Colon is decompressed with scattered stool. Appendix is surgically absent. Vascular/Lymphatic: Aortic atherosclerosis. No enlarged abdominal or pelvic lymph  nodes. Reproductive: Uterus and bilateral adnexa are unremarkable. Other: No free air or free fluid in the abdomen. Musculoskeletal: Degenerative changes in the spine. No destructive bone lesions. IMPRESSION: 1. Midline ventral abdominal wall hernia in the low pelvis containing small bowel with proximal obstruction. There is evidence of strangulation with bowel wall thickening and infiltration in the herniated fat. 2. Nonobstructing stone in the left kidney. 3. Aortic atherosclerosis. Electronically  Signed   By: Lucienne Capers M.D.   On: 09/05/2017 04:18    ____________________________________________   PROCEDURES  Procedure(s) performed: None  Procedures  Critical Care performed: Yes, see critical care note(s)   CRITICAL CARE Performed by: Paulette Blanch   Total critical care time: 30 minutes  Critical care time was exclusive of separately billable procedures and treating other patients.  Critical care was necessary to treat or prevent imminent or life-threatening deterioration.  Critical care was time spent personally by me on the following activities: development of treatment plan with patient and/or surrogate as well as nursing, discussions with consultants, evaluation of patient's response to treatment, examination of patient, obtaining history from patient or surrogate, ordering and performing treatments and interventions, ordering and review of laboratory studies, ordering and review of radiographic studies, pulse oximetry and re-evaluation of patient's condition.  ____________________________________________   INITIAL IMPRESSION / ASSESSMENT AND PLAN / ED COURSE  Pertinent labs & imaging results that were available during my care of the patient were reviewed by me and considered in my medical decision making (see chart for details).  66 year old female who presents with generalized abdominal pain and nausea. History of laparoscopic appendectomy and IBS. Differential diagnosis includes exacerbation of IBS, colitis, diverticulitis, bowel obstruction. Will obtain screening lab work and urinalysis, CT abdomen/pelvis. Initiate IV fluid resuscitation, IV analgesia and antiemetic.  Clinical Course as of Sep 05 604  Fri Sep 05, 2017  0243 Laboratory results noted; unremarkable. Awaiting urinalysis. Patient drinking oral contrast.  [JS]  0314 Low-dose Ativan ordered for anxiety. Patient dry heaving; will add antiemetic.  [JS]  [id number] Updated patient of CT imaging results.  Discussed with surgeon on call Dr. Hampton Abbot who will evaluate patient in the emergency department. Lactic acid added.  [JS]    Clinical Course User Index [JS] Paulette Blanch, MD     ____________________________________________   FINAL CLINICAL IMPRESSION(S) / ED DIAGNOSES  Final diagnoses:  Generalized abdominal pain  Nausea  SBO (small bowel obstruction) ([place])  Ventral hernia with bowel obstruction  Incarcerated hernia      NEW MEDICATIONS STARTED DURING THIS VISIT:  New Prescriptions   No medications on file     Note:  This document was prepared using Dragon voice recognition software and may include unintentional dictation errors.    Paulette Blanch, MD 09/05/17 [phone]

## 2017-09-05 NOTE — ED Notes (Signed)
ED Provider at bedside. 

## 2017-09-05 NOTE — ED Notes (Signed)
Called floor to let them know patient on the way 

## 2017-09-05 NOTE — Transfer of Care (Signed)
Immediate Anesthesia Transfer of Care Note  Patient: Danielle Nash  Procedure(s) Performed: Procedure(s): HERNIA REPAIR INCISIONAL (N/A)  Patient Location: PACU  Anesthesia Type:General  Level of Consciousness: sedated  Airway & Oxygen Therapy: Patient Spontanous Breathing and Patient connected to face mask oxygen  Post-op Assessment: Report given to RN and Post -op Vital signs reviewed and stable  Post vital signs: Reviewed and stable  Last Vitals:  Vitals:   09/05/17 0901 09/05/17 0908  BP: 130/85   Pulse: 68   Resp: 18   Temp:  (!) 36.3 C  SpO2: 35%     Complications: No apparent anesthesia complications

## 2017-09-05 NOTE — Interval H&P Note (Signed)
History and Physical Interval Note:  09/05/2017 12:22 PM  Danielle Nash  has presented today for surgery, with the diagnosis of incisional hernia  The various methods of treatment have been discussed with the patient and family. After consideration of risks, benefits and other options for treatment, the patient has consented to  Procedure(s): HERNIA REPAIR INCISIONAL (N/A) as a surgical intervention .  The patient's history has been reviewed, patient examined, no change in status, stable for surgery.  I have reviewed the patient's chart and labs.  Questions were answered to the patient's satisfaction.     Vickie Epley

## 2017-09-05 NOTE — Anesthesia Post-op Follow-up Note (Signed)
Anesthesia QCDR form completed.        

## 2017-09-05 NOTE — ED Triage Notes (Addendum)
Patient ambulatory to triage with steady gait, without difficulty or distress noted; pt reports N/V since 8pm with generalized abd pain; denies hx of same; pt taken to room 5 by EDT Jann to be placed on card monitor for EKG and further eval

## 2017-09-05 NOTE — Anesthesia Postprocedure Evaluation (Signed)
Anesthesia Post Note  Patient: Danielle Nash  Procedure(s) Performed: Procedure(s) (LRB): HERNIA REPAIR INCISIONAL (N/A)  Patient location during evaluation: PACU Anesthesia Type: General Level of consciousness: awake and alert Pain management: pain level controlled Vital Signs Assessment: post-procedure vital signs reviewed and stable Respiratory status: spontaneous breathing and respiratory function stable Cardiovascular status: stable Anesthetic complications: no     Last Vitals:  Vitals:   09/05/17 1319 09/05/17 1334  BP: 116/66 123/78  Pulse: 84 81  Resp: 20 (!) 21  Temp:    SpO2: 93% 91%    Last Pain:  Vitals:   09/05/17 1334  TempSrc:   PainSc: Asleep                 Locklan Canoy K

## 2017-09-05 NOTE — Anesthesia Procedure Notes (Signed)
Procedure Name: Intubation Date/Time: 09/05/2017 10:36 AM Performed by: Doreen Salvage Pre-anesthesia Checklist: Patient identified, Emergency Drugs available, Suction available and Patient being monitored Patient Re-evaluated:Patient Re-evaluated prior to induction Oxygen Delivery Method: Circle system utilized Preoxygenation: Pre-oxygenation with 100% oxygen Induction Type: IV induction, Cricoid Pressure applied and Rapid sequence Ventilation: Mask ventilation without difficulty Laryngoscope Size: Mac and 3 Grade View: Grade II Tube type: Oral Tube size: 7.0 mm Number of attempts: 1 Airway Equipment and Method: Stylet Placement Confirmation: ETT inserted through vocal cords under direct vision,  positive ETCO2 and breath sounds checked- equal and bilateral Secured at: 21 cm Tube secured with: Tape Dental Injury: Teeth and Oropharynx as per pre-operative assessment

## 2017-09-05 NOTE — Anesthesia Preprocedure Evaluation (Signed)
Anesthesia Evaluation  Patient identified by MRN, date of birth, ID band Patient awake    Reviewed: Allergy & Precautions, NPO status , Patient's Chart, lab work & pertinent test results  History of Anesthesia Complications (+) PONV  Airway Mallampati: II       Dental   Pulmonary asthma (reactive airway dz, no inhalers) , neg COPD,           Cardiovascular (-) hypertension(-) Past MI and (-) CHF (-) dysrhythmias (-) Valvular Problems/Murmurs     Neuro/Psych neg Seizures    GI/Hepatic Neg liver ROS, neg GERD  ,Patient received Oral Contrast Agents,  Endo/Other  neg diabetes  Renal/GU negative Renal ROS     Musculoskeletal   Abdominal   Peds  Hematology   Anesthesia Other Findings   Reproductive/Obstetrics                             Anesthesia Physical Anesthesia Plan  ASA: II and emergent  Anesthesia Plan: General   Post-op Pain Management:    Induction: Rapid sequence  PONV Risk Score and Plan: 3 and Ondansetron, Dexamethasone, Midazolam and Treatment may vary due to age or medical condition  Airway Management Planned: Oral ETT  Additional Equipment:   Intra-op Plan:   Post-operative Plan:   Informed Consent: I have reviewed the patients History and Physical, chart, labs and discussed the procedure including the risks, benefits and alternatives for the proposed anesthesia with the patient or authorized representative who has indicated his/her understanding and acceptance.     Plan Discussed with:   Anesthesia Plan Comments:         Anesthesia Quick Evaluation

## 2017-09-06 LAB — BASIC METABOLIC PANEL
Anion gap: 5 (ref 5–15)
BUN: 8 mg/dL (ref 6–20)
CHLORIDE: 109 mmol/L (ref 101–111)
CO2: 25 mmol/L (ref 22–32)
CREATININE: 0.5 mg/dL (ref 0.44–1.00)
Calcium: 8.6 mg/dL — ABNORMAL LOW (ref 8.9–10.3)
GFR calc Af Amer: >60 mL/min (ref >60)
GFR calc non Af Amer: >60 mL/min (ref >60)
Glucose, Bld: 119 mg/dL — ABNORMAL HIGH (ref 65–99)
Potassium: 3.7 mmol/L (ref 3.5–5.1)
SODIUM: 139 mmol/L (ref 135–145)

## 2017-09-06 LAB — CBC
HEMATOCRIT: 33.7 % — AB (ref 35.0–47.0)
Hemoglobin: 12.1 g/dL (ref 12.0–16.0)
MCH: 32.1 pg (ref 26.0–34.0)
MCHC: 35.8 g/dL (ref 32.0–36.0)
MCV: 89.5 fL (ref 80.0–100.0)
Platelets: 171 10*3/uL (ref 150–440)
RBC: 3.77 MIL/uL — ABNORMAL LOW (ref 3.80–5.20)
RDW: 14.1 % (ref 11.5–14.5)
WBC: 9.2 10*3/uL (ref 3.6–11.0)

## 2017-09-06 LAB — MAGNESIUM: MAGNESIUM: 1.9 mg/dL (ref 1.7–2.4)

## 2017-09-06 MED ORDER — OXYCODONE-ACETAMINOPHEN 5-325 MG PO TABS: 1.0000 | ORAL_TABLET | Freq: Four times a day (QID) | ORAL | 0 refills | Status: DC | PRN

## 2017-09-06 NOTE — Progress Notes (Signed)
09/06/2017 10:39 AM  Arna Snipe to be D/C'd Home per MD order.  Discussed prescriptions and follow up appointments with the patient. Prescriptions given to patient, medication list explained in detail. Pt verbalized understanding.  Allergies as of 09/06/2017      Reactions   Erythromycin Other (See Comments)   Severe pain   Oxycodone Itching, Other (See Comments)   depression   Sulfa Antibiotics Other (See Comments)   Other reaction(s): Unknown      Medication List    TAKE these medications   butalbital-acetaminophen-caffeine 50-325-40 MG tablet Commonly known as:  FIORICET, ESGIC Take 2 tablets by mouth 2 (two) times daily as needed for migraine.   oxyCODONE-acetaminophen 5-325 MG tablet Commonly known as:  PERCOCET/ROXICET Take 1-2 tablets by mouth every 6 (six) hours as needed for severe pain.   tiZANidine 2 MG tablet Commonly known as:  ZANAFLEX Take 2 mg by mouth every 6 (six) hours as needed for muscle spasms.            Discharge Care Instructions        Start     Ordered   09/06/17 0000  oxyCODONE-acetaminophen (PERCOCET/ROXICET) 5-325 MG tablet  Every 6 hours PRN     09/06/17 1005      Vitals:   09/05/17 2118 09/06/17 0703  BP: 126/80 (!) 106/54  Pulse: 91 64  Resp: 18 18  Temp: 98.1 F (36.7 C) 98.4 F (36.9 C)  SpO2: 94% 98%    Skin clean, dry and intact without evidence of skin break down, no evidence of skin tears noted. IV catheter discontinued intact. Site without signs and symptoms of complications. Dressing and pressure applied. Pt denies pain at this time. No complaints noted.  An After Visit Summary was printed and given to the patient. Patient escorted via [place], and D/C home via private auto.  Dola Argyle

## 2017-09-06 NOTE — Discharge Instructions (Signed)
In addition to included general post-operative instructions for Repaired Hernia,  Diet: Gradually resume home heart healthy diet.   Activity: No heavy lifting >20 pounds (children, pets, laundry, garbage) or strenuous activity until follow-up (and anticipate limited activity/lifting x 4 - 6 weeks), but light activity and walking are encouraged. Do not drive or drink alcohol if taking narcotic pain medications.  Wound care: 2 days after surgery (Sunday, 9/23), may shower/get incision wet with soapy water and pat dry (do not rub incisions), but no baths or submerging incision underwater until follow-up.   Medications: Resume all home medications. For mild to moderate pain: acetaminophen (Tylenol) or ibuprofen (if no kidney disease). Combining Tylenol with alcohol can substantially increase your risk of causing liver disease. Narcotic pain medications, if prescribed, can be used for severe pain, though may cause nausea, constipation, and drowsiness. Do not combine Tylenol and Percocet within a 6 hour period as Percocet contains Tylenol. If you do not need the narcotic pain medication, you do not need to fill the prescription.  Call office [phone]) at any time if any questions, worsening pain, fevers/chills, bleeding, drainage from incision site, or other concerns.

## 2017-09-06 NOTE — Discharge Summary (Signed)
Physician Discharge Summary  Patient ID: Danielle Nash MRN: [id number] DOB/AGE: 1951/08/20 66 y.o.  Admit date: 09/05/2017 Discharge date: 09/06/2017  Admission Diagnoses:  Discharge Diagnoses:  Active Problems:   Incisional hernia of anterior abdominal wall with obstruction   Discharged Condition: good  Hospital Course: 66 year old Female presented to [facility] for abdominal discomfort, bloating, and gradual onset of lower abdominal pain, which became acutely worse over the 24 hours prior to presentation. Workup was found to be significant for CT demonstrating small bowel obstruction attributable to an incarcerated suprapubic incisional hernia, which was believed to be reduced, after which open repair of patient's suprapubic incisional hernia was performed with placement of mesh. The remainder of patient's hospital course was essentially unremarkable with advancement of patient's diet and ambulation well-tolerated. Accordingly, discharge planning was initiated, and patient was safely able to be discharged home with appropriate discharge instructions, pain control, and outpatient surgical follow-up after all of her and family's questions were answered to their expressed satisfaction.  Consults: None  Significant Diagnostic Studies: radiology: CT scan: small bowel obstruction attributed to incarcerated suprapubic incisional abdominal wall hernia  Treatments: IV hydration and surgery: reduction of suprapubic incisional abdominal wall hernia and open repair of incisional abdominal wall hernia with placement of mesh  Discharge Exam: Blood pressure (!) 106/54, pulse 64, temperature 98.4 F (36.9 C), temperature source Oral, resp. rate 18, height 5' (1.524 m), weight 170 lb (77.1 kg), SpO2 98 %. General appearance: alert, cooperative and no distress GI: abdomen soft and non-distended with minimal peri-incisional tenderness to palpation and incision well-approximated without erythema or  drainage  Disposition: 01-Home or Self Care   Allergies as of 09/06/2017      Reactions   Erythromycin Other (See Comments)   Severe pain   Oxycodone Itching, Other (See Comments)   depression   Sulfa Antibiotics Other (See Comments)   Other reaction(s): Unknown      Medication List    TAKE these medications   butalbital-acetaminophen-caffeine 50-325-40 MG tablet Commonly known as:  FIORICET, ESGIC Take 2 tablets by mouth 2 (two) times daily as needed for migraine.   oxyCODONE-acetaminophen 5-325 MG tablet Commonly known as:  PERCOCET/ROXICET Take 1-2 tablets by mouth every 6 (six) hours as needed for severe pain.   tiZANidine 2 MG tablet Commonly known as:  ZANAFLEX Take 2 mg by mouth every 6 (six) hours as needed for muscle spasms.            Discharge Care Instructions        Start     Ordered   09/06/17 0000  oxyCODONE-acetaminophen (PERCOCET/ROXICET) 5-325 MG tablet  Every 6 hours PRN     09/06/17 1005     Follow-up [facility]. Schedule an appointment as soon as possible for a visit in 2 week(s).   Specialty:  General Surgery Contact information: [address] [place]          Signed: Vickie Epley 09/06/2017, 7:45 AM

## 2017-09-08 ENCOUNTER — Telehealth: Payer: Self-pay

## 2017-09-08 ENCOUNTER — Other Ambulatory Visit: Payer: Self-pay

## 2017-09-08 ENCOUNTER — Encounter: Payer: Self-pay | Admitting: Surgery

## 2017-09-08 DIAGNOSIS — Z9889 Other specified postprocedural states: Secondary | ICD-10-CM | POA: Insufficient documentation

## 2017-09-08 MED ORDER — HYDROCODONE-ACETAMINOPHEN 5-325 MG PO TABS: 1.0000 | ORAL_TABLET | Freq: Four times a day (QID) | ORAL | 0 refills | Status: DC | PRN

## 2017-09-08 MED ORDER — HYDROCODONE-ACETAMINOPHEN 10-325 MG PO TABS: 1.0000 | ORAL_TABLET | Freq: Four times a day (QID) | ORAL | 0 refills | Status: DC | PRN

## 2017-09-08 NOTE — Telephone Encounter (Signed)
Patient has decided to pick up prescription for pain medication.  Printed and signed and placed at front desk.

## 2017-09-08 NOTE — Telephone Encounter (Signed)
Post-op call made to patient at this time. Spoke with Danielle Nash. Post-op interview questions below.  1. How are you feeling? better  2. Is your pain controlled? No   3. What are you doing for the pain? Was given Oxycodone but she is not taking this due to how it makes her feel.  4. Are you having any Nausea or Vomiting? no  5. Are you having any Fever or Chills? no  6. Are you having any Constipation or Diarrhea? No had bowel movement today.  7. Is there any Swelling or Bruising you are concerned about? no  8. Do you have any questions or concerns at this time? no   Discussion: Patient is requesting pain medication. She does not like the Oxycodone as it makes her depressed and causes itching. Will check with Doctor on call to see about something for pain and call patient back. Spoke with Dr.Piscoya. Per Dr.Piscya Vicodin 5/325 mg 1 tablet every 4-6 hours # 30 no refills. Patient requested this to be faxed, however was told we could not do this. She has follow up appointment 09/12/17.

## 2017-09-09 ENCOUNTER — Other Ambulatory Visit: Payer: Self-pay

## 2017-09-12 ENCOUNTER — Encounter: Payer: Self-pay | Admitting: Surgery

## 2017-09-12 ENCOUNTER — Ambulatory Visit (INDEPENDENT_AMBULATORY_CARE_PROVIDER_SITE_OTHER): Payer: Medicare HMO | Admitting: Surgery

## 2017-09-12 VITALS — BP 136/94 | HR 85 | Temp 98.0°F | Ht 60.0 in | Wt 175.6 lb

## 2017-09-12 DIAGNOSIS — K430 Incisional hernia with obstruction, without gangrene: Secondary | ICD-10-CM

## 2017-09-12 NOTE — Progress Notes (Signed)
Surgical Clinic Progress/Follow-up Note   HPI:  66 y.o. Female presents to clinic for post-op follow-up evaluation 1 week s/p open repair of incarcerated suprapubic port-site incisional hernia with mesh for resolving SBO. Patient reports her initial peri-incisional pain has improved significantly over the past few days and is now "just mildly sore". She otherwise reports +flatus and +BM WNL, denies N/V, fever/chills, CP, or SOB.  Review of Systems:  Constitutional: denies any other weight loss, fever, chills, or sweats  Eyes: denies any other vision changes, history of eye injury  ENT: denies sore throat, hearing problems  Respiratory: denies shortness of breath, wheezing  Cardiovascular: denies chest pain, palpitations  Gastrointestinal: abdominal pain, N/V, and bowel function as per HPI Musculoskeletal: denies any other joint pains or cramps  Skin: Denies any other rashes or skin discolorations  Neurological: denies any other headache, dizziness, weakness  Psychiatric: denies any other depression, anxiety  All other review of systems: otherwise negative   Vital Signs:  BP (!) 136/94   Pulse 85   Temp 98 F (36.7 C) (Oral)   Ht 5' (1.524 m)   Wt 175 lb 9.6 oz (79.7 kg)   BMI 34.29 kg/m    Physical Exam:  Constitutional:  -- Normal body habitus  -- Awake, alert, and oriented x3  Eyes:  -- Pupils equally round and reactive to light  -- No scleral icterus  Ear, nose, throat:  -- No jugular venous distension  -- No nasal drainage, bleeding Pulmonary:  -- No crackles -- Equal breath sounds bilaterally -- Breathing non-labored at rest Cardiovascular:  -- S1, S2 present  -- No pericardial rubs  Gastrointestinal:  -- Soft and non-distended with mild peri-incisional tenderness to palpation, no guarding/rebound -- Incision is well-approximated without surrounding erythema or incisional drainage -- No abdominal masses appreciated, pulsatile or otherwise  Musculoskeletal /  Integumentary:  -- Wounds or skin discoloration: None appreciated except post-surgical wounds as described above (GI)  -- Extremities: B/L UE and LE FROM, hands and feet warm, no edema  Neurologic:  -- Motor function: intact and symmetric  -- Sensation: intact and symmetric   Assessment:  66 y.o. yo Female with a problem list including...  Patient Active Problem List   Diagnosis Date Noted  . Status post surgery 09/08/2017  . Incisional hernia of anterior abdominal wall with obstruction 09/05/2017  . Abdominal pain 10/18/2016  . Thyroid disease 06/12/2016  . Allergy-induced asthma 06/12/2016  . Frequent headaches 06/12/2016  . Acute appendicitis 03/11/2016  . Insomnia, persistent 10/02/2015  . Chronic insomnia 10/02/2015  . Degeneration of intervertebral disc of lumbar region 05/19/2015  . Lumbar radiculitis 05/19/2015    presents to clinic for post-op follow-up evaluation, doing well 1 week s/p open repair of suprapubic port-site incisional hernia with mesh for resolving SBO.  Plan:   - pain control prn   - no heavy lifting >20 lbs x 1 more week and no heavy lifting >40 lbs x 4 additional weeks  - okay to shower, but no submerging incision under water (such as baths or swimming) x 1 more week  - return to clinic as needed, instructed to call office if any questions or concerns  All of the above recommendations were discussed with the patient, and all of patient's questions were answered to her expressed satisfaction.  -- Marilynne Drivers Rosana Hoes, MD, [place]: [facility] for exceptional care. Office: [phone]

## 2017-09-12 NOTE — Patient Instructions (Signed)
Please call our office with any questions or concerns. 

## 2017-09-16 DIAGNOSIS — G4700 Insomnia, unspecified: Secondary | ICD-10-CM | POA: Diagnosis not present

## 2017-09-16 DIAGNOSIS — R635 Abnormal weight gain: Secondary | ICD-10-CM | POA: Diagnosis not present

## 2017-09-16 DIAGNOSIS — R351 Nocturia: Secondary | ICD-10-CM | POA: Diagnosis not present

## 2017-09-16 DIAGNOSIS — R5383 Other fatigue: Secondary | ICD-10-CM | POA: Diagnosis not present

## 2017-09-16 DIAGNOSIS — R0689 Other abnormalities of breathing: Secondary | ICD-10-CM | POA: Diagnosis not present

## 2017-09-16 DIAGNOSIS — Z8709 Personal history of other diseases of the respiratory system: Secondary | ICD-10-CM | POA: Diagnosis not present

## 2017-09-16 DIAGNOSIS — Z7689 Persons encountering health services in other specified circumstances: Secondary | ICD-10-CM | POA: Diagnosis not present

## 2017-09-16 DIAGNOSIS — Z9889 Other specified postprocedural states: Secondary | ICD-10-CM | POA: Diagnosis not present

## 2017-09-16 DIAGNOSIS — R739 Hyperglycemia, unspecified: Secondary | ICD-10-CM | POA: Diagnosis not present

## 2017-09-25 ENCOUNTER — Encounter: Payer: Medicare HMO | Admitting: Surgery

## 2017-10-06 DIAGNOSIS — G4733 Obstructive sleep apnea (adult) (pediatric): Secondary | ICD-10-CM | POA: Diagnosis not present

## 2017-11-19 DIAGNOSIS — M9901 Segmental and somatic dysfunction of cervical region: Secondary | ICD-10-CM | POA: Diagnosis not present

## 2017-11-19 DIAGNOSIS — M546 Pain in thoracic spine: Secondary | ICD-10-CM | POA: Diagnosis not present

## 2017-11-19 DIAGNOSIS — M9902 Segmental and somatic dysfunction of thoracic region: Secondary | ICD-10-CM | POA: Diagnosis not present

## 2017-11-19 DIAGNOSIS — M531 Cervicobrachial syndrome: Secondary | ICD-10-CM | POA: Diagnosis not present

## 2017-11-19 DIAGNOSIS — M9903 Segmental and somatic dysfunction of lumbar region: Secondary | ICD-10-CM | POA: Diagnosis not present

## 2017-11-19 DIAGNOSIS — M5136 Other intervertebral disc degeneration, lumbar region: Secondary | ICD-10-CM | POA: Diagnosis not present

## 2017-11-27 DIAGNOSIS — G4733 Obstructive sleep apnea (adult) (pediatric): Secondary | ICD-10-CM | POA: Diagnosis not present

## 2017-12-19 DIAGNOSIS — G4733 Obstructive sleep apnea (adult) (pediatric): Secondary | ICD-10-CM | POA: Diagnosis not present

## 2017-12-23 DIAGNOSIS — M546 Pain in thoracic spine: Secondary | ICD-10-CM | POA: Diagnosis not present

## 2017-12-23 DIAGNOSIS — M9902 Segmental and somatic dysfunction of thoracic region: Secondary | ICD-10-CM | POA: Diagnosis not present

## 2017-12-23 DIAGNOSIS — M9903 Segmental and somatic dysfunction of lumbar region: Secondary | ICD-10-CM | POA: Diagnosis not present

## 2017-12-23 DIAGNOSIS — M531 Cervicobrachial syndrome: Secondary | ICD-10-CM | POA: Diagnosis not present

## 2017-12-23 DIAGNOSIS — M9901 Segmental and somatic dysfunction of cervical region: Secondary | ICD-10-CM | POA: Diagnosis not present

## 2017-12-23 DIAGNOSIS — M5136 Other intervertebral disc degeneration, lumbar region: Secondary | ICD-10-CM | POA: Diagnosis not present

## 2017-12-28 DIAGNOSIS — G4733 Obstructive sleep apnea (adult) (pediatric): Secondary | ICD-10-CM | POA: Diagnosis not present

## 2018-01-14 DIAGNOSIS — G8929 Other chronic pain: Secondary | ICD-10-CM | POA: Diagnosis not present

## 2018-01-14 DIAGNOSIS — G4733 Obstructive sleep apnea (adult) (pediatric): Secondary | ICD-10-CM | POA: Diagnosis not present

## 2018-01-14 DIAGNOSIS — J3089 Other allergic rhinitis: Secondary | ICD-10-CM | POA: Diagnosis not present

## 2018-01-14 DIAGNOSIS — R69 Illness, unspecified: Secondary | ICD-10-CM | POA: Diagnosis not present

## 2018-01-14 DIAGNOSIS — Z9989 Dependence on other enabling machines and devices: Secondary | ICD-10-CM | POA: Diagnosis not present

## 2018-01-14 DIAGNOSIS — M25512 Pain in left shoulder: Secondary | ICD-10-CM | POA: Diagnosis not present

## 2018-01-21 DIAGNOSIS — M546 Pain in thoracic spine: Secondary | ICD-10-CM | POA: Diagnosis not present

## 2018-01-21 DIAGNOSIS — M79602 Pain in left arm: Secondary | ICD-10-CM | POA: Diagnosis not present

## 2018-01-21 DIAGNOSIS — M79601 Pain in right arm: Secondary | ICD-10-CM | POA: Diagnosis not present

## 2018-01-21 DIAGNOSIS — M7542 Impingement syndrome of left shoulder: Secondary | ICD-10-CM | POA: Diagnosis not present

## 2018-01-21 DIAGNOSIS — M7541 Impingement syndrome of right shoulder: Secondary | ICD-10-CM | POA: Diagnosis not present

## 2018-01-28 DIAGNOSIS — M50322 Other cervical disc degeneration at C5-C6 level: Secondary | ICD-10-CM | POA: Diagnosis not present

## 2018-01-28 DIAGNOSIS — M9901 Segmental and somatic dysfunction of cervical region: Secondary | ICD-10-CM | POA: Diagnosis not present

## 2018-01-28 DIAGNOSIS — M9902 Segmental and somatic dysfunction of thoracic region: Secondary | ICD-10-CM | POA: Diagnosis not present

## 2018-01-28 DIAGNOSIS — M5134 Other intervertebral disc degeneration, thoracic region: Secondary | ICD-10-CM | POA: Diagnosis not present

## 2018-01-28 DIAGNOSIS — G4733 Obstructive sleep apnea (adult) (pediatric): Secondary | ICD-10-CM | POA: Diagnosis not present

## 2018-01-28 DIAGNOSIS — M5136 Other intervertebral disc degeneration, lumbar region: Secondary | ICD-10-CM | POA: Diagnosis not present

## 2018-01-28 DIAGNOSIS — M9903 Segmental and somatic dysfunction of lumbar region: Secondary | ICD-10-CM | POA: Diagnosis not present

## 2018-01-30 DIAGNOSIS — R51 Headache: Secondary | ICD-10-CM | POA: Diagnosis not present

## 2018-01-30 DIAGNOSIS — Z1389 Encounter for screening for other disorder: Secondary | ICD-10-CM | POA: Diagnosis not present

## 2018-01-30 DIAGNOSIS — H6593 Unspecified nonsuppurative otitis media, bilateral: Secondary | ICD-10-CM | POA: Diagnosis not present

## 2018-02-25 DIAGNOSIS — G4733 Obstructive sleep apnea (adult) (pediatric): Secondary | ICD-10-CM | POA: Diagnosis not present

## 2018-03-26 DIAGNOSIS — G4733 Obstructive sleep apnea (adult) (pediatric): Secondary | ICD-10-CM | POA: Diagnosis not present

## 2018-03-28 DIAGNOSIS — G4733 Obstructive sleep apnea (adult) (pediatric): Secondary | ICD-10-CM | POA: Diagnosis not present

## 2018-04-08 DIAGNOSIS — M50322 Other cervical disc degeneration at C5-C6 level: Secondary | ICD-10-CM | POA: Diagnosis not present

## 2018-04-08 DIAGNOSIS — M9902 Segmental and somatic dysfunction of thoracic region: Secondary | ICD-10-CM | POA: Diagnosis not present

## 2018-04-08 DIAGNOSIS — M9903 Segmental and somatic dysfunction of lumbar region: Secondary | ICD-10-CM | POA: Diagnosis not present

## 2018-04-08 DIAGNOSIS — M5136 Other intervertebral disc degeneration, lumbar region: Secondary | ICD-10-CM | POA: Diagnosis not present

## 2018-04-08 DIAGNOSIS — M9901 Segmental and somatic dysfunction of cervical region: Secondary | ICD-10-CM | POA: Diagnosis not present

## 2018-04-08 DIAGNOSIS — M5134 Other intervertebral disc degeneration, thoracic region: Secondary | ICD-10-CM | POA: Diagnosis not present

## 2018-04-16 DIAGNOSIS — M9901 Segmental and somatic dysfunction of cervical region: Secondary | ICD-10-CM | POA: Diagnosis not present

## 2018-04-16 DIAGNOSIS — M50322 Other cervical disc degeneration at C5-C6 level: Secondary | ICD-10-CM | POA: Diagnosis not present

## 2018-04-16 DIAGNOSIS — M9903 Segmental and somatic dysfunction of lumbar region: Secondary | ICD-10-CM | POA: Diagnosis not present

## 2018-04-16 DIAGNOSIS — M9902 Segmental and somatic dysfunction of thoracic region: Secondary | ICD-10-CM | POA: Diagnosis not present

## 2018-04-16 DIAGNOSIS — M5134 Other intervertebral disc degeneration, thoracic region: Secondary | ICD-10-CM | POA: Diagnosis not present

## 2018-04-16 DIAGNOSIS — M5136 Other intervertebral disc degeneration, lumbar region: Secondary | ICD-10-CM | POA: Diagnosis not present

## 2018-04-21 DIAGNOSIS — H1012 Acute atopic conjunctivitis, left eye: Secondary | ICD-10-CM | POA: Diagnosis not present

## 2018-04-21 DIAGNOSIS — Z1389 Encounter for screening for other disorder: Secondary | ICD-10-CM | POA: Diagnosis not present

## 2018-04-27 DIAGNOSIS — G4733 Obstructive sleep apnea (adult) (pediatric): Secondary | ICD-10-CM | POA: Diagnosis not present

## 2018-04-29 DIAGNOSIS — M5134 Other intervertebral disc degeneration, thoracic region: Secondary | ICD-10-CM | POA: Diagnosis not present

## 2018-04-29 DIAGNOSIS — M9903 Segmental and somatic dysfunction of lumbar region: Secondary | ICD-10-CM | POA: Diagnosis not present

## 2018-04-29 DIAGNOSIS — M5136 Other intervertebral disc degeneration, lumbar region: Secondary | ICD-10-CM | POA: Diagnosis not present

## 2018-04-29 DIAGNOSIS — M9901 Segmental and somatic dysfunction of cervical region: Secondary | ICD-10-CM | POA: Diagnosis not present

## 2018-04-29 DIAGNOSIS — M50322 Other cervical disc degeneration at C5-C6 level: Secondary | ICD-10-CM | POA: Diagnosis not present

## 2018-04-29 DIAGNOSIS — M9902 Segmental and somatic dysfunction of thoracic region: Secondary | ICD-10-CM | POA: Diagnosis not present

## 2018-05-02 DIAGNOSIS — S6992XA Unspecified injury of left wrist, hand and finger(s), initial encounter: Secondary | ICD-10-CM | POA: Diagnosis not present

## 2018-05-02 DIAGNOSIS — M19032 Primary osteoarthritis, left wrist: Secondary | ICD-10-CM | POA: Diagnosis not present

## 2018-05-27 DIAGNOSIS — M50322 Other cervical disc degeneration at C5-C6 level: Secondary | ICD-10-CM | POA: Diagnosis not present

## 2018-05-27 DIAGNOSIS — M5134 Other intervertebral disc degeneration, thoracic region: Secondary | ICD-10-CM | POA: Diagnosis not present

## 2018-05-27 DIAGNOSIS — M9902 Segmental and somatic dysfunction of thoracic region: Secondary | ICD-10-CM | POA: Diagnosis not present

## 2018-05-27 DIAGNOSIS — M5136 Other intervertebral disc degeneration, lumbar region: Secondary | ICD-10-CM | POA: Diagnosis not present

## 2018-05-27 DIAGNOSIS — M9903 Segmental and somatic dysfunction of lumbar region: Secondary | ICD-10-CM | POA: Diagnosis not present

## 2018-05-27 DIAGNOSIS — M9901 Segmental and somatic dysfunction of cervical region: Secondary | ICD-10-CM | POA: Diagnosis not present

## 2018-05-28 DIAGNOSIS — G4733 Obstructive sleep apnea (adult) (pediatric): Secondary | ICD-10-CM | POA: Diagnosis not present

## 2018-06-01 DIAGNOSIS — H5203 Hypermetropia, bilateral: Secondary | ICD-10-CM | POA: Diagnosis not present

## 2018-06-01 DIAGNOSIS — H524 Presbyopia: Secondary | ICD-10-CM | POA: Diagnosis not present

## 2018-06-24 DIAGNOSIS — M9903 Segmental and somatic dysfunction of lumbar region: Secondary | ICD-10-CM | POA: Diagnosis not present

## 2018-06-24 DIAGNOSIS — M9901 Segmental and somatic dysfunction of cervical region: Secondary | ICD-10-CM | POA: Diagnosis not present

## 2018-06-24 DIAGNOSIS — M50322 Other cervical disc degeneration at C5-C6 level: Secondary | ICD-10-CM | POA: Diagnosis not present

## 2018-06-24 DIAGNOSIS — M5134 Other intervertebral disc degeneration, thoracic region: Secondary | ICD-10-CM | POA: Diagnosis not present

## 2018-06-24 DIAGNOSIS — M9902 Segmental and somatic dysfunction of thoracic region: Secondary | ICD-10-CM | POA: Diagnosis not present

## 2018-06-24 DIAGNOSIS — M5136 Other intervertebral disc degeneration, lumbar region: Secondary | ICD-10-CM | POA: Diagnosis not present

## 2018-06-27 DIAGNOSIS — G4733 Obstructive sleep apnea (adult) (pediatric): Secondary | ICD-10-CM | POA: Diagnosis not present

## 2018-06-29 DIAGNOSIS — G4733 Obstructive sleep apnea (adult) (pediatric): Secondary | ICD-10-CM | POA: Diagnosis not present

## 2018-07-28 DIAGNOSIS — G4733 Obstructive sleep apnea (adult) (pediatric): Secondary | ICD-10-CM | POA: Diagnosis not present

## 2018-08-05 DIAGNOSIS — M5134 Other intervertebral disc degeneration, thoracic region: Secondary | ICD-10-CM | POA: Diagnosis not present

## 2018-08-05 DIAGNOSIS — M9902 Segmental and somatic dysfunction of thoracic region: Secondary | ICD-10-CM | POA: Diagnosis not present

## 2018-08-05 DIAGNOSIS — M5136 Other intervertebral disc degeneration, lumbar region: Secondary | ICD-10-CM | POA: Diagnosis not present

## 2018-08-05 DIAGNOSIS — M50322 Other cervical disc degeneration at C5-C6 level: Secondary | ICD-10-CM | POA: Diagnosis not present

## 2018-08-05 DIAGNOSIS — M9901 Segmental and somatic dysfunction of cervical region: Secondary | ICD-10-CM | POA: Diagnosis not present

## 2018-08-05 DIAGNOSIS — M9903 Segmental and somatic dysfunction of lumbar region: Secondary | ICD-10-CM | POA: Diagnosis not present

## 2018-08-28 DIAGNOSIS — G4733 Obstructive sleep apnea (adult) (pediatric): Secondary | ICD-10-CM | POA: Diagnosis not present

## 2018-09-02 DIAGNOSIS — M5134 Other intervertebral disc degeneration, thoracic region: Secondary | ICD-10-CM | POA: Diagnosis not present

## 2018-09-02 DIAGNOSIS — M9903 Segmental and somatic dysfunction of lumbar region: Secondary | ICD-10-CM | POA: Diagnosis not present

## 2018-09-02 DIAGNOSIS — M9901 Segmental and somatic dysfunction of cervical region: Secondary | ICD-10-CM | POA: Diagnosis not present

## 2018-09-02 DIAGNOSIS — M50322 Other cervical disc degeneration at C5-C6 level: Secondary | ICD-10-CM | POA: Diagnosis not present

## 2018-09-02 DIAGNOSIS — M5136 Other intervertebral disc degeneration, lumbar region: Secondary | ICD-10-CM | POA: Diagnosis not present

## 2018-09-02 DIAGNOSIS — M9902 Segmental and somatic dysfunction of thoracic region: Secondary | ICD-10-CM | POA: Diagnosis not present

## 2018-09-27 DIAGNOSIS — G4733 Obstructive sleep apnea (adult) (pediatric): Secondary | ICD-10-CM | POA: Diagnosis not present

## 2018-10-02 IMAGING — CT CT ABD-PELV W/ CM
2 of 5 series · 15 of 46 positions shown, 17 images · IV contrast (APPLIED)
Comparison: 10/18/2016

CLINICAL DATA: Nausea and vomiting since 8 p.m.. Generalized
abdominal pain. History of irritable bowel syndrome and
appendectomy.

EXAM:
CT ABDOMEN AND PELVIS WITH CONTRAST
TECHNIQUE: Multidetector CT imaging of the abdomen and pelvis was performed
using the standard protocol following bolus administration of
intravenous contrast.
CONTRAST:  100mL UADO6R-YZZ IOPAMIDOL (UADO6R-YZZ) INJECTION 61%

[Series 2: routine abd/pel with · axial · 0.83mm/px · z∈[-1041,-611]mm · 12 of 98 slices shown, 14 images]
[im 6/98  soft-tissue]
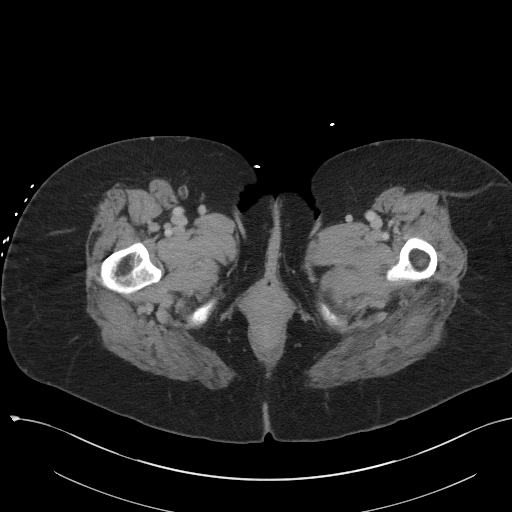
[im 6/98  bone]
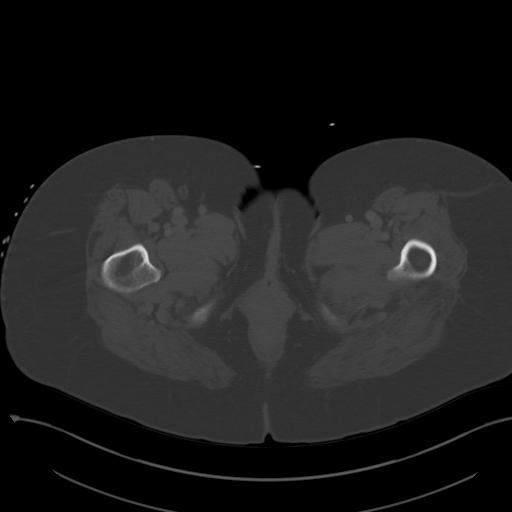
[im 16/98  soft-tissue]
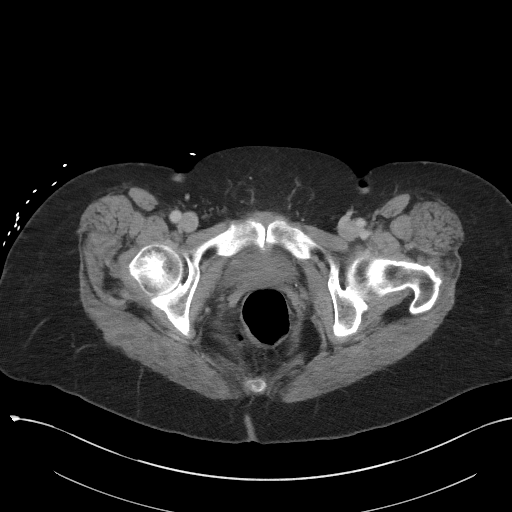
[im 21/98  soft-tissue]
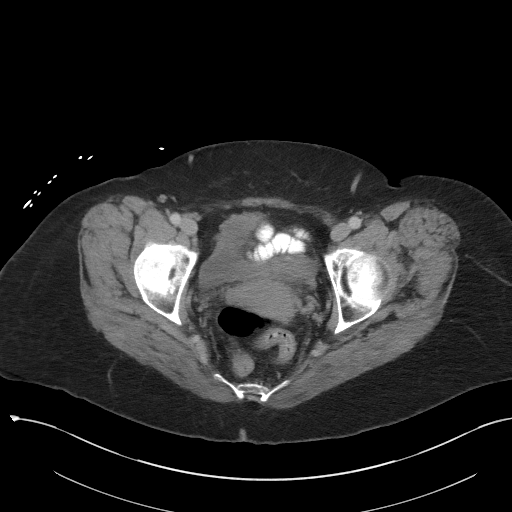
[im 31/98  soft-tissue]
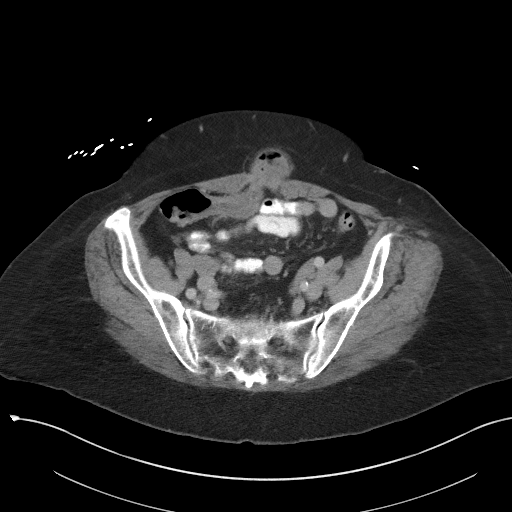
[im 36/98  soft-tissue]
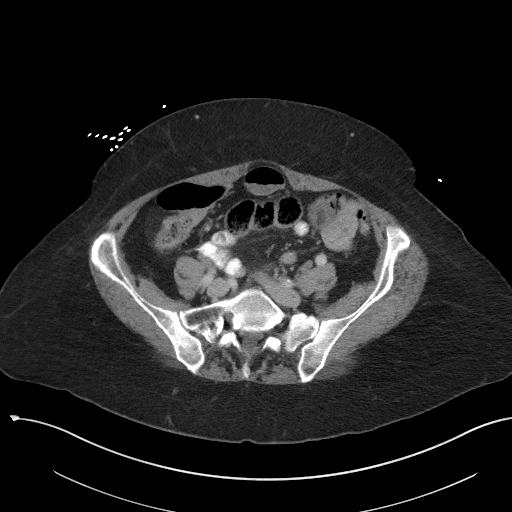
[im 46/98  soft-tissue]
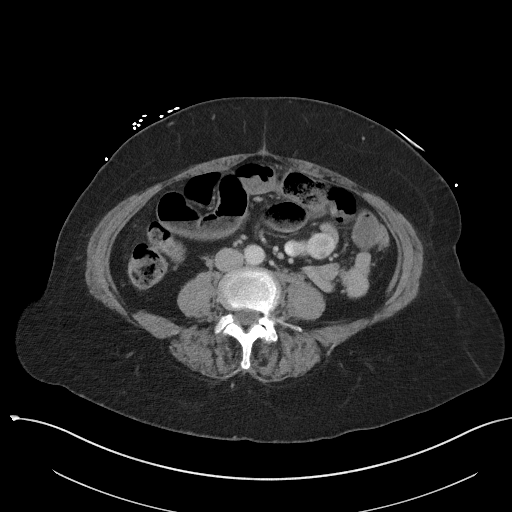
[im 52/98  soft-tissue]
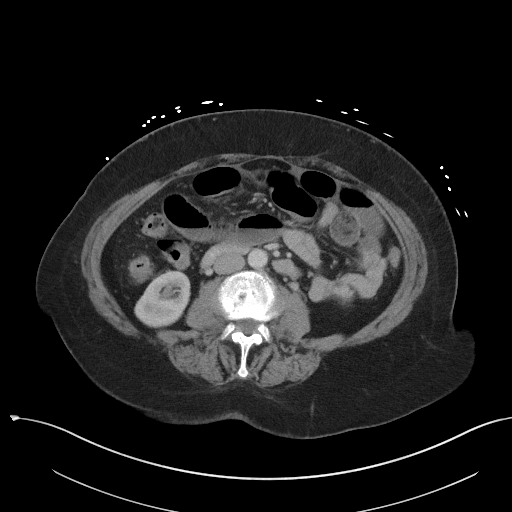
[im 62/98  soft-tissue]
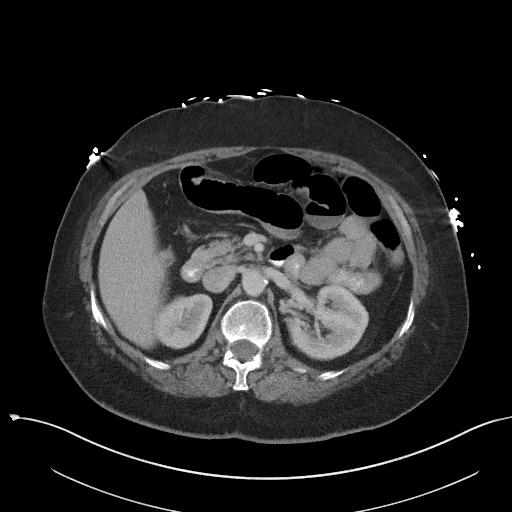
[im 67/98  soft-tissue]
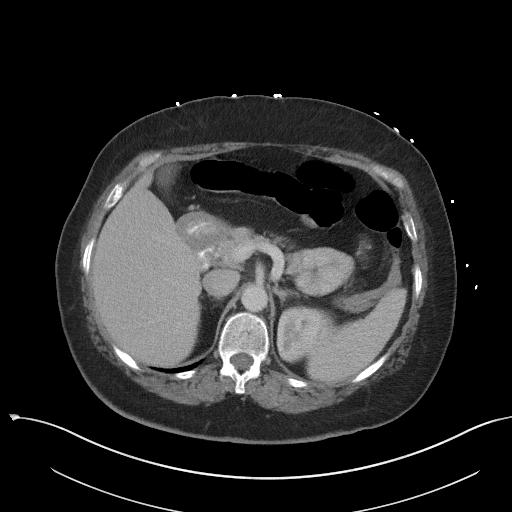
[im 67/98  bone]
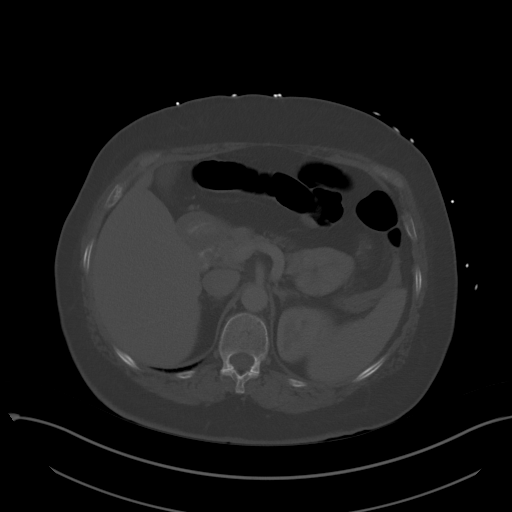
[im 77/98  soft-tissue]
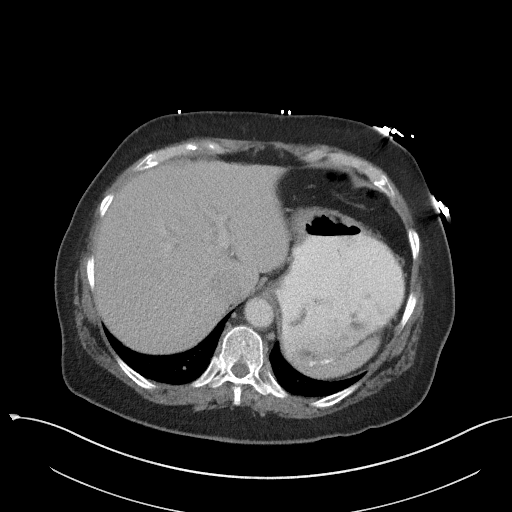
[im 82/98  soft-tissue]
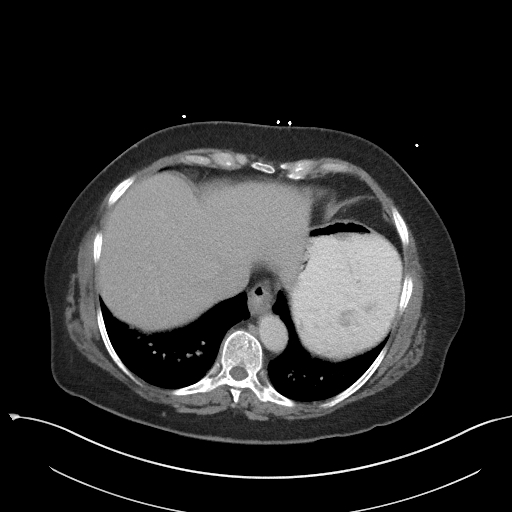
[im 92/98  soft-tissue]
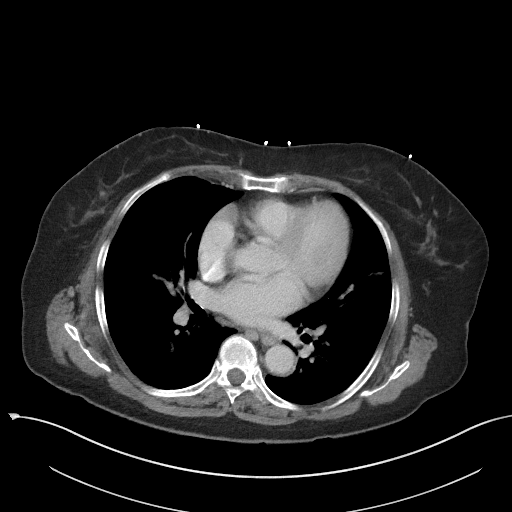

[Series 6: coronal st · coronal · 0.69mm/px · 3 of 87 slices shown]
[im 29/87  soft-tissue]
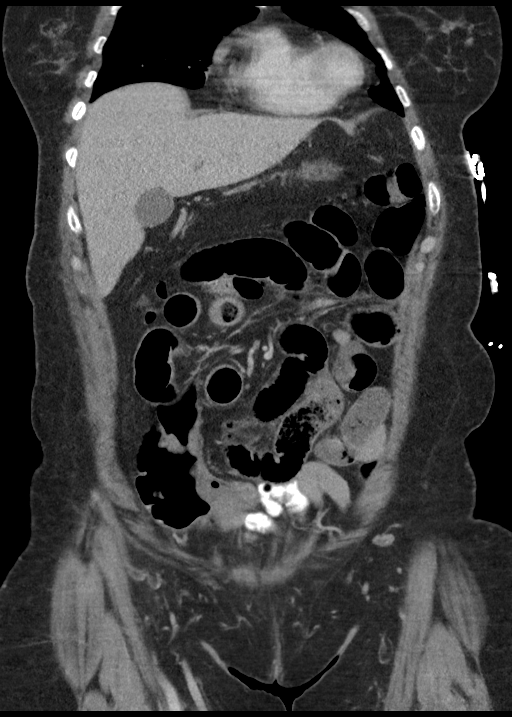
[im 39/87  soft-tissue]
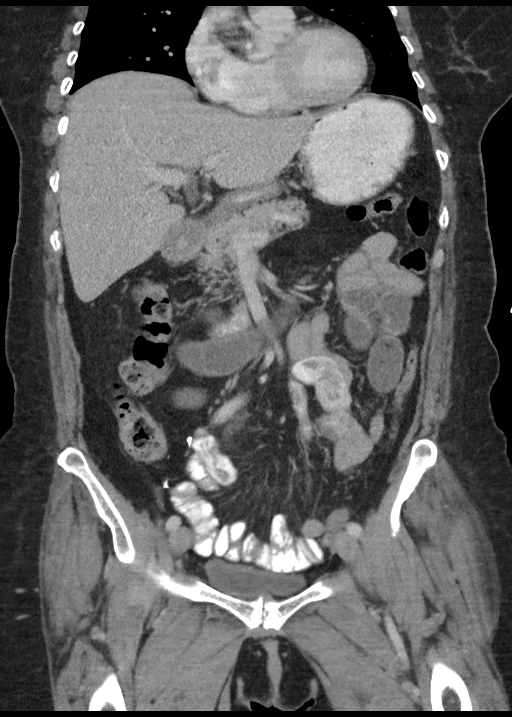
[im 48/87  soft-tissue]
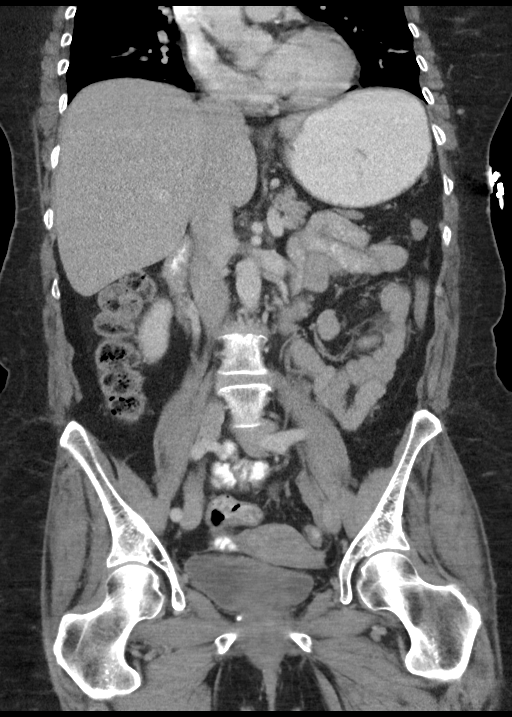

[15 of 46 positions shown; findings below may reference images not displayed]

FINDINGS: Lower chest: Bilateral lower lung opacities likely representing
atelectasis.

Hepatobiliary: No focal liver abnormality is seen. No gallstones,
gallbladder wall thickening, or biliary dilatation.

Pancreas: Unremarkable. No pancreatic ductal dilatation or
surrounding inflammatory changes.

Spleen: Normal in size without focal abnormality.

Adrenals/Urinary Tract: No adrenal gland nodules. Renal nephrograms
are homogeneous and symmetrical. Subcentimeter cysts in both
kidneys. 2 mm stone in the midportion left kidney. No ureteral or
bladder stones. Bladder wall is not thickened.

Stomach/Bowel: There is a midline ventral abdominal wall hernia in
the low pelvic region containing small bowel. There is evidence of
small bowel obstruction caused by the hernia. Proximal small bowel
are mildly dilated with small bowel feces sign and air-fluid levels.
There is mild of wall thickening of the small bowel near the hernia
with some infiltration in the adjacent fat suggesting fat necrosis
and possible strangulation. Colon is decompressed with scattered
stool. Appendix is surgically absent.

Vascular/Lymphatic: Aortic atherosclerosis. No enlarged abdominal or
pelvic lymph nodes.

Reproductive: Uterus and bilateral adnexa are unremarkable.

Other: No free air or free fluid in the abdomen.

Musculoskeletal: Degenerative changes in the spine. No destructive
bone lesions.
IMPRESSION: 1. Midline ventral abdominal wall hernia in the low pelvis
containing small bowel with proximal obstruction. There is evidence
of strangulation with bowel wall thickening and infiltration in the
herniated fat.
2. Nonobstructing stone in the left kidney.
3. Aortic atherosclerosis.

## 2018-10-12 DIAGNOSIS — G4733 Obstructive sleep apnea (adult) (pediatric): Secondary | ICD-10-CM | POA: Diagnosis not present

## 2018-10-14 DIAGNOSIS — R51 Headache: Secondary | ICD-10-CM | POA: Diagnosis not present

## 2018-10-14 DIAGNOSIS — I10 Essential (primary) hypertension: Secondary | ICD-10-CM | POA: Diagnosis not present

## 2018-10-14 DIAGNOSIS — R69 Illness, unspecified: Secondary | ICD-10-CM | POA: Diagnosis not present

## 2018-10-14 DIAGNOSIS — G4700 Insomnia, unspecified: Secondary | ICD-10-CM | POA: Diagnosis not present

## 2018-10-14 DIAGNOSIS — Z9089 Acquired absence of other organs: Secondary | ICD-10-CM | POA: Diagnosis not present

## 2018-10-14 DIAGNOSIS — M75102 Unspecified rotator cuff tear or rupture of left shoulder, not specified as traumatic: Secondary | ICD-10-CM | POA: Diagnosis not present

## 2018-11-02 DIAGNOSIS — R51 Headache: Secondary | ICD-10-CM | POA: Diagnosis not present

## 2018-11-02 DIAGNOSIS — R197 Diarrhea, unspecified: Secondary | ICD-10-CM | POA: Diagnosis not present

## 2018-11-02 DIAGNOSIS — Z1389 Encounter for screening for other disorder: Secondary | ICD-10-CM | POA: Diagnosis not present

## 2018-11-04 ENCOUNTER — Other Ambulatory Visit: Payer: Self-pay | Admitting: Nurse Practitioner

## 2018-11-04 DIAGNOSIS — Z1231 Encounter for screening mammogram for malignant neoplasm of breast: Secondary | ICD-10-CM

## 2018-11-25 DIAGNOSIS — E559 Vitamin D deficiency, unspecified: Secondary | ICD-10-CM | POA: Diagnosis not present

## 2018-11-25 DIAGNOSIS — Z01419 Encounter for gynecological examination (general) (routine) without abnormal findings: Secondary | ICD-10-CM | POA: Diagnosis not present

## 2018-11-25 DIAGNOSIS — Z13220 Encounter for screening for lipoid disorders: Secondary | ICD-10-CM | POA: Diagnosis not present

## 2018-11-25 DIAGNOSIS — Z131 Encounter for screening for diabetes mellitus: Secondary | ICD-10-CM | POA: Diagnosis not present

## 2018-11-25 DIAGNOSIS — Z1321 Encounter for screening for nutritional disorder: Secondary | ICD-10-CM | POA: Diagnosis not present

## 2018-11-25 DIAGNOSIS — Z136 Encounter for screening for cardiovascular disorders: Secondary | ICD-10-CM | POA: Diagnosis not present

## 2018-11-25 DIAGNOSIS — Z1329 Encounter for screening for other suspected endocrine disorder: Secondary | ICD-10-CM | POA: Diagnosis not present

## 2018-12-15 DIAGNOSIS — J988 Other specified respiratory disorders: Secondary | ICD-10-CM | POA: Diagnosis not present

## 2018-12-23 DIAGNOSIS — M9903 Segmental and somatic dysfunction of lumbar region: Secondary | ICD-10-CM | POA: Diagnosis not present

## 2018-12-23 DIAGNOSIS — M546 Pain in thoracic spine: Secondary | ICD-10-CM | POA: Diagnosis not present

## 2018-12-23 DIAGNOSIS — M5136 Other intervertebral disc degeneration, lumbar region: Secondary | ICD-10-CM | POA: Diagnosis not present

## 2018-12-23 DIAGNOSIS — M530 Cervicocranial syndrome: Secondary | ICD-10-CM | POA: Diagnosis not present

## 2018-12-23 DIAGNOSIS — M9902 Segmental and somatic dysfunction of thoracic region: Secondary | ICD-10-CM | POA: Diagnosis not present

## 2018-12-23 DIAGNOSIS — M9901 Segmental and somatic dysfunction of cervical region: Secondary | ICD-10-CM | POA: Diagnosis not present

## 2018-12-30 DIAGNOSIS — R635 Abnormal weight gain: Secondary | ICD-10-CM | POA: Diagnosis not present

## 2019-01-19 DIAGNOSIS — G4733 Obstructive sleep apnea (adult) (pediatric): Secondary | ICD-10-CM | POA: Diagnosis not present

## 2019-01-20 DIAGNOSIS — M9903 Segmental and somatic dysfunction of lumbar region: Secondary | ICD-10-CM | POA: Diagnosis not present

## 2019-01-20 DIAGNOSIS — M5136 Other intervertebral disc degeneration, lumbar region: Secondary | ICD-10-CM | POA: Diagnosis not present

## 2019-01-20 DIAGNOSIS — M546 Pain in thoracic spine: Secondary | ICD-10-CM | POA: Diagnosis not present

## 2019-01-20 DIAGNOSIS — M530 Cervicocranial syndrome: Secondary | ICD-10-CM | POA: Diagnosis not present

## 2019-01-20 DIAGNOSIS — M9902 Segmental and somatic dysfunction of thoracic region: Secondary | ICD-10-CM | POA: Diagnosis not present

## 2019-01-20 DIAGNOSIS — M9901 Segmental and somatic dysfunction of cervical region: Secondary | ICD-10-CM | POA: Diagnosis not present

## 2019-01-27 DIAGNOSIS — R635 Abnormal weight gain: Secondary | ICD-10-CM | POA: Diagnosis not present

## 2019-03-02 ENCOUNTER — Ambulatory Visit (INDEPENDENT_AMBULATORY_CARE_PROVIDER_SITE_OTHER): Payer: Medicare HMO | Admitting: Nurse Practitioner

## 2019-03-02 ENCOUNTER — Encounter: Payer: Self-pay | Admitting: Nurse Practitioner

## 2019-03-02 ENCOUNTER — Other Ambulatory Visit: Payer: Self-pay

## 2019-03-02 DIAGNOSIS — D509 Iron deficiency anemia, unspecified: Secondary | ICD-10-CM | POA: Diagnosis not present

## 2019-03-02 DIAGNOSIS — Z6831 Body mass index (BMI) 31.0-31.9, adult: Secondary | ICD-10-CM

## 2019-03-02 DIAGNOSIS — E210 Primary hyperparathyroidism: Secondary | ICD-10-CM

## 2019-03-02 MED ORDER — PHENTERMINE HCL 37.5 MG PO TABS: 37.5000 mg | ORAL_TABLET | Freq: Every day | ORAL | 0 refills | Status: DC

## 2019-03-02 NOTE — Progress Notes (Signed)
[facility] [place], [place] [address]  Internal MEDICINE  Office Visit Note  Patient Name: Danielle Nash  [id number]  [id number]  Date of Service: 03/18/2019   Complaints/HPI Pt is here for establishment of PCP.   Chief Complaint  Patient presents with  . New Patient (Initial Visit)    Pt switching from OBGYN, weight loss, pt is very tired all the time. from the minute she gets off work, she doesnt sleep at night nothing works, everything she has tried has not worked.  Marland Kitchen Headache    pt hve every once in a while   The patient is here to establish primary care provider. She was seeing GYN provider who was serving as her PCP. The insurance she has is no longer covering the GYN provider. Her main complaint is overwhelming fatigue. She has had sleep study in the past and showed that she had mild sleep apnea. She used her CPAP for a little over a year and she states this never really helped. She stopped using this completely about a week ago.  Most recent labs were done 11/2018, she had mild hypercalcemia. History of hyperparathyroid in the past. Had right side of thyroid and parathyroid removed. Parathyroid hormones have not been checked since recent labs.  She was taking phentermine, prescribed by PCP. Has lost 22 pounds since she started taking this. She has not felt any negative side effects from this medication.    Current Medication: Outpatient Encounter Medications as of 03/02/2019  Medication Sig  . B Complex Vitamins (VITAMIN-B COMPLEX) TABS Take 1 tablet by mouth 1 day or 1 dose.  . butalbital-acetaminophen-caffeine (FIORICET, ESGIC) 50-325-40 MG tablet Take 2 tablets by mouth 2 (two) times daily as needed for migraine.   . Calcium Carb-Cholecalciferol (CALCIUM-VITAMIN D) 500-200 MG-UNIT tablet Take 1 tablet by mouth 1 day or 1 dose.  . Cholecalciferol (VITAMIN D) 2000 units tablet Take 4,000 Units by mouth 1 day or 1 dose.  . folic acid (FOLVITE) 440 MCG  tablet Take 1 tablet by mouth 1 day or 1 dose.  Marland Kitchen HYDROcodone-acetaminophen (NORCO/VICODIN) 5-325 MG tablet Take 1 tablet by mouth every 6 (six) hours as needed for moderate pain.  Marland Kitchen MAGNESIUM PO Take 1 tablet by mouth 1 day or 1 dose.  . Multiple Vitamin (MULTI-VITAMINS) TABS Take 1 tablet by mouth 1 day or 1 dose.  . Omega-3 Fatty Acids (FISH OIL PO) Take 1 capsule by mouth 1 day or 1 dose.  . phentermine (ADIPEX-P) 37.5 MG tablet Take 1 tablet (37.5 mg total) by mouth daily before breakfast.  . Probiotic Product (PROBIOTIC & ACIDOPHILUS EX ST) CAPS Take 1 tablet by mouth 1 day or 1 dose.  Marland Kitchen tiZANidine (ZANAFLEX) 2 MG tablet Take 2 mg by mouth every 6 (six) hours as needed for muscle spasms.  . vitamin B-12 (CYANOCOBALAMIN) 1000 MCG tablet Take 1 tablet by mouth 1 day or 1 dose.  . vitamin E 400 UNIT capsule Take 1 capsule by mouth 1 day or 1 dose.  . [DISCONTINUED] phentermine (ADIPEX-P) 37.5 MG tablet 1 tablet.   No facility-administered encounter medications on file as of 03/02/2019.     Surgical History: Past Surgical History:  Procedure Laterality Date  . INCISIONAL HERNIA REPAIR N/A 09/05/2017   Procedure: HERNIA REPAIR INCISIONAL;  Surgeon: Vickie Epley, MD;  Location: [facility] ORS;  Service: General;  Laterality: N/A;  . LAPAROSCOPIC APPENDECTOMY N/A 10/19/2016   Procedure: APPENDECTOMY LAPAROSCOPIC;  Surgeon: Dia Crawford  III, MD;  Location: [facility] ORS;  Service: General;  Laterality: N/A;  . MEDIAL PARTIAL KNEE REPLACEMENT Bilateral   . THYROID SURGERY      Medical History: Past Medical History:  Diagnosis Date  . Allergy   . Allergy-induced asthma   . Frequent headaches   . Irritable bowel syndrome (IBS)   . Thyroid disease     Family History: Family History  Problem Relation Age of Onset  . Arthritis Mother   . Diabetes Mother   . Heart disease Father   . Breast cancer Sister   . Diabetes Maternal Uncle     Social History   Socioeconomic History  . Marital  status: Married    Spouse name: Not on file  . Number of children: Not on file  . Years of education: Not on file  . Highest education level: Not on file  Occupational History  . Not on file  Social Needs  . Financial resource strain: Not on file  . Food insecurity:    Worry: Not on file    Inability: Not on file  . Transportation needs:    Medical: Not on file    Non-medical: Not on file  Tobacco Use  . Smoking status: Never Smoker  . Smokeless tobacco: Never Used  Substance and Sexual Activity  . Alcohol use: No    Alcohol/week: 0.0 standard drinks    Comment: rare  . Drug use: No  . Sexual activity: Never  Lifestyle  . Physical activity:    Days per week: Not on file    Minutes per session: Not on file  . Stress: Not on file  Relationships  . Social connections:    Talks on phone: Not on file    Gets together: Not on file    Attends religious service: Not on file    Active member of club or organization: Not on file    Attends meetings of clubs or organizations: Not on file    Relationship status: Not on file  . Intimate partner violence:    Fear of current or ex partner: Not on file    Emotionally abused: Not on file    Physically abused: Not on file    Forced sexual activity: Not on file  Other Topics Concern  . Not on file  Social History Narrative  . Not on file     Review of Systems  Constitutional: Positive for fatigue. Negative for chills and unexpected weight change.  HENT: Negative for congestion, postnasal drip, rhinorrhea, sneezing, sore throat and voice change.   Respiratory: Negative for cough, chest tightness, shortness of breath and wheezing.   Cardiovascular: Negative for chest pain and palpitations.  Gastrointestinal: Negative for abdominal pain, constipation, diarrhea, nausea and vomiting.  Endocrine: Negative for cold intolerance, heat intolerance, polydipsia and polyuria.       Documented history of hyperparathyroid disease. Recent labs  showing hypercalcemia.   Musculoskeletal: Negative for arthralgias, back pain, joint swelling and neck pain.  Skin: Negative for rash.  Allergic/Immunologic: Negative for environmental allergies.  Neurological: Negative for dizziness, tremors, numbness and headaches.  Hematological: Negative for adenopathy. Does not bruise/bleed easily.  Psychiatric/Behavioral: Negative for behavioral problems (Depression), sleep disturbance and suicidal ideas. The patient is not nervous/anxious.     Today's Vitals   03/02/19 1522  BP: 138/78  Pulse: 86  Resp: 16  SpO2: 95%  Weight: 159 lb 9.6 oz (72.4 kg)  Height: 5' (1.524 m)   Body mass index is  31.17 kg/m.  Physical Exam Vitals signs and nursing note reviewed.  Constitutional:      General: She is not in acute distress.    Appearance: Normal appearance. She is well-developed. She is not diaphoretic.  HENT:     Head: Normocephalic and atraumatic.     Mouth/Throat:     Mouth: Mucous membranes are moist.     Pharynx: Oropharynx is clear. No oropharyngeal exudate.  Eyes:     Extraocular Movements: Extraocular movements intact.     Pupils: Pupils are equal, round, and reactive to light.  Neck:     Musculoskeletal: Normal range of motion and neck supple.     Thyroid: No thyromegaly.     Vascular: No JVD.     Trachea: No tracheal deviation.  Cardiovascular:     Rate and Rhythm: Normal rate and regular rhythm.     Heart sounds: Normal heart sounds. No murmur. No friction rub. No gallop.   Pulmonary:     Effort: Pulmonary effort is normal. No respiratory distress.     Breath sounds: Normal breath sounds. No wheezing or rales.  Chest:     Chest wall: No tenderness.  Abdominal:     General: Bowel sounds are normal.     Palpations: Abdomen is soft.     Tenderness: There is no abdominal tenderness.  Musculoskeletal: Normal range of motion.  Lymphadenopathy:     Cervical: No cervical adenopathy.  Skin:    General: Skin is warm and dry.   Neurological:     Mental Status: She is alert and oriented to person, place, and time.     Cranial Nerves: No cranial nerve deficit.  Psychiatric:        Behavior: Behavior normal.        Thought Content: Thought content normal.        Judgment: Judgment normal.   Assessment/Plan: 1. Hypercalcemia Recent labs, done through prior PCP showing mild hypercalcemia. Will have patient have new labs drawn, including BMP and intact PTH as well as new thyroid panel. Consider referral to endocrinology as indicated.   2. Primary hyperparathyroidism ([place]) Check labs including thyroid panel and intact pTH. Refer to endocrinology as indicated   3. Iron deficiency anemia, unspecified iron deficiency anemia type Check labs with full anemia panel   4. BMI 31.0-31.9,adult May continue phentermine 37.5mg  daily. Limit calorie intake to 1200 calories and incorporate exercise into daily routine.  - phentermine (ADIPEX-P) 37.5 MG tablet; Take 1 tablet (37.5 mg total) by mouth daily before breakfast.  Dispense: 30 tablet; Refill: 0  General Counseling: Tanette verbalizes understanding of the findings of todays visit and agrees with plan of treatment. I have discussed any further diagnostic evaluation that may be needed or ordered today. We also reviewed her medications today. she has been encouraged to call the office with any questions or concerns that should arise related to todays visit.    Counseling:   There is a liability release in patients' chart. There has been a 10 minute discussion about the side effects including but not limited to elevated blood pressure, anxiety, lack of sleep and dry mouth. Pt understands and will like to start/continue on appetite suppressant at this time. There will be one month RX given at the time of visit with proper follow up. Nova diet plan with restricted calories is given to the pt. Pt understands and agrees with  plan of treatment  This patient was seen by Leretha Pol FNP Collaboration with Dr  Lavera Guise as a part of collaborative care agreement  Meds ordered this encounter  Medications  . phentermine (ADIPEX-P) 37.5 MG tablet    Sig: Take 1 tablet (37.5 mg total) by mouth daily before breakfast.    Dispense:  30 tablet    Refill:  0    Order Specific Question:   Supervising Provider    Answer:   Lavera Guise [[id number]]    Time spent: 30 Minutes

## 2019-03-17 ENCOUNTER — Encounter: Payer: Self-pay | Admitting: Nurse Practitioner

## 2019-03-17 ENCOUNTER — Other Ambulatory Visit: Payer: Self-pay | Admitting: Nurse Practitioner

## 2019-03-17 DIAGNOSIS — E559 Vitamin D deficiency, unspecified: Secondary | ICD-10-CM | POA: Diagnosis not present

## 2019-03-17 DIAGNOSIS — E039 Hypothyroidism, unspecified: Secondary | ICD-10-CM | POA: Diagnosis not present

## 2019-03-17 DIAGNOSIS — D509 Iron deficiency anemia, unspecified: Secondary | ICD-10-CM | POA: Diagnosis not present

## 2019-03-17 DIAGNOSIS — D518 Other vitamin B12 deficiency anemias: Secondary | ICD-10-CM | POA: Diagnosis not present

## 2019-03-17 DIAGNOSIS — D529 Folate deficiency anemia, unspecified: Secondary | ICD-10-CM | POA: Diagnosis not present

## 2019-03-17 DIAGNOSIS — E212 Other hyperparathyroidism: Secondary | ICD-10-CM | POA: Diagnosis not present

## 2019-03-18 DIAGNOSIS — D509 Iron deficiency anemia, unspecified: Secondary | ICD-10-CM | POA: Insufficient documentation

## 2019-03-18 DIAGNOSIS — Z6829 Body mass index (BMI) 29.0-29.9, adult: Secondary | ICD-10-CM | POA: Insufficient documentation

## 2019-03-18 DIAGNOSIS — E210 Primary hyperparathyroidism: Secondary | ICD-10-CM | POA: Insufficient documentation

## 2019-03-18 DIAGNOSIS — Z6831 Body mass index (BMI) 31.0-31.9, adult: Secondary | ICD-10-CM

## 2019-03-18 HISTORY — DX: Hypercalcemia: E83.52

## 2019-03-18 LAB — CBC
Hematocrit: 42.8 % (ref 34.0–46.6)
Hemoglobin: 14.6 g/dL (ref 11.1–15.9)
MCH: 30.2 pg (ref 26.6–33.0)
MCHC: 34.1 g/dL (ref 31.5–35.7)
MCV: 88 fL (ref 79–97)
Platelets: 258 10*3/uL (ref 150–450)
RBC: 4.84 x10E6/uL (ref 3.77–5.28)
RDW: 12.7 % (ref 11.7–15.4)
WBC: 4.6 10*3/uL (ref 3.4–10.8)

## 2019-03-18 LAB — B12 AND FOLATE PANEL
Folate: >20 ng/mL (ref >3.0)
Vitamin B-12: >2000 pg/mL — ABNORMAL HIGH (ref 232–1245)

## 2019-03-18 LAB — BASIC METABOLIC PANEL
BUN/Creatinine Ratio: 11 — ABNORMAL LOW (ref 12–28)
BUN: 8 mg/dL (ref 8–27)
CO2: 22 mmol/L (ref 20–29)
Calcium: 10.3 mg/dL (ref 8.7–10.3)
Chloride: 102 mmol/L (ref 96–106)
Creatinine, Ser: 0.72 mg/dL (ref 0.57–1.00)
GFR calc Af Amer: 99 mL/min/{1.73_m2} (ref >59)
GFR calc non Af Amer: 86 mL/min/{1.73_m2} (ref >59)
Glucose: 86 mg/dL (ref 65–99)
Potassium: 4.2 mmol/L (ref 3.5–5.2)
Sodium: 142 mmol/L (ref 134–144)

## 2019-03-18 LAB — FERRITIN: Ferritin: 226 ng/mL — ABNORMAL HIGH (ref 15–150)

## 2019-03-18 LAB — PARATHYROID HORMONE, INTACT (NO CA): PTH: 42 pg/mL (ref 15–65)

## 2019-03-30 ENCOUNTER — Ambulatory Visit (INDEPENDENT_AMBULATORY_CARE_PROVIDER_SITE_OTHER): Payer: Medicare HMO | Admitting: Nurse Practitioner

## 2019-03-30 ENCOUNTER — Other Ambulatory Visit: Payer: Self-pay

## 2019-03-30 ENCOUNTER — Encounter: Payer: Self-pay | Admitting: Nurse Practitioner

## 2019-03-30 VITALS — Resp 16 | Ht 60.0 in | Wt 153.4 lb

## 2019-03-30 DIAGNOSIS — G8929 Other chronic pain: Secondary | ICD-10-CM

## 2019-03-30 DIAGNOSIS — G4719 Other hypersomnia: Secondary | ICD-10-CM

## 2019-03-30 DIAGNOSIS — E210 Primary hyperparathyroidism: Secondary | ICD-10-CM | POA: Diagnosis not present

## 2019-03-30 DIAGNOSIS — M25511 Pain in right shoulder: Secondary | ICD-10-CM | POA: Diagnosis not present

## 2019-03-30 DIAGNOSIS — Z6831 Body mass index (BMI) 31.0-31.9, adult: Secondary | ICD-10-CM

## 2019-03-30 MED ORDER — METHYLPREDNISOLONE 4 MG PO TBPK: ORAL_TABLET | ORAL | 0 refills | Status: DC

## 2019-03-30 MED ORDER — PHENTERMINE HCL 37.5 MG PO TABS: 37.5000 mg | ORAL_TABLET | Freq: Every day | ORAL | 0 refills | Status: DC

## 2019-03-30 NOTE — Progress Notes (Signed)
[facility] [place],  [address]  Internal MEDICINE  Telephone Visit  Patient Name: Danielle Nash  [id number]  [id number]  Date of Service: 04/14/2019  I connected with the patient at 1:50pm by telephone and verified the patients identity using two identifiers.   I discussed the limitations, risks, security and privacy concerns of performing an evaluation and management service by telephone and the availability of in person appointments. I also discussed with the patient that there may be a patient responsible charge related to the service.  The patient expressed understanding and agrees to proceed.    Chief Complaint  Patient presents with  . Telephone Assessment  . Telephone Screen  . Medical Management of Chronic Issues    weight Management   . Quality Metric Gaps    colonscopy and BMD    The patient has been contacted via webcam for follow up visit due to concerns for spread of novel coronavirus. Today, she c/o right shoulder pain which has been getting worse over the past few years. ROM and strength in right shoulder and arm continues to decrease. She had x-ray done 01/2018 which did show degenerative changes as well as goat's head osteophyte in the [facility] joint. Offered to get MRI or refer to orthopedics for further evaluation. She sttes that she does not wish to do theses right now. If she changes her mind, she will let me know. She does take naproxen when needed for pain. This helps some, but causes her some nausea.  She is also following up regarding weight management. She is taking phentermine to help suppress appetite. She continues to do well with this. She has lost six pounds since her last visit. She has no negative side effects to report associated with phentermine.  She states that she continues to have overwhelming fatigue. Labs were good overall. She states that she has been diagnosed with OSA in the past, however, does not use CPAP. Was never  able to find a mask which she could sleep with. Insurance did not let her keep CPAP as she was considered noncompliant.       Current Medication: Outpatient Encounter Medications as of 03/30/2019  Medication Sig  . B Complex Vitamins (VITAMIN-B COMPLEX) TABS Take 1 tablet by mouth 1 day or 1 dose.  . butalbital-acetaminophen-caffeine (FIORICET, ESGIC) 50-325-40 MG tablet Take 2 tablets by mouth 2 (two) times daily as needed for migraine.   . Cholecalciferol (VITAMIN D) 2000 units tablet Take 4,000 Units by mouth 1 day or 1 dose.  . folic acid (FOLVITE) 341 MCG tablet Take 1 tablet by mouth 1 day or 1 dose.  Marland Kitchen MAGNESIUM PO Take 1 tablet by mouth 1 day or 1 dose.  . Multiple Vitamin (MULTI-VITAMINS) TABS Take 1 tablet by mouth 1 day or 1 dose.  . Omega-3 Fatty Acids (FISH OIL PO) Take 1 capsule by mouth 1 day or 1 dose.  . phentermine (ADIPEX-P) 37.5 MG tablet Take 1 tablet (37.5 mg total) by mouth daily before breakfast.  . Probiotic Product (PROBIOTIC & ACIDOPHILUS EX ST) CAPS Take 1 tablet by mouth 1 day or 1 dose.  Marland Kitchen tiZANidine (ZANAFLEX) 2 MG tablet Take 2 mg by mouth every 6 (six) hours as needed for muscle spasms.  . vitamin B-12 (CYANOCOBALAMIN) 1000 MCG tablet Take 1 tablet by mouth 1 day or 1 dose.  . vitamin E 400 UNIT capsule Take 1 capsule by mouth 1 day or 1 dose.  . [DISCONTINUED]  phentermine (ADIPEX-P) 37.5 MG tablet Take 1 tablet (37.5 mg total) by mouth daily before breakfast.  . Calcium Carb-Cholecalciferol (CALCIUM-VITAMIN D) 500-200 MG-UNIT tablet Take 1 tablet by mouth 1 day or 1 dose.  . methylPREDNISolone (MEDROL) 4 MG TBPK tablet Take by mouth as directed for 6 days  . [DISCONTINUED] HYDROcodone-acetaminophen (NORCO/VICODIN) 5-325 MG tablet Take 1 tablet by mouth every 6 (six) hours as needed for moderate pain. (Patient not taking: Reported on 03/30/2019)   No facility-administered encounter medications on file as of 03/30/2019.     Surgical History: Past Surgical  History:  Procedure Laterality Date  . INCISIONAL HERNIA REPAIR N/A 09/05/2017   Procedure: HERNIA REPAIR INCISIONAL;  Surgeon: Vickie Epley, MD;  Location: [facility] ORS;  Service: General;  Laterality: N/A;  . LAPAROSCOPIC APPENDECTOMY N/A 10/19/2016   Procedure: APPENDECTOMY LAPAROSCOPIC;  Surgeon: Dia Crawford III, MD;  Location: [facility] ORS;  Service: General;  Laterality: N/A;  . MEDIAL PARTIAL KNEE REPLACEMENT Bilateral   . THYROID SURGERY      Medical History: Past Medical History:  Diagnosis Date  . Allergy   . Allergy-induced asthma   . Frequent headaches   . Irritable bowel syndrome (IBS)   . Thyroid disease     Family History: Family History  Problem Relation Age of Onset  . Arthritis Mother   . Diabetes Mother   . Heart disease Father   . Breast cancer Sister   . Diabetes Maternal Uncle     Social History   Socioeconomic History  . Marital status: Married    Spouse name: Not on file  . Number of children: Not on file  . Years of education: Not on file  . Highest education level: Not on file  Occupational History  . Not on file  Social Needs  . Financial resource strain: Not on file  . Food insecurity:    Worry: Not on file    Inability: Not on file  . Transportation needs:    Medical: Not on file    Non-medical: Not on file  Tobacco Use  . Smoking status: Never Smoker  . Smokeless tobacco: Never Used  Substance and Sexual Activity  . Alcohol use: No    Alcohol/week: 0.0 standard drinks    Comment: rare  . Drug use: No  . Sexual activity: Never  Lifestyle  . Physical activity:    Days per week: Not on file    Minutes per session: Not on file  . Stress: Not on file  Relationships  . Social connections:    Talks on phone: Not on file    Gets together: Not on file    Attends religious service: Not on file    Active member of club or organization: Not on file    Attends meetings of clubs or organizations: Not on file    Relationship status: Not on  file  . Intimate partner violence:    Fear of current or ex partner: Not on file    Emotionally abused: Not on file    Physically abused: Not on file    Forced sexual activity: Not on file  Other Topics Concern  . Not on file  Social History Narrative  . Not on file      Review of Systems  Constitutional: Positive for fatigue. Negative for chills and unexpected weight change.  HENT: Negative for congestion, postnasal drip, rhinorrhea, sneezing, sore throat and voice change.   Respiratory: Negative for cough, chest tightness, shortness of breath  and wheezing.   Cardiovascular: Negative for chest pain and palpitations.  Gastrointestinal: Negative for abdominal pain, constipation, diarrhea, nausea and vomiting.  Endocrine: Negative for cold intolerance, heat intolerance, polydipsia and polyuria.       Recent labs showing normal parathyroid function with stable calcium levels.   Musculoskeletal: Positive for arthralgias, joint swelling and myalgias. Negative for back pain and neck pain.       Right shoulder pain.   Skin: Negative for rash.  Allergic/Immunologic: Negative for environmental allergies.  Neurological: Negative for dizziness, tremors, numbness and headaches.  Hematological: Negative for adenopathy. Does not bruise/bleed easily.  Psychiatric/Behavioral: Negative for behavioral problems (Depression), sleep disturbance and suicidal ideas. The patient is not nervous/anxious.     Today's Vitals   03/30/19 1343  Resp: 16  Weight: 153 lb 6.4 oz (69.6 kg)  Height: 5' (1.524 m)   Body mass index is 29.96 kg/m.  Observation/Objective:  The patient is alert and oriented. She does appear to be in mild pain, guarding the right shoulder with movement. No defrmities can be seen on camera.  She is currently in no acute distress.    Assessment/Plan: 1. Excessive daytime sleepiness Refer patient to Dr. Devona Konig for evaluation and treatment of sleep apnea.  - Ambulatory  referral to Pulmonology  2. BMI 31.0-31.9,adult Improving. Continue phentermine 37.5mg  tablets daily. Limit calorie intake to 1200 calories per day. Continue with regular exercise.  - phentermine (ADIPEX-P) 37.5 MG tablet; Take 1 tablet (37.5 mg total) by mouth daily before breakfast.  Dispense: 30 tablet; Refill: 0  3. Chronic right shoulder pain Trial of medrol taper. Take as directed for 6 days. Recommend use of tylenol and NSAIDs as needed and as indicated. Will consider referral to orthopedics in future.  - methylPREDNISolone (MEDROL) 4 MG TBPK tablet; Take by mouth as directed for 6 days  Dispense: 21 tablet; Refill: 0  4. Hypercalcemia Reviewed recent labs showing normal calcium level. Will monitor closely.   5. Primary hyperparathyroidism ([place]) Recent parathyroid panel normal. Will continue to monitor closely.    General Counseling: Danielle Nash understanding of the findings of today's phone visit and agrees with plan of treatment. I have discussed any further diagnostic evaluation that may be needed or ordered today. We also reviewed her medications today. she has been encouraged to call the office with any questions or concerns that should arise related to todays visit.   There is a liability release in patients' chart. There has been a 10 minute discussion about the side effects including but not limited to elevated blood pressure, anxiety, lack of sleep and dry mouth. Pt understands and will like to start/continue on appetite suppressant at this time. There will be one month RX given at the time of visit with proper follow up. Nova diet plan with restricted calories is given to the pt. Pt understands and agrees with  plan of treatment  This patient was seen by Leretha Pol FNP Collaboration with Dr Lavera Guise as a part of collaborative care agreement  Orders Placed This Encounter  Procedures  . Ambulatory referral to Pulmonology    Meds ordered this encounter   Medications  . methylPREDNISolone (MEDROL) 4 MG TBPK tablet    Sig: Take by mouth as directed for 6 days    Dispense:  21 tablet    Refill:  0    Order Specific Question:   Supervising Provider    Answer:   Lavera Guise [place]  . phentermine (ADIPEX-P)  37.5 MG tablet    Sig: Take 1 tablet (37.5 mg total) by mouth daily before breakfast.    Dispense:  30 tablet    Refill:  0    Order Specific Question:   Supervising Provider    Answer:   Lavera Guise [[id number]]    Time spent: 37 Minutes    Dr Lavera Guise Internal medicine

## 2019-04-14 DIAGNOSIS — L578 Other skin changes due to chronic exposure to nonionizing radiation: Secondary | ICD-10-CM | POA: Diagnosis not present

## 2019-04-14 DIAGNOSIS — G4719 Other hypersomnia: Secondary | ICD-10-CM | POA: Insufficient documentation

## 2019-04-14 DIAGNOSIS — G8929 Other chronic pain: Secondary | ICD-10-CM | POA: Insufficient documentation

## 2019-04-14 DIAGNOSIS — L570 Actinic keratosis: Secondary | ICD-10-CM | POA: Diagnosis not present

## 2019-04-14 DIAGNOSIS — M25511 Pain in right shoulder: Secondary | ICD-10-CM

## 2019-04-14 DIAGNOSIS — L821 Other seborrheic keratosis: Secondary | ICD-10-CM | POA: Diagnosis not present

## 2019-04-14 DIAGNOSIS — Z85828 Personal history of other malignant neoplasm of skin: Secondary | ICD-10-CM | POA: Diagnosis not present

## 2019-04-14 DIAGNOSIS — L918 Other hypertrophic disorders of the skin: Secondary | ICD-10-CM | POA: Diagnosis not present

## 2019-05-31 ENCOUNTER — Ambulatory Visit (INDEPENDENT_AMBULATORY_CARE_PROVIDER_SITE_OTHER): Payer: Medicare HMO | Admitting: Nurse Practitioner

## 2019-05-31 ENCOUNTER — Encounter: Payer: Self-pay | Admitting: Nurse Practitioner

## 2019-05-31 ENCOUNTER — Telehealth: Payer: Self-pay

## 2019-05-31 ENCOUNTER — Other Ambulatory Visit: Payer: Self-pay

## 2019-05-31 VITALS — BP 132/81 | HR 70 | Resp 16 | Ht 60.0 in | Wt 152.6 lb

## 2019-05-31 DIAGNOSIS — Z6829 Body mass index (BMI) 29.0-29.9, adult: Secondary | ICD-10-CM | POA: Diagnosis not present

## 2019-05-31 DIAGNOSIS — F5101 Primary insomnia: Secondary | ICD-10-CM | POA: Diagnosis not present

## 2019-05-31 DIAGNOSIS — G4719 Other hypersomnia: Secondary | ICD-10-CM | POA: Diagnosis not present

## 2019-05-31 DIAGNOSIS — R69 Illness, unspecified: Secondary | ICD-10-CM | POA: Diagnosis not present

## 2019-05-31 DIAGNOSIS — G8929 Other chronic pain: Secondary | ICD-10-CM

## 2019-05-31 DIAGNOSIS — M25511 Pain in right shoulder: Secondary | ICD-10-CM | POA: Diagnosis not present

## 2019-05-31 MED ORDER — ZOLPIDEM TARTRATE 5 MG PO TABS: ORAL_TABLET | ORAL | 1 refills | Status: DC

## 2019-05-31 MED ORDER — PHENDIMETRAZINE TARTRATE ER 105 MG PO CP24: 1.0000 | ORAL_CAPSULE | Freq: Every day | ORAL | 1 refills | Status: DC

## 2019-05-31 NOTE — Telephone Encounter (Signed)
Spoke with said go head send phentermine

## 2019-05-31 NOTE — Telephone Encounter (Signed)
The only other thing that is cheaper is the phentermine. She said this is not working for her any longer.

## 2019-05-31 NOTE — Progress Notes (Signed)
[facility] [place], [place] [address]  Internal MEDICINE  Office Visit Note  Patient Name: Danielle Nash  [id number]  [id number]  Date of Service: 05/31/2019  Chief Complaint  Patient presents with  . Shoulder Pain  . Insomnia  . Weight Loss    phentermine is not working  . Thyroid Problem    The patient is here for routine follow up visit. oday, she c/o right shoulder pain which has been getting worse over the past few years. ROM and strength in right shoulder and arm continues to decrease. She had x-ray done 01/2018 which did show degenerative changes as well as goat's head osteophyte in the [facility] joint. Did do a medrol dose pack for six days. She states this did not help, in fact, pain is getting worse. She requests referral to orthopedics for further evaluation. She is also following up regarding weight management. She is taking phentermine to help suppress appetite. She has not gained or lost any weight since she was last seen. She does not feel that the phentermine is working for her any longer. Has never been on any other medication to help with weight management.  She states that she continues to have overwhelming fatigue. Labs were good overall. She states that she has been diagnosed with OSA in the past, however, does not use CPAP. Was never able to find a mask which she could sleep with. Insurance did not let her keep CPAP as she was considered noncompliant. She was referred to Dr. Devona Konig at her last visit do help with further evaluation and treatment of excessive daytime sleepiness. She never received appointment for this referral. She states that she ia having a lot of trouble sleeping. Unable to fall asleep at all. States that she has tried trazodone in the past. Made her feel very congested and was not able to sleep due to that.        Current Medication: Outpatient Encounter Medications as of 05/31/2019  Medication Sig  . B Complex Vitamins  (VITAMIN-B COMPLEX) TABS Take 1 tablet by mouth 1 day or 1 dose.  . butalbital-acetaminophen-caffeine (FIORICET, ESGIC) 50-325-40 MG tablet Take 2 tablets by mouth 2 (two) times daily as needed for migraine.   . Calcium Carb-Cholecalciferol (CALCIUM-VITAMIN D) 500-200 MG-UNIT tablet Take 1 tablet by mouth 1 day or 1 dose.  . Cholecalciferol (VITAMIN D) 2000 units tablet Take 4,000 Units by mouth 1 day or 1 dose.  . folic acid (FOLVITE) 962 MCG tablet Take 1 tablet by mouth 1 day or 1 dose.  Marland Kitchen MAGNESIUM PO Take 1 tablet by mouth 1 day or 1 dose.  . Multiple Vitamin (MULTI-VITAMINS) TABS Take 1 tablet by mouth 1 day or 1 dose.  . Omega-3 Fatty Acids (FISH OIL PO) Take 1 capsule by mouth 1 day or 1 dose.  . Probiotic Product (PROBIOTIC & ACIDOPHILUS EX ST) CAPS Take 1 tablet by mouth 1 day or 1 dose.  Marland Kitchen tiZANidine (ZANAFLEX) 2 MG tablet Take 2 mg by mouth every 6 (six) hours as needed for muscle spasms.  . vitamin B-12 (CYANOCOBALAMIN) 1000 MCG tablet Take 1 tablet by mouth 1 day or 1 dose.  . vitamin E 400 UNIT capsule Take 1 capsule by mouth 1 day or 1 dose.  . [DISCONTINUED] phentermine (ADIPEX-P) 37.5 MG tablet Take 1 tablet (37.5 mg total) by mouth daily before breakfast.  . Phendimetrazine Tartrate 105 MG CP24 Take 1 capsule (105 mg total) by mouth daily.  Marland Kitchen  zolpidem (AMBIEN) 5 MG tablet Take 1/2 to 1 tablet po QHS prn insomnia  . [DISCONTINUED] methylPREDNISolone (MEDROL) 4 MG TBPK tablet Take by mouth as directed for 6 days (Patient not taking: Reported on 05/31/2019)   No facility-administered encounter medications on file as of 05/31/2019.     Surgical History: Past Surgical History:  Procedure Laterality Date  . INCISIONAL HERNIA REPAIR N/A 09/05/2017   Procedure: HERNIA REPAIR INCISIONAL;  Surgeon: Vickie Epley, MD;  Location: [facility] ORS;  Service: General;  Laterality: N/A;  . LAPAROSCOPIC APPENDECTOMY N/A 10/19/2016   Procedure: APPENDECTOMY LAPAROSCOPIC;  Surgeon: Dia Crawford III,  MD;  Location: [facility] ORS;  Service: General;  Laterality: N/A;  . MEDIAL PARTIAL KNEE REPLACEMENT Bilateral   . THYROID SURGERY      Medical History: Past Medical History:  Diagnosis Date  . Allergy   . Allergy-induced asthma   . Frequent headaches   . Irritable bowel syndrome (IBS)   . Thyroid disease     Family History: Family History  Problem Relation Age of Onset  . Arthritis Mother   . Diabetes Mother   . Heart disease Father   . Breast cancer Sister   . Diabetes Maternal Uncle     Social History   Socioeconomic History  . Marital status: Married    Spouse name: Not on file  . Number of children: Not on file  . Years of education: Not on file  . Highest education level: Not on file  Occupational History  . Not on file  Social Needs  . Financial resource strain: Not on file  . Food insecurity    Worry: Not on file    Inability: Not on file  . Transportation needs    Medical: Not on file    Non-medical: Not on file  Tobacco Use  . Smoking status: Never Smoker  . Smokeless tobacco: Never Used  Substance and Sexual Activity  . Alcohol use: No    Alcohol/week: 0.0 standard drinks  . Drug use: No  . Sexual activity: Never  Lifestyle  . Physical activity    Days per week: Not on file    Minutes per session: Not on file  . Stress: Not on file  Relationships  . Social Herbalist on phone: Not on file    Gets together: Not on file    Attends religious service: Not on file    Active member of club or organization: Not on file    Attends meetings of clubs or organizations: Not on file    Relationship status: Not on file  . Intimate partner violence    Fear of current or ex partner: Not on file    Emotionally abused: Not on file    Physically abused: Not on file    Forced sexual activity: Not on file  Other Topics Concern  . Not on file  Social History Narrative  . Not on file      Review of Systems  Constitutional: Positive for fatigue.  Negative for chills and unexpected weight change.  HENT: Negative for congestion, postnasal drip, rhinorrhea, sneezing, sore throat and voice change.   Respiratory: Negative for cough, chest tightness, shortness of breath and wheezing.   Cardiovascular: Negative for chest pain and palpitations.  Gastrointestinal: Negative for abdominal pain, constipation, diarrhea, nausea and vomiting.  Endocrine: Negative for cold intolerance, heat intolerance, polydipsia and polyuria.       Recent labs showing normal parathyroid function with stable  calcium levels.   Musculoskeletal: Positive for arthralgias, joint swelling and myalgias. Negative for back pain and neck pain.       Worsening right shoulder pain. Now starting to get worse in left shoulder pain.   Skin: Negative for rash.  Allergic/Immunologic: Negative for environmental allergies.  Neurological: Negative for dizziness, tremors, numbness and headaches.  Hematological: Negative for adenopathy. Does not bruise/bleed easily.  Psychiatric/Behavioral: Negative for behavioral problems (Depression), sleep disturbance and suicidal ideas. The patient is not nervous/anxious.    Today's Vitals   05/31/19 1351  BP: 132/81  Pulse: 70  Resp: 16  SpO2: 98%  Weight: 152 lb 9.6 oz (69.2 kg)  Height: 5' (1.524 m)   Body mass index is 29.8 kg/m.  Physical Exam Vitals signs and nursing note reviewed.  Constitutional:      General: She is not in acute distress.    Appearance: Normal appearance. She is well-developed. She is not diaphoretic.  HENT:     Head: Normocephalic and atraumatic.     Mouth/Throat:     Mouth: Mucous membranes are moist.     Pharynx: Oropharynx is clear. No oropharyngeal exudate.  Eyes:     Extraocular Movements: Extraocular movements intact.     Pupils: Pupils are equal, round, and reactive to light.  Neck:     Musculoskeletal: Normal range of motion and neck supple.     Thyroid: No thyromegaly.     Vascular: No JVD.      Trachea: No tracheal deviation.  Cardiovascular:     Rate and Rhythm: Normal rate and regular rhythm.     Heart sounds: Normal heart sounds. No murmur. No friction rub. No gallop.   Pulmonary:     Effort: Pulmonary effort is normal. No respiratory distress.     Breath sounds: Normal breath sounds. No wheezing or rales.  Chest:     Chest wall: No tenderness.  Abdominal:     General: Bowel sounds are normal.     Palpations: Abdomen is soft.     Tenderness: There is no abdominal tenderness.  Musculoskeletal: Normal range of motion.        General: Tenderness present.     Comments: Severe right shoulder tenderness with moderate reduction in rom and strength. No visible or palpable abnormalities or deformities noted.   Lymphadenopathy:     Cervical: No cervical adenopathy.  Skin:    General: Skin is warm and dry.  Neurological:     Mental Status: She is alert and oriented to person, place, and time.     Cranial Nerves: No cranial nerve deficit.  Psychiatric:        Behavior: Behavior normal.        Thought Content: Thought content normal.        Judgment: Judgment normal.   Assessment/Plan:  1. Chronic right shoulder pain Getting worse and medrol dose pack did not help. Refer to orthopedics for further evaluation and treatment.  - Ambulatory referral to Orthopedic Surgery  2. Excessive daytime sleepiness Refer to Dr. Devona Konig for further evaluation and treatment.  - Ambulatory referral to Pulmonology  3. Body mass index 29.0-29.9, adult Change phentermine to bontril SR 105mg  daily. Limit calorie intake to 1200-1500 calories per day and increase exercise.  - Phendimetrazine Tartrate 105 MG CP24; Take 1 capsule (105 mg total) by mouth daily.  Dispense: 30 each; Refill: 1  4. Primary insomnia Add ambien 5mg .  Recommend she take 1/2 tablet at bedtime as needed. May increase  to 1 tablet as tolerated and as needed  - zolpidem (AMBIEN) 5 MG tablet; Take 1/2 to 1 tablet po QHS prn  insomnia  Dispense: 15 tablet; Refill: 1    General Counseling: Danielle Nash verbalizes understanding of the findings of todays visit and agrees with plan of treatment. I have discussed any further diagnostic evaluation that may be needed or ordered today. We also reviewed her medications today. she has been encouraged to call the office with any questions or concerns that should arise related to todays visit.   Patient has sign and symptoms of OSA ( disturbed sleep, excessive fatigue during the day, uncontrolled bp and abnormal BMI). Baseline sleep study is ordered to further look into this. Long term complications of OSA was addressed with the patient.  This patient was seen by Leretha Pol FNP Collaboration with Dr Lavera Guise as a part of collaborative care agreement  Orders Placed This Encounter  Procedures  . Ambulatory referral to Orthopedic Surgery  . Ambulatory referral to Pulmonology    Meds ordered this encounter  Medications  . Phendimetrazine Tartrate 105 MG CP24    Sig: Take 1 capsule (105 mg total) by mouth daily.    Dispense:  30 each    Refill:  1    Order Specific Question:   Supervising Provider    Answer:   Lavera Guise [[id number]]  . zolpidem (AMBIEN) 5 MG tablet    Sig: Take 1/2 to 1 tablet po QHS prn insomnia    Dispense:  15 tablet    Refill:  1    Order Specific Question:   Supervising Provider    Answer:   Lavera Guise [[id number]]    Time spent: 39 Minutes      Dr Lavera Guise Internal medicine

## 2019-06-01 ENCOUNTER — Other Ambulatory Visit: Payer: Self-pay | Admitting: Nurse Practitioner

## 2019-06-01 DIAGNOSIS — F5101 Primary insomnia: Secondary | ICD-10-CM

## 2019-06-01 DIAGNOSIS — Z6829 Body mass index (BMI) 29.0-29.9, adult: Secondary | ICD-10-CM

## 2019-06-01 MED ORDER — PHENTERMINE HCL 37.5 MG PO TABS: 37.5000 mg | ORAL_TABLET | Freq: Every day | ORAL | 1 refills | Status: DC

## 2019-06-01 NOTE — Progress Notes (Signed)
Changed bontril back to phentermine daily due to expense. Sent to patient's pharmacy.

## 2019-06-01 NOTE — Telephone Encounter (Signed)
Changed bontril back to phentermine daily due to expense. Sent to patient's pharmacy.

## 2019-06-09 DIAGNOSIS — M19011 Primary osteoarthritis, right shoulder: Secondary | ICD-10-CM | POA: Diagnosis not present

## 2019-06-09 DIAGNOSIS — M19012 Primary osteoarthritis, left shoulder: Secondary | ICD-10-CM | POA: Diagnosis not present

## 2019-06-09 DIAGNOSIS — M7541 Impingement syndrome of right shoulder: Secondary | ICD-10-CM | POA: Diagnosis not present

## 2019-06-09 DIAGNOSIS — M7542 Impingement syndrome of left shoulder: Secondary | ICD-10-CM | POA: Diagnosis not present

## 2019-06-14 ENCOUNTER — Other Ambulatory Visit: Payer: Self-pay

## 2019-06-14 ENCOUNTER — Telehealth: Payer: Self-pay

## 2019-06-14 ENCOUNTER — Other Ambulatory Visit: Payer: Self-pay | Admitting: Nurse Practitioner

## 2019-06-14 ENCOUNTER — Ambulatory Visit: Payer: Medicare HMO | Admitting: Internal Medicine

## 2019-06-14 DIAGNOSIS — F5101 Primary insomnia: Secondary | ICD-10-CM

## 2019-06-14 DIAGNOSIS — G4710 Hypersomnia, unspecified: Secondary | ICD-10-CM

## 2019-06-14 MED ORDER — ZOLPIDEM TARTRATE 10 MG PO TABS: ORAL_TABLET | ORAL | 0 refills | Status: DC

## 2019-06-14 NOTE — Progress Notes (Signed)
Increased dose to 10mg  and sent #10 to her pharmacy.

## 2019-06-15 ENCOUNTER — Encounter (INDEPENDENT_AMBULATORY_CARE_PROVIDER_SITE_OTHER): Payer: Medicare HMO | Admitting: Internal Medicine

## 2019-06-15 DIAGNOSIS — G4719 Other hypersomnia: Secondary | ICD-10-CM

## 2019-06-15 DIAGNOSIS — G4710 Hypersomnia, unspecified: Secondary | ICD-10-CM

## 2019-06-15 NOTE — Telephone Encounter (Signed)
Error

## 2019-06-17 DIAGNOSIS — M9902 Segmental and somatic dysfunction of thoracic region: Secondary | ICD-10-CM | POA: Diagnosis not present

## 2019-06-17 DIAGNOSIS — M9901 Segmental and somatic dysfunction of cervical region: Secondary | ICD-10-CM | POA: Diagnosis not present

## 2019-06-17 DIAGNOSIS — M530 Cervicocranial syndrome: Secondary | ICD-10-CM | POA: Diagnosis not present

## 2019-06-17 DIAGNOSIS — M9903 Segmental and somatic dysfunction of lumbar region: Secondary | ICD-10-CM | POA: Diagnosis not present

## 2019-06-17 DIAGNOSIS — M5136 Other intervertebral disc degeneration, lumbar region: Secondary | ICD-10-CM | POA: Diagnosis not present

## 2019-06-17 DIAGNOSIS — M546 Pain in thoracic spine: Secondary | ICD-10-CM | POA: Diagnosis not present

## 2019-06-21 NOTE — Procedures (Signed)
[facility] [place], [place] [address]  Sleep Specialist: Allyne Gee, MD [facility] Sleep Study Interpretation  Patient Name: Danielle Nash Patient MR [id number] DOB:1951/03/18  Date of Study: June 15, 2019  Indications for study: Hypersomnia excessive daytime somnolence  BMI: 29.6 kg/m       Respiratory Data:  Total AHI: 0.5/h  Total Obstructive Apneas: 0  Total Central Apneas: 0  Total Mixed Apneas: 0  Total Hypopneas: 4  If the AHI is greater than 5 per hour patient qualifies for PAP evaluation  Oximetry Data:  Oxygen Desaturation Index: 1.9/h  Lowest Desaturation: 85%  Cardiac Data:  Minimum Heart Rate: 40  Maximum Heart Rate: 150   Impression / Diagnosis:  This apnea study does not show significant obstructive sleep apnea with an AHI of 0.5/h.  Patient did have oxygen desaturation down to 85% and clinical correlation would be recommended.  Also noted is a tachycardia bradycardia periodically and search for underlying cardiopulmonary issues should be concerned.  GENERAL Recommendations:  1.  Consider Auto PAP with pressure ranges 5-20 cmH20 with download, or facility based PAP Titration Study  2.  Consider PAP interface mask fitted for patient comfort, Heated Humidification & PAP compliance monitoring (1 month, 3 months & 12 months after PAP initiation)  3. Consider treatment with mandibular advancement splint (MAS) or referral to an ENT surgeon for modification to the upper airway if the patient prefers an alternate therapy or the PAP trial is unsuccessful  4. Sleep hygiene measures should be discussed with the patient  5. Behavioral therapy such as weight reduction or smoking cessation as appropriate for the patient  6. Advise patient against the use of alcohol or sedatives in so much as these substances can worsen excessive daytime sleepiness and respiratory disturbances of sleep  7. Advise patient against  participating in potentially dangerous activities while drowsy such as operating a motor vehicle, heavy equipment or power tools as it can put them and others in danger  8. Advise patient of the long term consequences of OSA if left untreated, need for treatment and close follow up  9. Clinical follow up as deemed necessary     This Level III home sleep study was performed using the [employer], a 4 channel screening device subject to limitations. Depending on actual total sleep time, not measured in this study, the AHI (sum of apneas and hypopneas/hr of sleep) and therefore the severity of sleep apnea may be underestimated. As with any single night study, including Level 1 attended PSG, severity of sleep apnea may also be underestimated due to the lack of supine and/or REM sleep.  The interpretation associated with this report is based on normal values and degrees of severity in accordance with AASM parameters and/or estimated from multiple sources in the literature for adults ages 75-80+. These may not agree with the displayed values. The patient's treating physician should use the interpretation and recommendations in conjunction with the overall clinical evaluation and treatment of the patient.  Some of the terminology used in this scored [facility] report was developed several years ago and may not always be in accordance with current nomenclature. This in no way affects the accuracy of the data or the reliability of the interpretation and recommendations.

## 2019-06-22 ENCOUNTER — Encounter: Payer: Self-pay | Admitting: Internal Medicine

## 2019-06-22 ENCOUNTER — Ambulatory Visit: Payer: Medicare HMO | Admitting: Internal Medicine

## 2019-06-22 ENCOUNTER — Other Ambulatory Visit: Payer: Self-pay

## 2019-06-22 VITALS — BP 135/81 | HR 78 | Resp 16 | Ht 60.0 in | Wt 146.0 lb

## 2019-06-22 DIAGNOSIS — F5101 Primary insomnia: Secondary | ICD-10-CM

## 2019-06-22 DIAGNOSIS — G4734 Idiopathic sleep related nonobstructive alveolar hypoventilation: Secondary | ICD-10-CM

## 2019-06-22 DIAGNOSIS — R69 Illness, unspecified: Secondary | ICD-10-CM | POA: Diagnosis not present

## 2019-06-22 NOTE — Progress Notes (Signed)
[facility] [place], [place] [address]  Pulmonary Sleep Medicine   Office Visit Note  Patient Name: Danielle Nash DOB: 1951/09/26 MRN [id number]  Date of Service: 06/22/2019  Complaints/HPI: Pt is here for follow up on sleep study. Her AHI was 0.5/hr.  She did have a significant oxygen desaturation noted with a low saturation of 82%.  She will likely need an overnight oximetry to evaluate her for nocturnal hypoxia. She continues to report difficulty sleeping at night, despite multiple medications.   ROS  General: (-) fever, (-) chills, (-) night sweats, (-) weakness Skin: (-) rashes, (-) itching,. Eyes: (-) visual changes, (-) redness, (-) itching. Nose and Sinuses: (-) nasal stuffiness or itchiness, (-) postnasal drip, (-) nosebleeds, (-) sinus trouble. Mouth and Throat: (-) sore throat, (-) hoarseness. Neck: (-) swollen glands, (-) enlarged thyroid, (-) neck pain. Respiratory: - cough, (-) bloody sputum, - shortness of breath, - wheezing. Cardiovascular: - ankle swelling, (-) chest pain. Lymphatic: (-) lymph node enlargement. Neurologic: (-) numbness, (-) tingling. Psychiatric: (-) anxiety, (-) depression   Current Medication: Outpatient Encounter Medications as of 06/22/2019  Medication Sig  . B Complex Vitamins (VITAMIN-B COMPLEX) TABS Take 1 tablet by mouth 1 day or 1 dose.  . butalbital-acetaminophen-caffeine (FIORICET, ESGIC) 50-325-40 MG tablet Take 2 tablets by mouth 2 (two) times daily as needed for migraine.   . Calcium Carb-Cholecalciferol (CALCIUM-VITAMIN D) 500-200 MG-UNIT tablet Take 1 tablet by mouth 1 day or 1 dose.  . Cholecalciferol (VITAMIN D) 2000 units tablet Take 4,000 Units by mouth 1 day or 1 dose.  . folic acid (FOLVITE) 295 MCG tablet Take 1 tablet by mouth 1 day or 1 dose.  Marland Kitchen MAGNESIUM PO Take 1 tablet by mouth 1 day or 1 dose.  . Multiple Vitamin (MULTI-VITAMINS) TABS Take 1 tablet by mouth 1 day or 1 dose.  . Omega-3 Fatty  Acids (FISH OIL PO) Take 1 capsule by mouth 1 day or 1 dose.  . phentermine (ADIPEX-P) 37.5 MG tablet Take 1 tablet (37.5 mg total) by mouth daily before breakfast.  . Probiotic Product (PROBIOTIC & ACIDOPHILUS EX ST) CAPS Take 1 tablet by mouth 1 day or 1 dose.  Marland Kitchen tiZANidine (ZANAFLEX) 2 MG tablet Take 2 mg by mouth every 6 (six) hours as needed for muscle spasms.  . vitamin B-12 (CYANOCOBALAMIN) 1000 MCG tablet Take 1 tablet by mouth 1 day or 1 dose.  . vitamin E 400 UNIT capsule Take 1 capsule by mouth 1 day or 1 dose.  . zolpidem (AMBIEN) 10 MG tablet Take 1/2 to 1 tablet po QHS prn insomnia   No facility-administered encounter medications on file as of 06/22/2019.     Surgical History: Past Surgical History:  Procedure Laterality Date  . INCISIONAL HERNIA REPAIR N/A 09/05/2017   Procedure: HERNIA REPAIR INCISIONAL;  Surgeon: Vickie Epley, MD;  Location: [facility] ORS;  Service: General;  Laterality: N/A;  . LAPAROSCOPIC APPENDECTOMY N/A 10/19/2016   Procedure: APPENDECTOMY LAPAROSCOPIC;  Surgeon: Dia Crawford III, MD;  Location: [facility] ORS;  Service: General;  Laterality: N/A;  . MEDIAL PARTIAL KNEE REPLACEMENT Bilateral   . THYROID SURGERY      Medical History: Past Medical History:  Diagnosis Date  . Allergy   . Allergy-induced asthma   . Frequent headaches   . Irritable bowel syndrome (IBS)   . Thyroid disease     Family History: Family History  Problem Relation Age of Onset  . Arthritis Mother   .  Diabetes Mother   . Heart disease Father   . Breast cancer Sister   . Diabetes Maternal Uncle     Social History: Social History   Socioeconomic History  . Marital status: Married    Spouse name: Not on file  . Number of children: Not on file  . Years of education: Not on file  . Highest education level: Not on file  Occupational History  . Not on file  Social Needs  . Financial resource strain: Not on file  . Food insecurity    Worry: Not on file    Inability: Not  on file  . Transportation needs    Medical: Not on file    Non-medical: Not on file  Tobacco Use  . Smoking status: Never Smoker  . Smokeless tobacco: Never Used  Substance and Sexual Activity  . Alcohol use: No    Alcohol/week: 0.0 standard drinks  . Drug use: No  . Sexual activity: Never  Lifestyle  . Physical activity    Days per week: Not on file    Minutes per session: Not on file  . Stress: Not on file  Relationships  . Social Herbalist on phone: Not on file    Gets together: Not on file    Attends religious service: Not on file    Active member of club or organization: Not on file    Attends meetings of clubs or organizations: Not on file    Relationship status: Not on file  . Intimate partner violence    Fear of current or ex partner: Not on file    Emotionally abused: Not on file    Physically abused: Not on file    Forced sexual activity: Not on file  Other Topics Concern  . Not on file  Social History Narrative  . Not on file    Vital Signs: Blood pressure 135/81, pulse 78, resp. rate 16, height 5' (1.524 m), weight 146 lb (66.2 kg), SpO2 98 %.  Examination: General Appearance: The patient is well-developed, well-nourished, and in no distress. Skin: Gross inspection of skin unremarkable. Head: normocephalic, no gross deformities. Eyes: no gross deformities noted. ENT: ears appear grossly normal no exudates. Neck: Supple. No thyromegaly. No LAD. Respiratory: clear bilaterally. Cardiovascular: Normal S1 and S2 without murmur or rub. Extremities: No cyanosis. pulses are equal. Neurologic: Alert and oriented. No involuntary movements.  LABS: No results found for this or any previous visit (from the past 2160 hour(s)).  Radiology: Ct Abdomen Pelvis W Contrast  Result Date: 09/05/2017 CLINICAL DATA:  Nausea and vomiting since 8 p.m. Generalized abdominal pain. History of irritable bowel syndrome and appendectomy. EXAM: CT ABDOMEN AND PELVIS  WITH CONTRAST TECHNIQUE: Multidetector CT imaging of the abdomen and pelvis was performed using the standard protocol following bolus administration of intravenous contrast. CONTRAST:  160mL ISOVUE-300 IOPAMIDOL (ISOVUE-300) INJECTION 61% COMPARISON:  10/18/2016 FINDINGS: Lower chest: Bilateral lower lung opacities likely representing atelectasis. Hepatobiliary: No focal liver abnormality is seen. No gallstones, gallbladder wall thickening, or biliary dilatation. Pancreas: Unremarkable. No pancreatic ductal dilatation or surrounding inflammatory changes. Spleen: Normal in size without focal abnormality. Adrenals/Urinary Tract: No adrenal gland nodules. Renal nephrograms are homogeneous and symmetrical. Subcentimeter cysts in both kidneys. 2 mm stone in the midportion left kidney. No ureteral or bladder stones. Bladder wall is not thickened. Stomach/Bowel: There is a midline ventral abdominal wall hernia in the low pelvic region containing small bowel. There is evidence of small bowel obstruction  caused by the hernia. Proximal small bowel are mildly dilated with small bowel feces sign and air-fluid levels. There is mild of wall thickening of the small bowel near the hernia with some infiltration in the adjacent fat suggesting fat necrosis and possible strangulation. Colon is decompressed with scattered stool. Appendix is surgically absent. Vascular/Lymphatic: Aortic atherosclerosis. No enlarged abdominal or pelvic lymph nodes. Reproductive: Uterus and bilateral adnexa are unremarkable. Other: No free air or free fluid in the abdomen. Musculoskeletal: Degenerative changes in the spine. No destructive bone lesions. IMPRESSION: 1. Midline ventral abdominal wall hernia in the low pelvis containing small bowel with proximal obstruction. There is evidence of strangulation with bowel wall thickening and infiltration in the herniated fat. 2. Nonobstructing stone in the left kidney. 3. Aortic atherosclerosis. Electronically  Signed   By: Lucienne Capers M.D.   On: 09/05/2017 04:18    No results found.  No results found.    Assessment and Plan: Patient Active Problem List   Diagnosis Date Noted  . Primary insomnia 05/31/2019  . Chronic right shoulder pain 04/14/2019  . Excessive daytime sleepiness 04/14/2019  . Hypercalcemia 03/18/2019  . Primary hyperparathyroidism ([place]) 03/18/2019  . Iron deficiency anemia 03/18/2019  . Body mass index 29.0-29.9, adult 03/18/2019  . Status post surgery 09/08/2017  . Incisional hernia of anterior abdominal wall with obstruction 09/05/2017  . Abdominal pain 10/18/2016  . Thyroid disease 06/12/2016  . Allergy-induced asthma 06/12/2016  . Frequent headaches 06/12/2016  . Acute appendicitis 03/11/2016  . Insomnia, persistent 10/02/2015  . Chronic insomnia 10/02/2015  . Degeneration of intervertebral disc of lumbar region 05/19/2015  . Lumbar radiculitis 05/19/2015    1. Primary insomnia Discussed sleep hygiene, limiting caffeine and other ways to help with sleep.  Continue taking medications as discussed.    2. Nocturnal oxygen desaturation Will get overnight oximetry, to evaluate for nocturnal hypoxia.  - Pulse oximetry, overnight; Future  General Counseling: I have discussed the findings of the evaluation and examination with Lyndee Leo.  I have also discussed any further diagnostic evaluation thatmay be needed or ordered today. Samina verbalizes understanding of the findings of todays visit. We also reviewed her medications today and discussed drug interactions and side effects including but not limited excessive drowsiness and altered mental states. We also discussed that there is always a risk not just to her but also people around her. she has been encouraged to call the office with any questions or concerns that should arise related to todays visit.    Time spent: 20 This patient was seen by Orson Gear AGNP-C in Collaboration with Dr. Devona Konig as a part  of collaborative care agreement.   I have personally obtained a history, examined the patient, evaluated laboratory and imaging results, formulated the assessment and plan and placed orders.    Allyne Gee, MD [facility] Pulmonary and Critical Care Sleep medicine

## 2019-06-22 NOTE — Patient Instructions (Signed)

## 2019-06-24 ENCOUNTER — Telehealth: Payer: Self-pay

## 2019-06-24 NOTE — Telephone Encounter (Signed)
Gave [employer] orders for Overnight Oximetry test. Put copy in scan. Beth

## 2019-06-28 ENCOUNTER — Other Ambulatory Visit: Payer: Self-pay

## 2019-06-28 ENCOUNTER — Encounter: Payer: Self-pay | Admitting: Nurse Practitioner

## 2019-06-28 ENCOUNTER — Ambulatory Visit (INDEPENDENT_AMBULATORY_CARE_PROVIDER_SITE_OTHER): Payer: Medicare HMO | Admitting: Nurse Practitioner

## 2019-06-28 VITALS — BP 146/83 | HR 60 | Resp 16 | Ht 60.0 in | Wt 146.8 lb

## 2019-06-28 DIAGNOSIS — K580 Irritable bowel syndrome with diarrhea: Secondary | ICD-10-CM | POA: Insufficient documentation

## 2019-06-28 DIAGNOSIS — G4719 Other hypersomnia: Secondary | ICD-10-CM | POA: Diagnosis not present

## 2019-06-28 DIAGNOSIS — F5101 Primary insomnia: Secondary | ICD-10-CM

## 2019-06-28 DIAGNOSIS — R69 Illness, unspecified: Secondary | ICD-10-CM | POA: Diagnosis not present

## 2019-06-28 MED ORDER — DICYCLOMINE HCL 10 MG PO CAPS: 10.0000 mg | ORAL_CAPSULE | Freq: Three times a day (TID) | ORAL | 1 refills | Status: DC

## 2019-06-28 NOTE — Progress Notes (Signed)
[facility] [place], [place] [address]  Internal MEDICINE  Office Visit Note  Patient Name: Danielle Nash  [id number]  [id number]  Date of Service: 06/28/2019  Chief Complaint  Patient presents with  . Follow-up    weight management  . Medication Management    ambien, phentermine    The patient is here for follow up of weight management. States that she has stopped taking phentermine. Was having trouble sleeping at all. States that she has actually lost more weight since she stopped taking phentermine. She did have home sleep study. Has low oxygen levels at night. Will have overnight pulse oximetry study in the upcoming weeks for further evaluation. She states that she has had several loose bowel movements. No belly pain. Feels her belly start to roll and has to use the bathroom really quickly. After bowel movement, belly feels better. She is doing better with ambien 10mg  at night.       Current Medication: Outpatient Encounter Medications as of 06/28/2019  Medication Sig  . B Complex Vitamins (VITAMIN-B COMPLEX) TABS Take 1 tablet by mouth 1 day or 1 dose.  . butalbital-acetaminophen-caffeine (FIORICET, ESGIC) 50-325-40 MG tablet Take 2 tablets by mouth 2 (two) times daily as needed for migraine.   . Calcium Carb-Cholecalciferol (CALCIUM-VITAMIN D) 500-200 MG-UNIT tablet Take 1 tablet by mouth 1 day or 1 dose.  . Cholecalciferol (VITAMIN D) 2000 units tablet Take 4,000 Units by mouth 1 day or 1 dose.  . folic acid (FOLVITE) 702 MCG tablet Take 1 tablet by mouth 1 day or 1 dose.  Marland Kitchen MAGNESIUM PO Take 1 tablet by mouth 1 day or 1 dose.  . Multiple Vitamin (MULTI-VITAMINS) TABS Take 1 tablet by mouth 1 day or 1 dose.  . Omega-3 Fatty Acids (FISH OIL PO) Take 1 capsule by mouth 1 day or 1 dose.  . Probiotic Product (PROBIOTIC & ACIDOPHILUS EX ST) CAPS Take 1 tablet by mouth 1 day or 1 dose.  Marland Kitchen tiZANidine (ZANAFLEX) 2 MG tablet Take 2 mg by mouth every 6 (six)  hours as needed for muscle spasms.  . vitamin B-12 (CYANOCOBALAMIN) 1000 MCG tablet Take 1 tablet by mouth 1 day or 1 dose.  . vitamin E 400 UNIT capsule Take 1 capsule by mouth 1 day or 1 dose.  . zolpidem (AMBIEN) 10 MG tablet Take 1/2 to 1 tablet po QHS prn insomnia  . dicyclomine (BENTYL) 10 MG capsule Take 1 capsule (10 mg total) by mouth 4 (four) times daily -  before meals and at bedtime.  . phentermine (ADIPEX-P) 37.5 MG tablet Take 1 tablet (37.5 mg total) by mouth daily before breakfast. (Patient not taking: Reported on 06/28/2019)   No facility-administered encounter medications on file as of 06/28/2019.     Surgical History: Past Surgical History:  Procedure Laterality Date  . INCISIONAL HERNIA REPAIR N/A 09/05/2017   Procedure: HERNIA REPAIR INCISIONAL;  Surgeon: Vickie Epley, MD;  Location: [facility] ORS;  Service: General;  Laterality: N/A;  . LAPAROSCOPIC APPENDECTOMY N/A 10/19/2016   Procedure: APPENDECTOMY LAPAROSCOPIC;  Surgeon: Dia Crawford III, MD;  Location: [facility] ORS;  Service: General;  Laterality: N/A;  . MEDIAL PARTIAL KNEE REPLACEMENT Bilateral   . THYROID SURGERY      Medical History: Past Medical History:  Diagnosis Date  . Allergy   . Allergy-induced asthma   . Frequent headaches   . Irritable bowel syndrome (IBS)   . Thyroid disease     Family  History: Family History  Problem Relation Age of Onset  . Arthritis Mother   . Diabetes Mother   . Heart disease Father   . Breast cancer Sister   . Diabetes Maternal Uncle     Social History   Socioeconomic History  . Marital status: Married    Spouse name: Not on file  . Number of children: Not on file  . Years of education: Not on file  . Highest education level: Not on file  Occupational History  . Not on file  Social Needs  . Financial resource strain: Not on file  . Food insecurity    Worry: Not on file    Inability: Not on file  . Transportation needs    Medical: Not on file    Non-medical:  Not on file  Tobacco Use  . Smoking status: Never Smoker  . Smokeless tobacco: Never Used  Substance and Sexual Activity  . Alcohol use: No    Alcohol/week: 0.0 standard drinks  . Drug use: No  . Sexual activity: Never  Lifestyle  . Physical activity    Days per week: Not on file    Minutes per session: Not on file  . Stress: Not on file  Relationships  . Social Herbalist on phone: Not on file    Gets together: Not on file    Attends religious service: Not on file    Active member of club or organization: Not on file    Attends meetings of clubs or organizations: Not on file    Relationship status: Not on file  . Intimate partner violence    Fear of current or ex partner: Not on file    Emotionally abused: Not on file    Physically abused: Not on file    Forced sexual activity: Not on file  Other Topics Concern  . Not on file  Social History Narrative  . Not on file      Review of Systems  Constitutional: Negative for chills, fatigue and unexpected weight change.       Six pound weight loss since last visit.   HENT: Negative for congestion, postnasal drip, rhinorrhea, sneezing, sore throat and voice change.   Respiratory: Negative for cough, chest tightness, shortness of breath and wheezing.   Cardiovascular: Negative for chest pain and palpitations.  Gastrointestinal: Negative for abdominal pain, constipation, diarrhea, nausea and vomiting.  Endocrine: Negative for cold intolerance, heat intolerance, polydipsia and polyuria.       Recent labs showing normal parathyroid function with stable calcium levels.   Musculoskeletal: Positive for arthralgias, joint swelling and myalgias. Negative for back pain and neck pain.       Worsening right shoulder pain. Now starting to get worse in left shoulder pain.   Skin: Negative for rash.  Allergic/Immunologic: Negative for environmental allergies.  Neurological: Negative for dizziness, tremors, numbness and headaches.   Hematological: Negative for adenopathy. Does not bruise/bleed easily.  Psychiatric/Behavioral: Positive for sleep disturbance. Negative for behavioral problems (Depression) and suicidal ideas. The patient is not nervous/anxious.     Today's Vitals   06/28/19 1503  BP: (!) 146/83  Pulse: 60  Resp: 16  SpO2: 99%  Weight: 146 lb 12.8 oz (66.6 kg)  Height: 5' (1.524 m)   Body mass index is 28.67 kg/m.  Physical Exam Vitals signs and nursing note reviewed.  Constitutional:      General: She is not in acute distress.    Appearance: Normal appearance.  She is well-developed. She is not diaphoretic.  HENT:     Head: Normocephalic and atraumatic.     Mouth/Throat:     Mouth: Mucous membranes are moist.     Pharynx: Oropharynx is clear. No oropharyngeal exudate.  Eyes:     Extraocular Movements: Extraocular movements intact.     Pupils: Pupils are equal, round, and reactive to light.  Neck:     Musculoskeletal: Normal range of motion and neck supple.     Thyroid: No thyromegaly.     Vascular: No JVD.     Trachea: No tracheal deviation.  Cardiovascular:     Rate and Rhythm: Normal rate and regular rhythm.     Heart sounds: Normal heart sounds. No murmur. No friction rub. No gallop.   Pulmonary:     Effort: Pulmonary effort is normal. No respiratory distress.     Breath sounds: Normal breath sounds. No wheezing or rales.  Chest:     Chest wall: No tenderness.  Abdominal:     General: Bowel sounds are normal.     Palpations: Abdomen is soft.     Tenderness: There is no abdominal tenderness.  Musculoskeletal: Normal range of motion.        General: No tenderness.  Lymphadenopathy:     Cervical: No cervical adenopathy.  Skin:    General: Skin is warm and dry.  Neurological:     Mental Status: She is alert and oriented to person, place, and time.     Cranial Nerves: No cranial nerve deficit.  Psychiatric:        Behavior: Behavior normal.        Thought Content: Thought  content normal.        Judgment: Judgment normal.   Assessment/Plan: 1. Irritable bowel syndrome with diarrhea Recommend increased fiber in the diet to increase bulkiness of stool. Added dicyclomine three times daily as needed for IBS with diarrhea.  - dicyclomine (BENTYL) 10 MG capsule; Take 1 capsule (10 mg total) by mouth 4 (four) times daily -  before meals and at bedtime.  Dispense: 90 capsule; Refill: 1  2. Primary insomnia Improved. May take ambien and/or OTC sleep aid as needed and as indicated.   3. Excessive daytime sleepiness Follow up with sleep medicine as scheduled.   General Counseling: ledora delker understanding of the findings of todays visit and agrees with plan of treatment. I have discussed any further diagnostic evaluation that may be needed or ordered today. We also reviewed her medications today. she has been encouraged to call the office with any questions or concerns that should arise related to todays visit.   A high fiber diet with plenty of fluids (up to 8 glasses of water daily) is suggested to relieve these symptoms.  Metamucil, 1 tablespoon once or twice daily can be used to keep bowels regular if needed.  This patient was seen by [facility] [place] with Dr Lavera Guise as a part of collaborative care agreement  Meds ordered this encounter  Medications  . dicyclomine (BENTYL) 10 MG capsule    Sig: Take 1 capsule (10 mg total) by mouth 4 (four) times daily -  before meals and at bedtime.    Dispense:  90 capsule    Refill:  1    Order Specific Question:   Supervising Provider    Answer:   Lavera Guise [[id number]]    Time spent: 25 Minutes      Dr Lavera Guise Internal  medicine

## 2019-07-04 NOTE — Procedures (Signed)
[facility] [place], [place] [address]  Sleep Specialist: Allyne Gee, MD [facility] Sleep Study Interpretation  Patient Name: Danielle Nash Patient MR [id number] DOB:[birth year]  Date of Study: June 15, 2019  Indications for study: Hypersomnia excessive daytime somnolence  BMI: 29.6 kg/m       Respiratory Data:  Total AHI: 0.5/h  Total Obstructive Apneas: 0  Total Central Apneas: 0  Total Mixed Apneas: 0  Total Hypopneas: 4  If the AHI is greater than 5 per hour patient qualifies for PAP evaluation  Oximetry Data:  Oxygen Desaturation Index: 1.9/h  Lowest Desaturation: 82%  Cardiac Data:  Minimum Heart Rate: 40  Maximum Heart Rate: 150   Impression / Diagnosis:  This apnea study is within normal limits with an AHI of 0.5/h.  Patient does have some oxygen desaturations noted with a low saturation of 82%.  In addition patient does have some cardiac dysrhythmias with bradycardia as well as tachycardia.  Clinical correlation is recommended further cardiac work-up may be indicated.  GENERAL Recommendations:  1.  Consider Auto PAP with pressure ranges 5-20 cmH20 with download, or facility based PAP Titration Study  2.  Consider PAP interface mask fitted for patient comfort, Heated Humidification & PAP compliance monitoring (1 month, 3 months & 12 months after PAP initiation)  3. Consider treatment with mandibular advancement splint (MAS) or referral to an ENT surgeon for modification to the upper airway if the patient prefers an alternate therapy or the PAP trial is unsuccessful  4. Sleep hygiene measures should be discussed with the patient  5. Behavioral therapy such as weight reduction or smoking cessation as appropriate for the patient  6. Advise patient against the use of alcohol or sedatives in so much as these substances can worsen excessive daytime sleepiness and respiratory disturbances of sleep  7. Advise  patient against participating in potentially dangerous activities while drowsy such as operating a motor vehicle, heavy equipment or power tools as it can put them and others in danger  8. Advise patient of the long term consequences of OSA if left untreated, need for treatment and close follow up  9. Clinical follow up as deemed necessary     This Level III home sleep study was performed using the [employer], a 4 channel screening device subject to limitations. Depending on actual total sleep time, not measured in this study, the AHI (sum of apneas and hypopneas/hr of sleep) and therefore the severity of sleep apnea may be underestimated. As with any single night study, including Level 1 attended PSG, severity of sleep apnea may also be underestimated due to the lack of supine and/or REM sleep.  The interpretation associated with this report is based on normal values and degrees of severity in accordance with AASM parameters and/or estimated from multiple sources in the literature for adults ages 28-80+. These may not agree with the displayed values. The patient's treating physician should use the interpretation and recommendations in conjunction with the overall clinical evaluation and treatment of the patient.  Some of the terminology used in this scored [facility] report was developed several years ago and may not always be in accordance with current nomenclature. This in no way affects the accuracy of the data or the reliability of the interpretation and recommendations.

## 2019-07-08 DIAGNOSIS — R0602 Shortness of breath: Secondary | ICD-10-CM | POA: Diagnosis not present

## 2019-07-12 NOTE — Progress Notes (Signed)
She has home sleep study follow up 07/15/2019

## 2019-07-14 DIAGNOSIS — J4520 Mild intermittent asthma, uncomplicated: Secondary | ICD-10-CM | POA: Diagnosis not present

## 2019-07-15 ENCOUNTER — Encounter: Payer: Self-pay | Admitting: Internal Medicine

## 2019-07-15 ENCOUNTER — Ambulatory Visit: Payer: Medicare HMO | Admitting: Internal Medicine

## 2019-07-15 ENCOUNTER — Other Ambulatory Visit: Payer: Self-pay

## 2019-07-15 VITALS — BP 142/78 | HR 65 | Resp 16 | Ht 60.0 in | Wt 145.0 lb

## 2019-07-15 DIAGNOSIS — G4719 Other hypersomnia: Secondary | ICD-10-CM

## 2019-07-15 DIAGNOSIS — F5101 Primary insomnia: Secondary | ICD-10-CM

## 2019-07-15 DIAGNOSIS — R69 Illness, unspecified: Secondary | ICD-10-CM | POA: Diagnosis not present

## 2019-07-15 DIAGNOSIS — G4734 Idiopathic sleep related nonobstructive alveolar hypoventilation: Secondary | ICD-10-CM | POA: Diagnosis not present

## 2019-07-15 NOTE — Progress Notes (Signed)
[facility] [place], [place] [address]  Pulmonary Sleep Medicine   Office Visit Note  Patient Name: Danielle Nash DOB: 1951-08-05 MRN [id number]  Date of Service: 07/15/2019  Complaints/HPI: Pt is here for follow up.  She reports her oxygen was delivered yesterday and she slept with it for the first time last night  She reports not noticing a difference. She continues to have issues with sleep.  She states she only sleeps for a few hours.    ROS  General: (-) fever, (-) chills, (-) night sweats, (-) weakness Skin: (-) rashes, (-) itching,. Eyes: (-) visual changes, (-) redness, (-) itching. Nose and Sinuses: (-) nasal stuffiness or itchiness, (-) postnasal drip, (-) nosebleeds, (-) sinus trouble. Mouth and Throat: (-) sore throat, (-) hoarseness. Neck: (-) swollen glands, (-) enlarged thyroid, (-) neck pain. Respiratory: - cough, (-) bloody sputum, - shortness of breath, - wheezing. Cardiovascular: - ankle swelling, (-) chest pain. Lymphatic: (-) lymph node enlargement. Neurologic: (-) numbness, (-) tingling. Psychiatric: (-) anxiety, (-) depression   Current Medication: Outpatient Encounter Medications as of 07/15/2019  Medication Sig  . B Complex Vitamins (VITAMIN-B COMPLEX) TABS Take 1 tablet by mouth 1 day or 1 dose.  . butalbital-acetaminophen-caffeine (FIORICET, ESGIC) 50-325-40 MG tablet Take 2 tablets by mouth 2 (two) times daily as needed for migraine.   . Calcium Carb-Cholecalciferol (CALCIUM-VITAMIN D) 500-200 MG-UNIT tablet Take 1 tablet by mouth 1 day or 1 dose.  . Cholecalciferol (VITAMIN D) 2000 units tablet Take 4,000 Units by mouth 1 day or 1 dose.  . dicyclomine (BENTYL) 10 MG capsule Take 1 capsule (10 mg total) by mouth 4 (four) times daily -  before meals and at bedtime.  . folic acid (FOLVITE) 633 MCG tablet Take 1 tablet by mouth 1 day or 1 dose.  Marland Kitchen MAGNESIUM PO Take 1 tablet by mouth 1 day or 1 dose.  . Multiple Vitamin  (MULTI-VITAMINS) TABS Take 1 tablet by mouth 1 day or 1 dose.  . Omega-3 Fatty Acids (FISH OIL PO) Take 1 capsule by mouth 1 day or 1 dose.  . OXYGEN Inhale 2 L into the lungs. At night time  . phentermine (ADIPEX-P) 37.5 MG tablet Take 1 tablet (37.5 mg total) by mouth daily before breakfast.  . Probiotic Product (PROBIOTIC & ACIDOPHILUS EX ST) CAPS Take 1 tablet by mouth 1 day or 1 dose.  Marland Kitchen tiZANidine (ZANAFLEX) 2 MG tablet Take 2 mg by mouth every 6 (six) hours as needed for muscle spasms.  . vitamin B-12 (CYANOCOBALAMIN) 1000 MCG tablet Take 1 tablet by mouth 1 day or 1 dose.  . vitamin E 400 UNIT capsule Take 1 capsule by mouth 1 day or 1 dose.  . zolpidem (AMBIEN) 10 MG tablet Take 1/2 to 1 tablet po QHS prn insomnia   No facility-administered encounter medications on file as of 07/15/2019.     Surgical History: Past Surgical History:  Procedure Laterality Date  . INCISIONAL HERNIA REPAIR N/A 09/05/2017   Procedure: HERNIA REPAIR INCISIONAL;  Surgeon: Danielle Epley, MD;  Location: [facility] ORS;  Service: General;  Laterality: N/A;  . LAPAROSCOPIC APPENDECTOMY N/A 10/19/2016   Procedure: APPENDECTOMY LAPAROSCOPIC;  Surgeon: Dia Crawford III, MD;  Location: [facility] ORS;  Service: General;  Laterality: N/A;  . MEDIAL PARTIAL KNEE REPLACEMENT Bilateral   . THYROID SURGERY      Medical History: Past Medical History:  Diagnosis Date  . Allergy   . Allergy-induced asthma   .  Frequent headaches   . Irritable bowel syndrome (IBS)   . Thyroid disease     Family History: Family History  Problem Relation Age of Onset  . Arthritis Mother   . Diabetes Mother   . Heart disease Father   . Breast cancer Sister   . Diabetes Maternal Uncle     Social History: Social History   Socioeconomic History  . Marital status: Married    Spouse name: Not on file  . Number of children: Not on file  . Years of education: Not on file  . Highest education level: Not on file  Occupational History  .  Not on file  Social Needs  . Financial resource strain: Not on file  . Food insecurity    Worry: Not on file    Inability: Not on file  . Transportation needs    Medical: Not on file    Non-medical: Not on file  Tobacco Use  . Smoking status: Never Smoker  . Smokeless tobacco: Never Used  Substance and Sexual Activity  . Alcohol use: No    Alcohol/week: 0.0 standard drinks  . Drug use: No  . Sexual activity: Never  Lifestyle  . Physical activity    Days per week: Not on file    Minutes per session: Not on file  . Stress: Not on file  Relationships  . Social Herbalist on phone: Not on file    Gets together: Not on file    Attends religious service: Not on file    Active member of club or organization: Not on file    Attends meetings of clubs or organizations: Not on file    Relationship status: Not on file  . Intimate partner violence    Fear of current or ex partner: Not on file    Emotionally abused: Not on file    Physically abused: Not on file    Forced sexual activity: Not on file  Other Topics Concern  . Not on file  Social History Narrative  . Not on file    Vital Signs: Blood pressure (!) 142/78, pulse 65, resp. rate 16, height 5' (1.524 m), weight 145 lb (65.8 kg), SpO2 97 %.  Examination: General Appearance: The patient is well-developed, well-nourished, and in no distress. Skin: Gross inspection of skin unremarkable. Head: normocephalic, no gross deformities. Eyes: no gross deformities noted. ENT: ears appear grossly normal no exudates. Neck: Supple. No thyromegaly. No LAD. Respiratory: clear bilaterally. Cardiovascular: Normal S1 and S2 without murmur or rub. Extremities: No cyanosis. pulses are equal. Neurologic: Alert and oriented. No involuntary movements.  LABS: No results found for this or any previous visit (from the past 2160 hour(s)).  Radiology: Ct Abdomen Pelvis W Contrast  Result Date: 09/05/2017 CLINICAL DATA:  Nausea and  vomiting since 8 p.m. Generalized abdominal pain. History of irritable bowel syndrome and appendectomy. EXAM: CT ABDOMEN AND PELVIS WITH CONTRAST TECHNIQUE: Multidetector CT imaging of the abdomen and pelvis was performed using the standard protocol following bolus administration of intravenous contrast. CONTRAST:  12mL ISOVUE-300 IOPAMIDOL (ISOVUE-300) INJECTION 61% COMPARISON:  10/18/2016 FINDINGS: Lower chest: Bilateral lower lung opacities likely representing atelectasis. Hepatobiliary: No focal liver abnormality is seen. No gallstones, gallbladder wall thickening, or biliary dilatation. Pancreas: Unremarkable. No pancreatic ductal dilatation or surrounding inflammatory changes. Spleen: Normal in size without focal abnormality. Adrenals/Urinary Tract: No adrenal gland nodules. Renal nephrograms are homogeneous and symmetrical. Subcentimeter cysts in both kidneys. 2 mm stone in the midportion  left kidney. No ureteral or bladder stones. Bladder wall is not thickened. Stomach/Bowel: There is a midline ventral abdominal wall hernia in the low pelvic region containing small bowel. There is evidence of small bowel obstruction caused by the hernia. Proximal small bowel are mildly dilated with small bowel feces sign and air-fluid levels. There is mild of wall thickening of the small bowel near the hernia with some infiltration in the adjacent fat suggesting fat necrosis and possible strangulation. Colon is decompressed with scattered stool. Appendix is surgically absent. Vascular/Lymphatic: Aortic atherosclerosis. No enlarged abdominal or pelvic lymph nodes. Reproductive: Uterus and bilateral adnexa are unremarkable. Other: No free air or free fluid in the abdomen. Musculoskeletal: Degenerative changes in the spine. No destructive bone lesions. IMPRESSION: 1. Midline ventral abdominal wall hernia in the low pelvis containing small bowel with proximal obstruction. There is evidence of strangulation with bowel wall  thickening and infiltration in the herniated fat. 2. Nonobstructing stone in the left kidney. 3. Aortic atherosclerosis. Electronically Signed   By: Lucienne Capers M.D.   On: 09/05/2017 04:18    No results found.  No results found.    Assessment and Plan: Patient Active Problem List   Diagnosis Date Noted  . Irritable bowel syndrome with diarrhea 06/28/2019  . Primary insomnia 05/31/2019  . Chronic right shoulder pain 04/14/2019  . Excessive daytime sleepiness 04/14/2019  . Hypercalcemia 03/18/2019  . Primary hyperparathyroidism ([place]) 03/18/2019  . Iron deficiency anemia 03/18/2019  . Body mass index 29.0-29.9, adult 03/18/2019  . Status post surgery 09/08/2017  . Incisional hernia of anterior abdominal wall with obstruction 09/05/2017  . Abdominal pain 10/18/2016  . Thyroid disease 06/12/2016  . Allergy-induced asthma 06/12/2016  . Frequent headaches 06/12/2016  . Acute appendicitis 03/11/2016  . Insomnia, persistent 10/02/2015  . Chronic insomnia 10/02/2015  . Degeneration of intervertebral disc of lumbar region 05/19/2015  . Lumbar radiculitis 05/19/2015   1. Nocturnal hypoxia Continue to use oxygen as prescribed.   2. Primary insomnia Sample of Belsomra given 15mg .  Instructed patient to not take other sleeping medications with this.   3. Excessive daytime sleepiness Continue to focus on sleep hygiene.     General Counseling: I have discussed the findings of the evaluation and examination with Danielle Nash.  I have also discussed any further diagnostic evaluation thatmay be needed or ordered today. Danielle Nash verbalizes understanding of the findings of todays visit. We also reviewed her medications today and discussed drug interactions and side effects including but not limited excessive drowsiness and altered mental states. We also discussed that there is always a risk not just to her but also people around her. she has been encouraged to call the office with any questions or  concerns that should arise related to todays visit.    Time spent: 15 This patient was seen by Orson Gear AGNP-C in Collaboration with Dr. Devona Konig as a part of collaborative care agreement.   I have personally obtained a history, examined the patient, evaluated laboratory and imaging results, formulated the assessment and plan and placed orders.    Allyne Gee, MD [facility] Pulmonary and Critical Care Sleep medicine

## 2019-07-20 ENCOUNTER — Ambulatory Visit: Payer: Medicare HMO | Admitting: Internal Medicine

## 2019-07-23 ENCOUNTER — Telehealth: Payer: Self-pay

## 2019-07-23 NOTE — Telephone Encounter (Signed)
CMN SIGNED AND PLACED IN [facility] FOLDER. °

## 2019-08-14 DIAGNOSIS — J4520 Mild intermittent asthma, uncomplicated: Secondary | ICD-10-CM | POA: Diagnosis not present

## 2019-08-26 ENCOUNTER — Telehealth: Payer: Self-pay

## 2019-08-26 NOTE — Telephone Encounter (Signed)
Patient states she does not feel the need for oxygen and she will be returning it and states she is still not sleeping at night, I advised pt she will need to see provider and discuss symptoms. Beth

## 2019-09-07 ENCOUNTER — Other Ambulatory Visit: Payer: Self-pay

## 2019-09-07 ENCOUNTER — Encounter: Payer: Self-pay | Admitting: Internal Medicine

## 2019-09-07 ENCOUNTER — Ambulatory Visit: Payer: Medicare HMO | Admitting: Internal Medicine

## 2019-09-07 VITALS — BP 120/72 | HR 87 | Resp 16 | Ht 60.0 in | Wt 154.0 lb

## 2019-09-07 DIAGNOSIS — R0602 Shortness of breath: Secondary | ICD-10-CM | POA: Diagnosis not present

## 2019-09-07 DIAGNOSIS — G4700 Insomnia, unspecified: Secondary | ICD-10-CM

## 2019-09-07 DIAGNOSIS — R5383 Other fatigue: Secondary | ICD-10-CM

## 2019-09-07 DIAGNOSIS — G4734 Idiopathic sleep related nonobstructive alveolar hypoventilation: Secondary | ICD-10-CM

## 2019-09-07 NOTE — Progress Notes (Signed)
[facility] [place], [place] [address]  Pulmonary Sleep Medicine   Office Visit Note  Patient Name: Danielle Nash DOB: 10-20-1951 MRN [id number]  Date of Service: 09/07/2019  Complaints/HPI: Patient is here for follow-up.  Claims to have insomnia and she states that the last overnight oximetry that she had done she states that she did not sleep very well.  I reviewed the oximetry and she told me that she actually took Ambien for the oximetry.  During this oximetry she had pretty severe desaturations with the lowest oxygen of 76%.  She however refuses to move the oxygen.  I explained to her that it is important that she use the oxygen during the nighttime because she spent over 200 minutes below 88% and this could cause problems for her as far as her cardiopulmonary status is concerned.  She states that she does not sleep enough and feels that does not need the oxygen at nighttime.  Also she did have a sleep study done at home which did not show any significant obstructive sleep apnea.  This would lead me to believe that she may have some underlying cardiopulmonary issues which is the reason for her oxygen desaturations.  The patient and I discussed this at length I recommended to her that we check a pulmonary function study because she does have some shortness of breath with exertion and also she was exposed to secondhand smoke all her life through her husband.  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  ROS  General: (-) fever, (-) chills, (-) night sweats, (-) weakness Skin: (-) rashes, (-) itching,. Eyes: (-) visual changes, (-) redness, (-) itching. Nose and Sinuses: (-) nasal stuffiness or itchiness, (-) postnasal drip, (-) nosebleeds, (-) sinus trouble. Mouth and Throat: (-) sore throat, (-) hoarseness. Neck: (-) swollen glands, (-) enlarged thyroid, (-) neck  pain. Respiratory: - cough, (-) bloody sputum, - shortness of breath, - wheezing. Cardiovascular: - ankle swelling, (-) chest pain. Lymphatic: (-) lymph node enlargement. Neurologic: (-) numbness, (-) tingling. Psychiatric: (-) anxiety, (-) depression   Current Medication: Outpatient Encounter Medications as of 09/07/2019  Medication Sig  . B Complex Vitamins (VITAMIN-B COMPLEX) TABS Take 1 tablet by mouth 1 day or 1 dose.  . butalbital-acetaminophen-caffeine (FIORICET, ESGIC) 50-325-40 MG tablet Take 2 tablets by mouth 2 (two) times daily as needed for migraine.   . Calcium Carb-Cholecalciferol (CALCIUM-VITAMIN D) 500-200 MG-UNIT tablet Take 1 tablet by mouth 1 day or 1 dose.  . Cholecalciferol (VITAMIN D) 2000 units tablet Take 4,000 Units by mouth 1 day or 1 dose.  . dicyclomine (BENTYL) 10 MG capsule Take 1 capsule (10 mg total) by mouth 4 (four) times daily -  before meals and at bedtime.  . folic acid (FOLVITE) A999333 MCG tablet Take 1 tablet by mouth 1 day or 1 dose.  Marland Kitchen MAGNESIUM PO Take 1 tablet by mouth 1 day or 1 dose.  . Multiple Vitamin (MULTI-VITAMINS) TABS Take 1 tablet by mouth 1 day or 1 dose.  . Omega-3 Fatty Acids (FISH OIL PO) Take 1 capsule by mouth 1 day or 1 dose.  . phentermine (ADIPEX-P) 37.5 MG tablet Take 1 tablet (37.5 mg total) by mouth daily before breakfast.  . Probiotic Product (PROBIOTIC & ACIDOPHILUS EX ST) CAPS Take 1 tablet by mouth 1 day or 1 dose.  Marland Kitchen tiZANidine (ZANAFLEX) 2 MG tablet Take  2 mg by mouth every 6 (six) hours as needed for muscle spasms.  . vitamin B-12 (CYANOCOBALAMIN) 1000 MCG tablet Take 1 tablet by mouth 1 day or 1 dose.  . vitamin E 400 UNIT capsule Take 1 capsule by mouth 1 day or 1 dose.  . zolpidem (AMBIEN) 10 MG tablet Take 1/2 to 1 tablet po QHS prn insomnia  . [DISCONTINUED] OXYGEN Inhale 2 L into the lungs. At night time   No facility-administered encounter medications on file as of 09/07/2019.     Surgical History: Past  Surgical History:  Procedure Laterality Date  . INCISIONAL HERNIA REPAIR N/A 09/05/2017   Procedure: HERNIA REPAIR INCISIONAL;  Surgeon: Vickie Epley, MD;  Location: [facility] ORS;  Service: General;  Laterality: N/A;  . LAPAROSCOPIC APPENDECTOMY N/A 10/19/2016   Procedure: APPENDECTOMY LAPAROSCOPIC;  Surgeon: Dia Crawford III, MD;  Location: [facility] ORS;  Service: General;  Laterality: N/A;  . MEDIAL PARTIAL KNEE REPLACEMENT Bilateral   . THYROID SURGERY      Medical History: Past Medical History:  Diagnosis Date  . Allergy   . Allergy-induced asthma   . Frequent headaches   . Irritable bowel syndrome (IBS)   . Thyroid disease     Family History: Family History  Problem Relation Age of Onset  . Arthritis Mother   . Diabetes Mother   . Heart disease Father   . Breast cancer Sister   . Diabetes Maternal Uncle     Social History: Social History   Socioeconomic History  . Marital status: Married    Spouse name: Not on file  . Number of children: Not on file  . Years of education: Not on file  . Highest education level: Not on file  Occupational History  . Not on file  Social Needs  . Financial resource strain: Not on file  . Food insecurity    Worry: Not on file    Inability: Not on file  . Transportation needs    Medical: Not on file    Non-medical: Not on file  Tobacco Use  . Smoking status: Never Smoker  . Smokeless tobacco: Never Used  Substance and Sexual Activity  . Alcohol use: No    Alcohol/week: 0.0 standard drinks  . Drug use: No  . Sexual activity: Never  Lifestyle  . Physical activity    Days per week: Not on file    Minutes per session: Not on file  . Stress: Not on file  Relationships  . Social Herbalist on phone: Not on file    Gets together: Not on file    Attends religious service: Not on file    Active member of club or organization: Not on file    Attends meetings of clubs or organizations: Not on file    Relationship status: Not  on file  . Intimate partner violence    Fear of current or ex partner: Not on file    Emotionally abused: Not on file    Physically abused: Not on file    Forced sexual activity: Not on file  Other Topics Concern  . Not on file  Social History Narrative  . Not on file    Vital Signs: Blood pressure 120/72, pulse 87, resp. rate 16, height 5' (1.524 m), weight 154 lb (69.9 kg), SpO2 98 %.  Examination: General Appearance: The patient is well-developed, well-nourished, and in no distress. Skin: Gross inspection of skin unremarkable. Head: normocephalic, no gross deformities. Eyes:  no gross deformities noted. ENT: ears appear grossly normal no exudates. Neck: Supple. No thyromegaly. No LAD. Respiratory: no rhonchi noted. Cardiovascular: Normal S1 and S2 without murmur or rub. Extremities: No cyanosis. pulses are equal. Neurologic: Alert and oriented. No involuntary movements.  LABS: No results found for this or any previous visit (from the past 2160 hour(s)).  Radiology: Ct Abdomen Pelvis W Contrast  Result Date: 09/05/2017 CLINICAL DATA:  Nausea and vomiting since 8 p.m. Generalized abdominal pain. History of irritable bowel syndrome and appendectomy. EXAM: CT ABDOMEN AND PELVIS WITH CONTRAST TECHNIQUE: Multidetector CT imaging of the abdomen and pelvis was performed using the standard protocol following bolus administration of intravenous contrast. CONTRAST:  153mL ISOVUE-300 IOPAMIDOL (ISOVUE-300) INJECTION 61% COMPARISON:  10/18/2016 FINDINGS: Lower chest: Bilateral lower lung opacities likely representing atelectasis. Hepatobiliary: No focal liver abnormality is seen. No gallstones, gallbladder wall thickening, or biliary dilatation. Pancreas: Unremarkable. No pancreatic ductal dilatation or surrounding inflammatory changes. Spleen: Normal in size without focal abnormality. Adrenals/Urinary Tract: No adrenal gland nodules. Renal nephrograms are homogeneous and symmetrical.  Subcentimeter cysts in both kidneys. 2 mm stone in the midportion left kidney. No ureteral or bladder stones. Bladder wall is not thickened. Stomach/Bowel: There is a midline ventral abdominal wall hernia in the low pelvic region containing small bowel. There is evidence of small bowel obstruction caused by the hernia. Proximal small bowel are mildly dilated with small bowel feces sign and air-fluid levels. There is mild of wall thickening of the small bowel near the hernia with some infiltration in the adjacent fat suggesting fat necrosis and possible strangulation. Colon is decompressed with scattered stool. Appendix is surgically absent. Vascular/Lymphatic: Aortic atherosclerosis. No enlarged abdominal or pelvic lymph nodes. Reproductive: Uterus and bilateral adnexa are unremarkable. Other: No free air or free fluid in the abdomen. Musculoskeletal: Degenerative changes in the spine. No destructive bone lesions. IMPRESSION: 1. Midline ventral abdominal wall hernia in the low pelvis containing small bowel with proximal obstruction. There is evidence of strangulation with bowel wall thickening and infiltration in the herniated fat. 2. Nonobstructing stone in the left kidney. 3. Aortic atherosclerosis. Electronically Signed   By: Lucienne Capers M.D.   On: 09/05/2017 04:18    No results found.  No results found.    Assessment and Plan: Patient Active Problem List   Diagnosis Date Noted  . Irritable bowel syndrome with diarrhea 06/28/2019  . Primary insomnia 05/31/2019  . Chronic right shoulder pain 04/14/2019  . Excessive daytime sleepiness 04/14/2019  . Hypercalcemia 03/18/2019  . Primary hyperparathyroidism ([place]) 03/18/2019  . Iron deficiency anemia 03/18/2019  . Body mass index 29.0-29.9, adult 03/18/2019  . Status post surgery 09/08/2017  . Incisional hernia of anterior abdominal wall with obstruction 09/05/2017  . Abdominal pain 10/18/2016  . Thyroid disease 06/12/2016  . Allergy-induced  asthma 06/12/2016  . Frequent headaches 06/12/2016  . Acute appendicitis 03/11/2016  . Insomnia, persistent 10/02/2015  . Chronic insomnia 10/02/2015  . Degeneration of intervertebral disc of lumbar region 05/19/2015  . Lumbar radiculitis 05/19/2015    1. Insomnia she may very well have psychological insomnia she did tell me that she worries a lot she ruminates about things that he is going to have to do the following day when she is laying in bed trying to go to sleep.  She in fact states that she tries to go to sleep.  She states that she does not like to take the Ambien to help her sleep also she states she takes  other herbal substances which I did caution her about regarding the fact that some of these herbal substances could have stimulant medications in them to make a person feel better.  She states that she also has tried melatonin but this has not worked on her.  I did explain to her that melatonin available over-the-counter is not a pure substance and therefore may not be the best option for her. 2. Hypoxia sleep related this is of concern and explained to her that the reason this is of concern is that this could signify underlying cardiopulmonary disease I suggested that she get a follow-up overnight oximetry so that we can rule out for certainty that she did not have overnight hypoxia and this time she states that she is not going to take any medications to help her sleep. 3. Other fatigue she says that she feels tired also which may be related to her inability to sleep however she also does have a history of a parathyroidectomy along with this they took out half of her thyroid.  I looked back and could not see that she has had a recent TSH done I would recommend getting a TSH and a free T4 done 4. SOB exposure to secondhand smoke patient will get pulmonary functions to evaluate and these have been scheduled.  General Counseling: I have discussed the findings of the evaluation and  examination with Lyndee Leo.  I have also discussed any further diagnostic evaluation thatmay be needed or ordered today. Calaya verbalizes understanding of the findings of todays visit. We also reviewed her medications today and discussed drug interactions and side effects including but not limited excessive drowsiness and altered mental states. We also discussed that there is always a risk not just to her but also people around her. she has been encouraged to call the office with any questions or concerns that should arise related to todays visit.    Time spent: 25min  I have personally obtained a history, examined the patient, evaluated laboratory and imaging results, formulated the assessment and plan and placed orders.    Allyne Gee, MD [facility] Pulmonary and Critical Care Sleep medicine

## 2019-09-07 NOTE — Patient Instructions (Signed)

## 2019-09-08 LAB — TSH+FREE T4
Free T4: 1.35 ng/dL (ref 0.82–1.77)
TSH: 0.668 u[IU]/mL (ref 0.450–4.500)

## 2019-09-14 ENCOUNTER — Telehealth: Payer: Self-pay

## 2019-09-14 ENCOUNTER — Other Ambulatory Visit: Payer: Self-pay | Admitting: Adult Health

## 2019-09-14 NOTE — Telephone Encounter (Signed)
Gave [facility] order for nocturnal O2. Danielle Nash

## 2019-09-20 DIAGNOSIS — R0602 Shortness of breath: Secondary | ICD-10-CM | POA: Diagnosis not present

## 2019-09-21 DIAGNOSIS — J4520 Mild intermittent asthma, uncomplicated: Secondary | ICD-10-CM | POA: Diagnosis not present

## 2019-09-22 ENCOUNTER — Ambulatory Visit: Payer: Medicare HMO | Admitting: Internal Medicine

## 2019-09-27 ENCOUNTER — Ambulatory Visit: Payer: Medicare HMO | Admitting: Nurse Practitioner

## 2019-09-29 ENCOUNTER — Ambulatory Visit: Payer: Medicare HMO | Admitting: Internal Medicine

## 2019-09-29 ENCOUNTER — Other Ambulatory Visit: Payer: Self-pay

## 2019-09-29 DIAGNOSIS — R0602 Shortness of breath: Secondary | ICD-10-CM

## 2019-09-29 LAB — PULMONARY FUNCTION TEST

## 2019-10-11 NOTE — Procedures (Signed)
[place], [address]  DATE OF SERVICE: September 29, 2019  Complete Pulmonary Function Testing Interpretation:  FINDINGS:  Forced vital capacity is normal.  The FEV1 is normal.  Postbronchodilator no significant change in FEV1.  FEV1 FVC ratio is normal.  Total lung capacity is normal.  Residual volume is decreased residual in total lung capacity ratio is decreased thoracic gas volume is decreased.  DLCO is increased.  IMPRESSION:  This pulmonary function study is within normal limits.  Allyne Gee, MD [facility] Pulmonary Critical Care Medicine Sleep Medicine

## 2019-10-14 ENCOUNTER — Telehealth: Payer: Self-pay

## 2019-10-14 NOTE — Telephone Encounter (Signed)
Per [facility] patient did receive her Oxygen and has been set up. Danielle Nash

## 2019-10-15 ENCOUNTER — Other Ambulatory Visit: Payer: Self-pay

## 2019-10-15 ENCOUNTER — Encounter: Payer: Self-pay | Admitting: Nurse Practitioner

## 2019-10-15 ENCOUNTER — Ambulatory Visit (INDEPENDENT_AMBULATORY_CARE_PROVIDER_SITE_OTHER): Payer: Medicare HMO | Admitting: Nurse Practitioner

## 2019-10-15 VITALS — BP 142/81 | HR 63 | Temp 97.2°F | Resp 16 | Ht 60.0 in | Wt 155.4 lb

## 2019-10-15 DIAGNOSIS — F5101 Primary insomnia: Secondary | ICD-10-CM

## 2019-10-15 DIAGNOSIS — G4719 Other hypersomnia: Secondary | ICD-10-CM | POA: Diagnosis not present

## 2019-10-15 DIAGNOSIS — R69 Illness, unspecified: Secondary | ICD-10-CM | POA: Diagnosis not present

## 2019-10-15 DIAGNOSIS — G4734 Idiopathic sleep related nonobstructive alveolar hypoventilation: Secondary | ICD-10-CM | POA: Diagnosis not present

## 2019-10-15 MED ORDER — ZOLPIDEM TARTRATE 10 MG PO TABS: ORAL_TABLET | ORAL | 2 refills | Status: DC

## 2019-10-15 NOTE — Progress Notes (Signed)
[facility] [place], [place] [address]  Internal MEDICINE  Office Visit Note  Patient Name: Danielle Nash  [id number]  [id number]  Date of Service: 10/31/2019  Chief Complaint  Patient presents with  . Thyroid Problem  . Quality Metric Gaps    pna vacc    The patient is here for follow up visit. States that she is not sleeping well. She had overnight pulse oximetry study. The study showed oxygen desaturations to 78% with average oxygen saturations of 87%. She has been prescribed night time oxygen which she uses sometimes. Hard to sleep with the noise of the machine and feeling of cannula around the neck. She states that she has some shoulder pain and neck pain. Also having intermittent cough.       Current Medication: Outpatient Encounter Medications as of 10/15/2019  Medication Sig  . B Complex Vitamins (VITAMIN-B COMPLEX) TABS Take 1 tablet by mouth 1 day or 1 dose.  . butalbital-acetaminophen-caffeine (FIORICET, ESGIC) 50-325-40 MG tablet Take 2 tablets by mouth 2 (two) times daily as needed for migraine.   . Calcium Carb-Cholecalciferol (CALCIUM-VITAMIN D) 500-200 MG-UNIT tablet Take 1 tablet by mouth 1 day or 1 dose.  . Cholecalciferol (VITAMIN D) 2000 units tablet Take 4,000 Units by mouth 1 day or 1 dose.  . dicyclomine (BENTYL) 10 MG capsule Take 1 capsule (10 mg total) by mouth 4 (four) times daily -  before meals and at bedtime.  . folic acid (FOLVITE) A999333 MCG tablet Take 1 tablet by mouth 1 day or 1 dose.  Marland Kitchen MAGNESIUM PO Take 1 tablet by mouth 1 day or 1 dose.  . Multiple Vitamin (MULTI-VITAMINS) TABS Take 1 tablet by mouth 1 day or 1 dose.  . Omega-3 Fatty Acids (FISH OIL PO) Take 1 capsule by mouth 1 day or 1 dose.  . Probiotic Product (PROBIOTIC & ACIDOPHILUS EX ST) CAPS Take 1 tablet by mouth 1 day or 1 dose.  Marland Kitchen tiZANidine (ZANAFLEX) 2 MG tablet Take 2 mg by mouth every 6 (six) hours as needed for muscle spasms.  . vitamin B-12  (CYANOCOBALAMIN) 1000 MCG tablet Take 1 tablet by mouth 1 day or 1 dose.  . vitamin E 400 UNIT capsule Take 1 capsule by mouth 1 day or 1 dose.  . zolpidem (AMBIEN) 10 MG tablet Take 1/2 to 1 tablet po QHS prn insomnia  . [DISCONTINUED] zolpidem (AMBIEN) 10 MG tablet Take 1/2 to 1 tablet po QHS prn insomnia  . [DISCONTINUED] phentermine (ADIPEX-P) 37.5 MG tablet Take 1 tablet (37.5 mg total) by mouth daily before breakfast. (Patient not taking: Reported on 10/15/2019)   No facility-administered encounter medications on file as of 10/15/2019.     Surgical History: Past Surgical History:  Procedure Laterality Date  . INCISIONAL HERNIA REPAIR N/A 09/05/2017   Procedure: HERNIA REPAIR INCISIONAL;  Surgeon: Vickie Epley, MD;  Location: [facility] ORS;  Service: General;  Laterality: N/A;  . LAPAROSCOPIC APPENDECTOMY N/A 10/19/2016   Procedure: APPENDECTOMY LAPAROSCOPIC;  Surgeon: Dia Crawford III, MD;  Location: [facility] ORS;  Service: General;  Laterality: N/A;  . MEDIAL PARTIAL KNEE REPLACEMENT Bilateral   . THYROID SURGERY      Medical History: Past Medical History:  Diagnosis Date  . Allergy   . Allergy-induced asthma   . Frequent headaches   . Irritable bowel syndrome (IBS)   . Thyroid disease     Family History: Family History  Problem Relation Age of Onset  . Arthritis  Mother   . Diabetes Mother   . Heart disease Father   . Breast cancer Sister   . Diabetes Maternal Uncle     Social History   Socioeconomic History  . Marital status: Married    Spouse name: Not on file  . Number of children: Not on file  . Years of education: Not on file  . Highest education level: Not on file  Occupational History  . Not on file  Social Needs  . Financial resource strain: Not on file  . Food insecurity    Worry: Not on file    Inability: Not on file  . Transportation needs    Medical: Not on file    Non-medical: Not on file  Tobacco Use  . Smoking status: Never Smoker  . Smokeless  tobacco: Never Used  Substance and Sexual Activity  . Alcohol use: No    Alcohol/week: 0.0 standard drinks  . Drug use: No  . Sexual activity: Never  Lifestyle  . Physical activity    Days per week: Not on file    Minutes per session: Not on file  . Stress: Not on file  Relationships  . Social Herbalist on phone: Not on file    Gets together: Not on file    Attends religious service: Not on file    Active member of club or organization: Not on file    Attends meetings of clubs or organizations: Not on file    Relationship status: Not on file  . Intimate partner violence    Fear of current or ex partner: Not on file    Emotionally abused: Not on file    Physically abused: Not on file    Forced sexual activity: Not on file  Other Topics Concern  . Not on file  Social History Narrative  . Not on file      Review of Systems  Constitutional: Negative for chills, fatigue and unexpected weight change.       Nine pound weight gain since her last visit.   HENT: Negative for congestion, postnasal drip, rhinorrhea, sneezing, sore throat and voice change.   Respiratory: Negative for cough, chest tightness, shortness of breath and wheezing.   Cardiovascular: Negative for chest pain and palpitations.  Gastrointestinal: Negative for abdominal pain, constipation, diarrhea, nausea and vomiting.  Endocrine: Negative for cold intolerance, heat intolerance, polydipsia and polyuria.  Musculoskeletal: Positive for arthralgias, joint swelling and myalgias. Negative for back pain and neck pain.       Worsening right shoulder pain. Now starting to get worse in left shoulder pain.   Skin: Negative for rash.  Allergic/Immunologic: Negative for environmental allergies.  Neurological: Negative for dizziness, tremors, numbness and headaches.  Hematological: Negative for adenopathy. Does not bruise/bleed easily.  Psychiatric/Behavioral: Positive for sleep disturbance. Negative for  behavioral problems (Depression) and suicidal ideas. The patient is not nervous/anxious.     Today's Vitals   10/15/19 1436  BP: (!) 142/81  Pulse: 63  Resp: 16  Temp: (!) 97.2 F (36.2 C)  SpO2: 97%  Weight: 155 lb 6.4 oz (70.5 kg)  Height: 5' (1.524 m)   Body mass index is 30.35 kg/m.  Physical Exam Vitals signs and nursing note reviewed.  Constitutional:      General: She is not in acute distress.    Appearance: Normal appearance. She is well-developed. She is not diaphoretic.  HENT:     Head: Normocephalic and atraumatic.  Mouth/Throat:     Mouth: Mucous membranes are moist.     Pharynx: Oropharynx is clear. No oropharyngeal exudate.  Eyes:     Extraocular Movements: Extraocular movements intact.     Pupils: Pupils are equal, round, and reactive to light.  Neck:     Musculoskeletal: Normal range of motion and neck supple.     Thyroid: No thyromegaly.     Vascular: No JVD.     Trachea: No tracheal deviation.  Cardiovascular:     Rate and Rhythm: Normal rate and regular rhythm.     Heart sounds: Normal heart sounds. No murmur. No friction rub. No gallop.   Pulmonary:     Effort: Pulmonary effort is normal. No respiratory distress.     Breath sounds: Normal breath sounds. No wheezing or rales.  Chest:     Chest wall: No tenderness.  Abdominal:     General: Bowel sounds are normal.     Palpations: Abdomen is soft.  Musculoskeletal: Normal range of motion.        General: No tenderness.  Lymphadenopathy:     Cervical: No cervical adenopathy.  Skin:    General: Skin is warm and dry.  Neurological:     Mental Status: She is alert and oriented to person, place, and time.     Cranial Nerves: No cranial nerve deficit.  Psychiatric:        Mood and Affect: Mood normal.        Behavior: Behavior normal.        Thought Content: Thought content normal.        Judgment: Judgment normal.    Assessment/Plan: 1. Hypoxia, sleep related Reviewed results of patient's  overnight pulse oximetry study. She is positive for nocturnal hypoxia. Encouraged her to use the prescribed oxygen every night. Discussed symptoms she is having are likely due to nocturnal hypoxia.   2. Primary insomnia May take ambien 10mg  at bedtime as needed for insomnia. Refill sent to her pharmacy.  - zolpidem (AMBIEN) 10 MG tablet; Take 1/2 to 1 tablet po QHS prn insomnia  Dispense: 30 tablet; Refill: 2  3. Excessive daytime sleepiness Likely from nocturnal hypoxia. Will monitor.   General Counseling: analice holtz understanding of the findings of todays visit and agrees with plan of treatment. I have discussed any further diagnostic evaluation that may be needed or ordered today. We also reviewed her medications today. she has been encouraged to call the office with any questions or concerns that should arise related to todays visit.  This patient was seen by [place] with Dr Lavera Guise as a part of collaborative care agreement  Meds ordered this encounter  Medications  . zolpidem (AMBIEN) 10 MG tablet    Sig: Take 1/2 to 1 tablet po QHS prn insomnia    Dispense:  30 tablet    Refill:  2    Please note increase in dose.    Order Specific Question:   Supervising Provider    Answer:   Lavera Guise [id number]    Time spent: 29 Minutes      Dr Lavera Guise Internal medicine

## 2019-10-22 DIAGNOSIS — J4520 Mild intermittent asthma, uncomplicated: Secondary | ICD-10-CM | POA: Diagnosis not present

## 2019-10-31 DIAGNOSIS — G4734 Idiopathic sleep related nonobstructive alveolar hypoventilation: Secondary | ICD-10-CM | POA: Insufficient documentation

## 2019-11-09 ENCOUNTER — Telehealth: Payer: Self-pay

## 2019-11-09 NOTE — Telephone Encounter (Signed)
Confirmed appointment with patient. klh °

## 2019-11-16 ENCOUNTER — Ambulatory Visit (INDEPENDENT_AMBULATORY_CARE_PROVIDER_SITE_OTHER): Payer: Medicare HMO | Admitting: Internal Medicine

## 2019-11-16 VITALS — BP 124/78 | HR 80 | Temp 97.3°F | Resp 16 | Ht 60.0 in | Wt 151.0 lb

## 2019-11-16 DIAGNOSIS — F5101 Primary insomnia: Secondary | ICD-10-CM

## 2019-11-16 DIAGNOSIS — R0602 Shortness of breath: Secondary | ICD-10-CM

## 2019-11-16 DIAGNOSIS — G4734 Idiopathic sleep related nonobstructive alveolar hypoventilation: Secondary | ICD-10-CM | POA: Diagnosis not present

## 2019-11-17 ENCOUNTER — Encounter: Payer: Self-pay | Admitting: Internal Medicine

## 2019-12-06 NOTE — Progress Notes (Signed)
[facility] [place], [place] [address]  Pulmonary Sleep Medicine   Office Visit Note  Patient Name: Danielle Nash DOB: [birth year] MRN [id number]  Date of Service: 11/16/2019  Complaints/HPI: Patient had done pulmonary function done back in October.  Found to have a normal FEV1 as well as FEV1 FEC ratio is a basically normal pulmonary function spirometry was actually within normal limits.  Continues to have issues with insomnia she had an overnight oximetry done last time did not sleep well her oxygen saturations had gone down to 76%.  She however refuses to use the oxygen.  Indeed on discussion with her and reviewing the objective data she feels like she does not have any issues that can be explained by her oxygen desaturations.  She was explained that this could be her problem however she still is not willing to accept this and she does not want to be treated with nocturnal oxygen.  She did have a sleep study done which did not show any obstructive sleep apnea.  And the pulmonary function studies are as above.  At the end of our conversation it was essentially decided that she will make a final decision about whether or not she wants to go on oxygen all the time which is what she should be doing  ROS  General: (-) fever, (-) chills, (-) night sweats, (-) weakness Skin: (-) rashes, (-) itching,. Eyes: (-) visual changes, (-) redness, (-) itching. Nose and Sinuses: (-) nasal stuffiness or itchiness, (-) postnasal drip, (-) nosebleeds, (-) sinus trouble. Mouth and Throat: (-) sore throat, (-) hoarseness. Neck: (-) swollen glands, (-) enlarged thyroid, (-) neck pain. Respiratory: + cough, (-) bloody sputum, + shortness of breath, - wheezing. Cardiovascular: - ankle swelling, (-) chest pain. Lymphatic: (-) lymph node enlargement. Neurologic: (-) numbness, (-) tingling. Psychiatric: (-) anxiety, (-) depression   Current Medication: Outpatient Encounter Medications  as of 11/16/2019  Medication Sig  . B Complex Vitamins (VITAMIN-B COMPLEX) TABS Take 1 tablet by mouth 1 day or 1 dose.  . Calcium Carb-Cholecalciferol (CALCIUM-VITAMIN D) 500-200 MG-UNIT tablet Take 1 tablet by mouth 1 day or 1 dose.  . Cholecalciferol (VITAMIN D) 2000 units tablet Take 4,000 Units by mouth 1 day or 1 dose.  . dicyclomine (BENTYL) 10 MG capsule Take 1 capsule (10 mg total) by mouth 4 (four) times daily -  before meals and at bedtime.  . folic acid (FOLVITE) A999333 MCG tablet Take 1 tablet by mouth 1 day or 1 dose.  Marland Kitchen MAGNESIUM PO Take 1 tablet by mouth 1 day or 1 dose.  . Multiple Vitamin (MULTI-VITAMINS) TABS Take 1 tablet by mouth 1 day or 1 dose.  . Omega-3 Fatty Acids (FISH OIL PO) Take 1 capsule by mouth 1 day or 1 dose.  . Probiotic Product (PROBIOTIC & ACIDOPHILUS EX ST) CAPS Take 1 tablet by mouth 1 day or 1 dose.  Marland Kitchen tiZANidine (ZANAFLEX) 2 MG tablet Take 2 mg by mouth every 6 (six) hours as needed for muscle spasms.  . vitamin B-12 (CYANOCOBALAMIN) 1000 MCG tablet Take 1 tablet by mouth 1 day or 1 dose.  . vitamin E 400 UNIT capsule Take 1 capsule by mouth 1 day or 1 dose.  . butalbital-acetaminophen-caffeine (FIORICET, ESGIC) 50-325-40 MG tablet Take 2 tablets by mouth 2 (two) times daily as needed for migraine.   Marland Kitchen zolpidem (AMBIEN) 10 MG tablet Take 1/2 to 1 tablet po QHS prn insomnia (Patient not taking: Reported on  11/17/2019)   No facility-administered encounter medications on file as of 11/16/2019.    Surgical History: Past Surgical History:  Procedure Laterality Date  . INCISIONAL HERNIA REPAIR N/A 09/05/2017   Procedure: HERNIA REPAIR INCISIONAL;  Surgeon: Vickie Epley, MD;  Location: [facility] ORS;  Service: General;  Laterality: N/A;  . LAPAROSCOPIC APPENDECTOMY N/A 10/19/2016   Procedure: APPENDECTOMY LAPAROSCOPIC;  Surgeon: Dia Crawford III, MD;  Location: [facility] ORS;  Service: General;  Laterality: N/A;  . MEDIAL PARTIAL KNEE REPLACEMENT Bilateral   . THYROID  SURGERY      Medical History: Past Medical History:  Diagnosis Date  . Allergy   . Allergy-induced asthma   . Frequent headaches   . Irritable bowel syndrome (IBS)   . Thyroid disease     Family History: Family History  Problem Relation Age of Onset  . Arthritis Mother   . Diabetes Mother   . Heart disease Father   . Breast cancer Sister   . Diabetes Maternal Uncle     Social History: Social History   Socioeconomic History  . Marital status: Married    Spouse name: Not on file  . Number of children: Not on file  . Years of education: Not on file  . Highest education level: Not on file  Occupational History  . Not on file  Tobacco Use  . Smoking status: Never Smoker  . Smokeless tobacco: Never Used  Substance and Sexual Activity  . Alcohol use: No    Alcohol/week: 0.0 standard drinks  . Drug use: No  . Sexual activity: Never  Other Topics Concern  . Not on file  Social History Narrative  . Not on file   Social Determinants of Health   Financial Resource Strain:   . Difficulty of Paying Living Expenses: Not on file  Food Insecurity:   . Worried About Charity fundraiser in the Last Year: Not on file  . Ran Out of Food in the Last Year: Not on file  Transportation Needs:   . Lack of Transportation (Medical): Not on file  . Lack of Transportation (Non-Medical): Not on file  Physical Activity:   . Days of Exercise per Week: Not on file  . Minutes of Exercise per Session: Not on file  Stress:   . Feeling of Stress : Not on file  Social Connections:   . Frequency of Communication with Friends and Family: Not on file  . Frequency of Social Gatherings with Friends and Family: Not on file  . Attends Religious Services: Not on file  . Active Member of Clubs or Organizations: Not on file  . Attends Archivist Meetings: Not on file  . Marital Status: Not on file  Intimate Partner Violence:   . Fear of Current or Ex-Partner: Not on file  .  Emotionally Abused: Not on file  . Physically Abused: Not on file  . Sexually Abused: Not on file    Vital Signs: Blood pressure 124/78, pulse 80, temperature (!) 97.3 F (36.3 C), resp. rate 16, height 5' (1.524 m), weight 151 lb (68.5 kg), SpO2 98 %.  Examination: General Appearance: The patient is well-developed, well-nourished, and in no distress. Skin: Gross inspection of skin unremarkable. Head: normocephalic, no gross deformities. Eyes: no gross deformities noted. ENT: ears appear grossly normal no exudates. Neck: Supple. No thyromegaly. No LAD. Respiratory: No rhonchi no rales are noted at this time. Cardiovascular: Normal S1 and S2 without murmur or rub. Extremities: No cyanosis. pulses are  equal. Neurologic: Alert and oriented. No involuntary movements.  LABS: Recent Results (from the past 2160 hour(s))  Pulmonary function test     Status: None   Collection Time: 09/29/19  9:00 AM  Result Value Ref Range   FEV1     FVC     FEV1/FVC     TLC     DLCO      Radiology: CT Abdomen Pelvis W Contrast  Result Date: 09/05/2017 CLINICAL DATA:  Nausea and vomiting since 8 p.m. Generalized abdominal pain. History of irritable bowel syndrome and appendectomy. EXAM: CT ABDOMEN AND PELVIS WITH CONTRAST TECHNIQUE: Multidetector CT imaging of the abdomen and pelvis was performed using the standard protocol following bolus administration of intravenous contrast. CONTRAST:  12mL ISOVUE-300 IOPAMIDOL (ISOVUE-300) INJECTION 61% COMPARISON:  10/18/2016 FINDINGS: Lower chest: Bilateral lower lung opacities likely representing atelectasis. Hepatobiliary: No focal liver abnormality is seen. No gallstones, gallbladder wall thickening, or biliary dilatation. Pancreas: Unremarkable. No pancreatic ductal dilatation or surrounding inflammatory changes. Spleen: Normal in size without focal abnormality. Adrenals/Urinary Tract: No adrenal gland nodules. Renal nephrograms are homogeneous and symmetrical.  Subcentimeter cysts in both kidneys. 2 mm stone in the midportion left kidney. No ureteral or bladder stones. Bladder wall is not thickened. Stomach/Bowel: There is a midline ventral abdominal wall hernia in the low pelvic region containing small bowel. There is evidence of small bowel obstruction caused by the hernia. Proximal small bowel are mildly dilated with small bowel feces sign and air-fluid levels. There is mild of wall thickening of the small bowel near the hernia with some infiltration in the adjacent fat suggesting fat necrosis and possible strangulation. Colon is decompressed with scattered stool. Appendix is surgically absent. Vascular/Lymphatic: Aortic atherosclerosis. No enlarged abdominal or pelvic lymph nodes. Reproductive: Uterus and bilateral adnexa are unremarkable. Other: No free air or free fluid in the abdomen. Musculoskeletal: Degenerative changes in the spine. No destructive bone lesions. IMPRESSION: 1. Midline ventral abdominal wall hernia in the low pelvis containing small bowel with proximal obstruction. There is evidence of strangulation with bowel wall thickening and infiltration in the herniated fat. 2. Nonobstructing stone in the left kidney. 3. Aortic atherosclerosis. Electronically Signed   By: Lucienne Capers M.D.   On: 09/05/2017 04:18    No results found.  No results found.    Assessment and Plan: Patient Active Problem List   Diagnosis Date Noted  . Hypoxia, sleep related 10/31/2019  . Irritable bowel syndrome with diarrhea 06/28/2019  . Primary insomnia 05/31/2019  . Chronic right shoulder pain 04/14/2019  . Excessive daytime sleepiness 04/14/2019  . Hypercalcemia 03/18/2019  . Primary hyperparathyroidism ([place]) 03/18/2019  . Iron deficiency anemia 03/18/2019  . Body mass index 29.0-29.9, adult 03/18/2019  . Status post surgery 09/08/2017  . Incisional hernia of anterior abdominal wall with obstruction 09/05/2017  . Abdominal pain 10/18/2016  . Thyroid  disease 06/12/2016  . Allergy-induced asthma 06/12/2016  . Frequent headaches 06/12/2016  . Acute appendicitis 03/11/2016  . Insomnia, persistent 10/02/2015  . Chronic insomnia 10/02/2015  . Degeneration of intervertebral disc of lumbar region 05/19/2015  . Lumbar radiculitis 05/19/2015    1. Chronic respiratory failure with hypoxia she will not use oxygen this has been discussed with her on numerous occasions and she will not agree to using the O2.  She feels like she does not need it. 2. Primary insomnia again I feel likely hypoxia at nighttime may be contributing to her insomnia however she does not want to go with the  recommended treatment course.  She also has refused to take Ambien but she takes some herbal substances which again I cautioned her on the last time that she was here to see me in the office.  Mainly because some of the substances could also have stimulants.  Also talked to her about melatonin and she was counseled about this. 3. Shortness of breath not clearly explainable by her pulmonary function study results the patient has normal PFT results she will not use medications as had been recommended to her in the form of inhalers to see if that actually helps.  Also she will not use oxygen because she feels that she does not need to use it.  General Counseling: I have discussed the findings of the evaluation and examination with Lyndee Leo.  I have also discussed any further diagnostic evaluation thatmay be needed or ordered today. Maryalice verbalizes understanding of the findings of todays visit. We also reviewed her medications today and discussed drug interactions and side effects including but not limited excessive drowsiness and altered mental states. We also discussed that there is always a risk not just to her but also people around her. she has been encouraged to call the office with any questions or concerns that should arise related to todays visit.  No orders of the defined  types were placed in this encounter.    Time spent: 25 minutes  I have personally obtained a history, examined the patient, evaluated laboratory and imaging results, formulated the assessment and plan and placed orders.    Allyne Gee, MD [facility] Pulmonary and Critical Care Sleep medicine

## 2019-12-22 ENCOUNTER — Telehealth: Payer: Self-pay

## 2019-12-22 NOTE — Telephone Encounter (Signed)
CONFIRMED AND SCREENED FOR 12-24-19 OV. °

## 2019-12-24 ENCOUNTER — Ambulatory Visit: Payer: Medicare HMO | Admitting: Nurse Practitioner

## 2020-01-17 ENCOUNTER — Ambulatory Visit: Payer: Medicare HMO | Admitting: Nurse Practitioner

## 2020-01-25 ENCOUNTER — Telehealth: Payer: Self-pay

## 2020-01-25 NOTE — Telephone Encounter (Signed)
CONFIRMED AND SCREENED FOR 01-27-20 OV. 

## 2020-01-27 ENCOUNTER — Ambulatory Visit (INDEPENDENT_AMBULATORY_CARE_PROVIDER_SITE_OTHER): Payer: Medicare HMO | Admitting: Nurse Practitioner

## 2020-01-27 ENCOUNTER — Encounter: Payer: Self-pay | Admitting: Nurse Practitioner

## 2020-01-27 ENCOUNTER — Other Ambulatory Visit: Payer: Self-pay

## 2020-01-27 VITALS — BP 123/81 | HR 76 | Temp 97.3°F | Resp 16 | Ht 60.0 in | Wt 163.8 lb

## 2020-01-27 DIAGNOSIS — G4719 Other hypersomnia: Secondary | ICD-10-CM

## 2020-01-27 DIAGNOSIS — Z0001 Encounter for general adult medical examination with abnormal findings: Secondary | ICD-10-CM | POA: Diagnosis not present

## 2020-01-27 DIAGNOSIS — G4734 Idiopathic sleep related nonobstructive alveolar hypoventilation: Secondary | ICD-10-CM

## 2020-01-27 DIAGNOSIS — G43809 Other migraine, not intractable, without status migrainosus: Secondary | ICD-10-CM

## 2020-01-27 DIAGNOSIS — R300 Dysuria: Secondary | ICD-10-CM | POA: Diagnosis not present

## 2020-01-27 MED ORDER — BUTALBITAL-APAP-CAFFEINE 50-325-40 MG PO TABS: 2.0000 | ORAL_TABLET | Freq: Two times a day (BID) | ORAL | 1 refills | Status: DC | PRN

## 2020-01-27 NOTE — Progress Notes (Signed)
[facility] [place], [place] [address]  Internal MEDICINE  Office Visit Note  Patient Name: Danielle Nash  [id number]  [id number]  Date of Service: 01/27/2020   Pt is here for routine health maintenance examination   Chief Complaint  Patient presents with  . Medicare Wellness  . Hypothyroidism  . Asthma  . Allergies  . Quality Metric Gaps    pna vacc and mammogram  . Cyst    on lower back      The patient is here for health maintenance exam. She states that she has small cyst on the lower left aspect of her back. Feels as though it is getting slightly larger with time. Non tender or painful. Still having trouble with sleep. She states that she has not noted any improvement since starting on nocturnal oxygen. Feels tired all the time, most severe in the afternoons. Does take ambien rarely. Does sleep a little better when she takes this.    Current Medication: Outpatient Encounter Medications as of 01/27/2020  Medication Sig  . B Complex Vitamins (VITAMIN-B COMPLEX) TABS Take 1 tablet by mouth 1 day or 1 dose.  . Calcium Carb-Cholecalciferol (CALCIUM-VITAMIN D) 500-200 MG-UNIT tablet Take 1 tablet by mouth 1 day or 1 dose.  . Cholecalciferol (VITAMIN D) 2000 units tablet Take 4,000 Units by mouth 1 day or 1 dose.  . dicyclomine (BENTYL) 10 MG capsule Take 1 capsule (10 mg total) by mouth 4 (four) times daily -  before meals and at bedtime.  . folic acid (FOLVITE) A999333 MCG tablet Take 1 tablet by mouth 1 day or 1 dose.  Marland Kitchen MAGNESIUM PO Take 1 tablet by mouth 1 day or 1 dose.  . Multiple Vitamin (MULTI-VITAMINS) TABS Take 1 tablet by mouth 1 day or 1 dose.  . Omega-3 Fatty Acids (FISH OIL PO) Take 1 capsule by mouth 1 day or 1 dose.  . Probiotic Product (PROBIOTIC & ACIDOPHILUS EX ST) CAPS Take 1 tablet by mouth 1 day or 1 dose.  Marland Kitchen tiZANidine (ZANAFLEX) 2 MG tablet Take 2 mg by mouth every 6 (six) hours as needed for muscle spasms.  . vitamin B-12  (CYANOCOBALAMIN) 1000 MCG tablet Take 1 tablet by mouth 1 day or 1 dose.  . vitamin E 400 UNIT capsule Take 1 capsule by mouth 1 day or 1 dose.  . zolpidem (AMBIEN) 10 MG tablet Take 1/2 to 1 tablet po QHS prn insomnia  . [DISCONTINUED] butalbital-acetaminophen-caffeine (FIORICET, ESGIC) 50-325-40 MG tablet Take 2 tablets by mouth 2 (two) times daily as needed for migraine.   . butalbital-acetaminophen-caffeine (FIORICET) 50-325-40 MG tablet Take 2 tablets by mouth 2 (two) times daily as needed for migraine.   No facility-administered encounter medications on file as of 01/27/2020.    Surgical History: Past Surgical History:  Procedure Laterality Date  . INCISIONAL HERNIA REPAIR N/A 09/05/2017   Procedure: HERNIA REPAIR INCISIONAL;  Surgeon: Vickie Epley, MD;  Location: [facility] ORS;  Service: General;  Laterality: N/A;  . LAPAROSCOPIC APPENDECTOMY N/A 10/19/2016   Procedure: APPENDECTOMY LAPAROSCOPIC;  Surgeon: Dia Crawford III, MD;  Location: [facility] ORS;  Service: General;  Laterality: N/A;  . MEDIAL PARTIAL KNEE REPLACEMENT Bilateral   . THYROID SURGERY      Medical History: Past Medical History:  Diagnosis Date  . Allergy   . Allergy-induced asthma   . Frequent headaches   . Irritable bowel syndrome (IBS)   . Thyroid disease     Family History:  Family History  Problem Relation Age of Onset  . Arthritis Mother   . Diabetes Mother   . Heart disease Father   . Breast cancer Sister   . Diabetes Maternal Uncle       Review of Systems  Constitutional: Negative for chills, fatigue and unexpected weight change.       Eight pound weight gain since her last visit.   HENT: Negative for congestion, postnasal drip, rhinorrhea, sneezing, sore throat and voice change.   Respiratory: Negative for cough, chest tightness, shortness of breath and wheezing.   Cardiovascular: Negative for chest pain and palpitations.  Gastrointestinal: Negative for abdominal pain, constipation, diarrhea,  nausea and vomiting.  Endocrine: Negative for cold intolerance, heat intolerance, polydipsia and polyuria.  Genitourinary: Negative for dysuria, frequency and urgency.  Musculoskeletal: Positive for arthralgias, joint swelling and myalgias. Negative for back pain and neck pain.  Skin: Negative for rash.       Small cyst on left lower part of the back. Non tender, but getting a bit larger.   Allergic/Immunologic: Negative for environmental allergies.  Neurological: Negative for dizziness, tremors, numbness and headaches.  Hematological: Negative for adenopathy. Does not bruise/bleed easily.  Psychiatric/Behavioral: Positive for sleep disturbance. Negative for behavioral problems (Depression) and suicidal ideas. The patient is not nervous/anxious.      Today's Vitals   01/27/20 1503  BP: 123/81  Pulse: 76  Resp: 16  Temp: (!) 97.3 F (36.3 C)  SpO2: 96%  Weight: 163 lb 12.8 oz (74.3 kg)  Height: 5' (1.524 m)   Body mass index is 31.99 kg/m.  Physical Exam Vitals and nursing note reviewed.  Constitutional:      General: She is not in acute distress.    Appearance: Normal appearance. She is well-developed. She is not diaphoretic.  HENT:     Head: Normocephalic and atraumatic.     Mouth/Throat:     Pharynx: No oropharyngeal exudate.  Eyes:     Pupils: Pupils are equal, round, and reactive to light.  Neck:     Thyroid: No thyromegaly.     Vascular: No JVD.     Trachea: No tracheal deviation.  Cardiovascular:     Rate and Rhythm: Normal rate and regular rhythm.     Heart sounds: Normal heart sounds. No murmur. No friction rub. No gallop.   Pulmonary:     Effort: Pulmonary effort is normal. No respiratory distress.     Breath sounds: No wheezing or rales.  Chest:     Chest wall: No tenderness.     Breasts:        Right: Normal. No swelling, bleeding, inverted nipple, mass, nipple discharge, skin change or tenderness.        Left: Normal. No swelling, bleeding, inverted  nipple, mass, nipple discharge, skin change or tenderness.  Abdominal:     General: Bowel sounds are normal.     Palpations: Abdomen is soft.  Musculoskeletal:        General: Normal range of motion.     Cervical back: Normal range of motion and neck supple.  Lymphadenopathy:     Cervical: No cervical adenopathy.     Upper Body:     Right upper body: No axillary adenopathy.     Left upper body: No axillary adenopathy.  Skin:    General: Skin is warm and dry.       Neurological:     Mental Status: She is alert and oriented to person, place, and time.  Cranial Nerves: No cranial nerve deficit.  Psychiatric:        Behavior: Behavior normal.        Thought Content: Thought content normal.        Judgment: Judgment normal.     Depression screen [facility] 2/9 01/27/2020 10/15/2019 05/31/2019 03/02/2019 06/12/2016  Decreased Interest 0 0 0 0 0  Down, Depressed, Hopeless 0 0 0 0 1  PHQ - 2 Score 0 0 0 0 1    Functional Status Survey: Is the patient deaf or have difficulty hearing?: No Does the patient have difficulty seeing, even when wearing glasses/contacts?: No Does the patient have difficulty concentrating, remembering, or making decisions?: No Does the patient have difficulty walking or climbing stairs?: No Does the patient have difficulty dressing or bathing?: No Does the patient have difficulty doing errands alone such as visiting a doctor's office or shopping?: No  MMSE - Mini Mental State Exam 01/27/2020  Orientation to time 5  Orientation to Place 5  Registration 3  Attention/ Calculation 5  Recall 3  Language- name 2 objects 2  Language- repeat 1  Language- follow 3 step command 3  Language- read & follow direction 1  Write a sentence 1  Copy design 1  Total score 30    Fall Risk  01/27/2020 10/15/2019 05/31/2019 03/02/2019 06/12/2016  Falls in the past year? 0 0 0 0 No    Assessment/Plan: 1. Encounter for general adult medical examination with abnormal findings  Annual health maintenance exam today  2. Other migraine without status migrainosus, not intractable May take fioricet up to twice daily as needed for acute headache. New prescription was sent to her pharmacy.  - butalbital-acetaminophen-caffeine (FIORICET) 50-325-40 MG tablet; Take 2 tablets by mouth 2 (two) times daily as needed for migraine.  Dispense: 30 tablet; Refill: 1  3. Excessive daytime sleepiness Likely due to nocturnal hypoxia. Use oxygen as prescribd  4. Hypoxia, sleep related Continue to use nocturnal oxygen as prescribed   5. Dysuria - UA/M w/rflx Culture, Routine  General Counseling: maryland osterkamp understanding of the findings of todays visit and agrees with plan of treatment. I have discussed any further diagnostic evaluation that may be needed or ordered today. We also reviewed her medications today. she has been encouraged to call the office with any questions or concerns that should arise related to todays visit.    Counseling:  This patient was seen by Leretha Pol FNP Collaboration with Dr Lavera Guise as a part of collaborative care agreement  Orders Placed This Encounter  Procedures  . UA/M w/rflx Culture, Routine    Meds ordered this encounter  Medications  . butalbital-acetaminophen-caffeine (FIORICET) 50-325-40 MG tablet    Sig: Take 2 tablets by mouth 2 (two) times daily as needed for migraine.    Dispense:  30 tablet    Refill:  1    Order Specific Question:   Supervising Provider    Answer:   Lavera Guise [id number]    Total time spent: 40 Minutes  Time spent includes review of chart, medications, test results, and follow up plan with the patient.     Lavera Guise, MD  Internal Medicine

## 2020-01-28 LAB — UA/M W/RFLX CULTURE, ROUTINE
Bilirubin, UA: NEGATIVE
Glucose, UA: NEGATIVE
Leukocytes,UA: NEGATIVE
Nitrite, UA: NEGATIVE
Protein,UA: NEGATIVE
RBC, UA: NEGATIVE
Specific Gravity, UA: 1.021 (ref 1.005–1.030)
Urobilinogen, Ur: 0.2 mg/dL (ref 0.2–1.0)
pH, UA: >=9 — AB (ref 5.0–7.5)

## 2020-01-28 LAB — MICROSCOPIC EXAMINATION: Casts: NONE SEEN /lpf

## 2020-02-21 ENCOUNTER — Ambulatory Visit: Payer: Medicare HMO | Admitting: Internal Medicine

## 2020-04-07 ENCOUNTER — Other Ambulatory Visit: Payer: Self-pay

## 2020-04-07 DIAGNOSIS — K580 Irritable bowel syndrome with diarrhea: Secondary | ICD-10-CM

## 2020-04-07 MED ORDER — DICYCLOMINE HCL 10 MG PO CAPS: 10.0000 mg | ORAL_CAPSULE | Freq: Three times a day (TID) | ORAL | 1 refills | Status: DC

## 2020-07-11 ENCOUNTER — Other Ambulatory Visit: Payer: Self-pay

## 2020-07-11 MED ORDER — TIZANIDINE HCL 2 MG PO TABS: 2.0000 mg | ORAL_TABLET | Freq: Four times a day (QID) | ORAL | 1 refills | Status: DC | PRN

## 2020-07-13 ENCOUNTER — Other Ambulatory Visit: Payer: Self-pay

## 2020-07-13 DIAGNOSIS — G43809 Other migraine, not intractable, without status migrainosus: Secondary | ICD-10-CM

## 2020-07-14 MED ORDER — BUTALBITAL-APAP-CAFFEINE 50-325-40 MG PO TABS: 2.0000 | ORAL_TABLET | Freq: Two times a day (BID) | ORAL | 1 refills | Status: DC | PRN

## 2020-07-27 ENCOUNTER — Ambulatory Visit: Payer: Medicare HMO | Admitting: Nurse Practitioner

## 2020-08-08 ENCOUNTER — Telehealth: Payer: Self-pay

## 2020-08-08 NOTE — Telephone Encounter (Signed)
Lmom to confirm and screen for 08-10-20 ov.

## 2020-08-10 ENCOUNTER — Ambulatory Visit: Payer: Medicare Other | Admitting: Nurse Practitioner

## 2020-08-14 ENCOUNTER — Telehealth: Payer: Self-pay

## 2020-08-14 NOTE — Telephone Encounter (Signed)
LMOM for office visit on 9/1

## 2020-08-14 NOTE — Telephone Encounter (Signed)
Confirmed and screened for 08-16-20 ov. 

## 2020-08-15 ENCOUNTER — Ambulatory Visit: Payer: Medicare Other | Admitting: Nurse Practitioner

## 2020-08-16 ENCOUNTER — Ambulatory Visit (INDEPENDENT_AMBULATORY_CARE_PROVIDER_SITE_OTHER): Payer: Medicare Other | Admitting: Hospice and Palliative Medicine

## 2020-08-16 ENCOUNTER — Encounter: Payer: Self-pay | Admitting: Hospice and Palliative Medicine

## 2020-08-16 ENCOUNTER — Other Ambulatory Visit: Payer: Self-pay

## 2020-08-16 DIAGNOSIS — J302 Other seasonal allergic rhinitis: Secondary | ICD-10-CM

## 2020-08-16 DIAGNOSIS — M7542 Impingement syndrome of left shoulder: Secondary | ICD-10-CM | POA: Diagnosis not present

## 2020-08-16 DIAGNOSIS — G4734 Idiopathic sleep related nonobstructive alveolar hypoventilation: Secondary | ICD-10-CM | POA: Diagnosis not present

## 2020-08-16 DIAGNOSIS — G4719 Other hypersomnia: Secondary | ICD-10-CM | POA: Diagnosis not present

## 2020-08-16 DIAGNOSIS — F5101 Primary insomnia: Secondary | ICD-10-CM | POA: Diagnosis not present

## 2020-08-16 DIAGNOSIS — M7541 Impingement syndrome of right shoulder: Secondary | ICD-10-CM | POA: Diagnosis not present

## 2020-08-16 DIAGNOSIS — G8929 Other chronic pain: Secondary | ICD-10-CM

## 2020-08-16 DIAGNOSIS — M25511 Pain in right shoulder: Secondary | ICD-10-CM

## 2020-08-16 DIAGNOSIS — Z0001 Encounter for general adult medical examination with abnormal findings: Secondary | ICD-10-CM

## 2020-08-16 MED ORDER — TIZANIDINE HCL 4 MG PO TABS: 4.0000 mg | ORAL_TABLET | Freq: Four times a day (QID) | ORAL | 0 refills | Status: DC | PRN

## 2020-08-16 MED ORDER — ZOLPIDEM TARTRATE 10 MG PO TABS: ORAL_TABLET | ORAL | 2 refills | Status: DC

## 2020-08-16 NOTE — Progress Notes (Signed)
[facility] [place], [place] [address]  Internal MEDICINE  Office Visit Note  Patient Name: Danielle Nash  [id number]  [id number]  Date of Service: 08/17/2020  Chief Complaint  Patient presents with  . Follow-up    toes hurt, runny nose, and her teeth hurt  . Quality Metric Gaps    HepC, TDAP, colonoscopy, dexa    HPI Patient is here for routine follow-up 1. Insomnia-Goes without sleep some nights, on nights she does sleep when taking ambien she sleeps 3-4 hours. When she does sleep she is constantly waking up in the middle of the night and has trouble falling back to sleep. 2. Left shoulder pain-chronic pain due to bone spurs, due to have cortisone injection 3. Caring for her husband, always exhausted 4. Teeth hurt--sinus pressure, has been using Flonase, has been helping 6. Refuses mamogram and colonscopy   Current Medication: Outpatient Encounter Medications as of 08/16/2020  Medication Sig  . B Complex Vitamins (VITAMIN-B COMPLEX) TABS Take 1 tablet by mouth 1 day or 1 dose.  . butalbital-acetaminophen-caffeine (FIORICET) 50-325-40 MG tablet Take 2 tablets by mouth 2 (two) times daily as needed for migraine.  . Calcium Carb-Cholecalciferol (CALCIUM-VITAMIN D) 500-200 MG-UNIT tablet Take 1 tablet by mouth 1 day or 1 dose.  . Cholecalciferol (VITAMIN D) 2000 units tablet Take 4,000 Units by mouth 1 day or 1 dose.  . dicyclomine (BENTYL) 10 MG capsule Take 1 capsule (10 mg total) by mouth 4 (four) times daily -  before meals and at bedtime.  Marland Kitchen MAGNESIUM PO Take 1 tablet by mouth 1 day or 1 dose.  . Multiple Vitamin (MULTI-VITAMINS) TABS Take 1 tablet by mouth 1 day or 1 dose.  . Omega-3 Fatty Acids (FISH OIL PO) Take 1 capsule by mouth 1 day or 1 dose.  . Probiotic Product (PROBIOTIC & ACIDOPHILUS EX ST) CAPS Take 1 tablet by mouth 1 day or 1 dose.  . vitamin B-12 (CYANOCOBALAMIN) 1000 MCG tablet Take 1 tablet by mouth 1 day or 1 dose.  . vitamin E 400  UNIT capsule Take 1 capsule by mouth 1 day or 1 dose.  . zolpidem (AMBIEN) 10 MG tablet Take 1 tablet po QHS prn insomnia  . [DISCONTINUED] tiZANidine (ZANAFLEX) 2 MG tablet Take 1 tablet (2 mg total) by mouth every 6 (six) hours as needed for muscle spasms.  . [DISCONTINUED] zolpidem (AMBIEN) 10 MG tablet Take 1/2 to 1 tablet po QHS prn insomnia  . tiZANidine (ZANAFLEX) 4 MG tablet Take 1 tablet (4 mg total) by mouth every 6 (six) hours as needed for muscle spasms.  . [DISCONTINUED] folic acid (FOLVITE) 572 MCG tablet Take 1 tablet by mouth 1 day or 1 dose. (Patient not taking: Reported on 08/16/2020)   No facility-administered encounter medications on file as of 08/16/2020.    Surgical History: Past Surgical History:  Procedure Laterality Date  . INCISIONAL HERNIA REPAIR N/A 09/05/2017   Procedure: HERNIA REPAIR INCISIONAL;  Surgeon: Vickie Epley, MD;  Location: [facility] ORS;  Service: General;  Laterality: N/A;  . LAPAROSCOPIC APPENDECTOMY N/A 10/19/2016   Procedure: APPENDECTOMY LAPAROSCOPIC;  Surgeon: Dia Crawford III, MD;  Location: [facility] ORS;  Service: General;  Laterality: N/A;  . MEDIAL PARTIAL KNEE REPLACEMENT Bilateral   . THYROID SURGERY      Medical History: Past Medical History:  Diagnosis Date  . Allergy   . Allergy-induced asthma   . Frequent headaches   . Irritable bowel syndrome (IBS)   .  Thyroid disease     Family History: Family History  Problem Relation Age of Onset  . Arthritis Mother   . Diabetes Mother   . Heart disease Father   . Breast cancer Sister   . Diabetes Maternal Uncle     Social History   Socioeconomic History  . Marital status: Married    Spouse name: Not on file  . Number of children: Not on file  . Years of education: Not on file  . Highest education level: Not on file  Occupational History  . Not on file  Tobacco Use  . Smoking status: Never Smoker  . Smokeless tobacco: Never Used  Vaping Use  . Vaping Use: Never used  Substance  and Sexual Activity  . Alcohol use: No    Alcohol/week: 0.0 standard drinks  . Drug use: No  . Sexual activity: Never  Other Topics Concern  . Not on file  Social History Narrative  . Not on file   Social Determinants of Health   Financial Resource Strain:   . Difficulty of Paying Living Expenses: Not on file  Food Insecurity:   . Worried About Charity fundraiser in the Last Year: Not on file  . Ran Out of Food in the Last Year: Not on file  Transportation Needs:   . Lack of Transportation (Medical): Not on file  . Lack of Transportation (Non-Medical): Not on file  Physical Activity:   . Days of Exercise per Week: Not on file  . Minutes of Exercise per Session: Not on file  Stress:   . Feeling of Stress : Not on file  Social Connections:   . Frequency of Communication with Friends and Family: Not on file  . Frequency of Social Gatherings with Friends and Family: Not on file  . Attends Religious Services: Not on file  . Active Member of Clubs or Organizations: Not on file  . Attends Archivist Meetings: Not on file  . Marital Status: Not on file  Intimate Partner Violence:   . Fear of Current or Ex-Partner: Not on file  . Emotionally Abused: Not on file  . Physically Abused: Not on file  . Sexually Abused: Not on file    Review of Systems  Constitutional: Positive for fatigue. Negative for chills and diaphoresis.  HENT: Positive for congestion, sinus pressure and sinus pain. Negative for ear pain and postnasal drip.   Eyes: Negative for photophobia, discharge, redness, itching and visual disturbance.  Respiratory: Negative for cough, shortness of breath and wheezing.   Cardiovascular: Negative for chest pain, palpitations and leg swelling.  Gastrointestinal: Negative for abdominal pain, constipation, diarrhea, nausea and vomiting.  Genitourinary: Negative for dysuria and flank pain.  Musculoskeletal: Negative for arthralgias, back pain, gait problem and neck  pain.  Skin: Negative for color change.  Allergic/Immunologic: Negative for environmental allergies and food allergies.  Neurological: Negative for dizziness and headaches.  Hematological: Does not bruise/bleed easily.  Psychiatric/Behavioral: Negative for agitation, behavioral problems (depression) and hallucinations.    Vital Signs: BP 132/78   Pulse 70   Temp 98 F (36.7 C)   Resp 16   Ht 5' (1.524 m)   Wt 166 lb 3.2 oz (75.4 kg)   SpO2 95%   BMI 32.46 kg/m    Physical Exam Vitals reviewed.  Constitutional:      Appearance: Normal appearance.  HENT:     Head: Normocephalic.     Mouth/Throat:     Mouth: Mucous membranes  are moist.     Pharynx: Oropharynx is clear.  Cardiovascular:     Rate and Rhythm: Normal rate and regular rhythm.     Pulses: Normal pulses.     Heart sounds: Normal heart sounds.  Pulmonary:     Effort: Pulmonary effort is normal.     Breath sounds: Normal breath sounds.  Abdominal:     General: Abdomen is flat.     Palpations: Abdomen is soft.  Musculoskeletal:        General: Normal range of motion.     Cervical back: Normal range of motion.  Skin:    General: Skin is warm.  Neurological:     General: No focal deficit present.     Mental Status: She is alert and oriented to person, place, and time. Mental status is at baseline.  Psychiatric:        Mood and Affect: Mood normal.        Behavior: Behavior normal.        Thought Content: Thought content normal.    Assessment/Plan: 1. Primary insomnia Insomnia has been an ongoing issue. Sleep study, no evidence of OSA. Nocturnal hypoxia determined, was set up for overnight oxygen use. Not wearing oxygen at night as this further disrupts her sleep. She does not take her ambien every night, it allows her to get about 3-4 hours of sleep when she does take it. In depth discussion about cognitive behavioral therapy options to help with her sleep hygiene Discussed taking melatonin 4-5 hours prior  to her scheduled bedtime - zolpidem (AMBIEN) 10 MG tablet; Take 1 tablet po QHS prn insomnia  Dispense: 30 tablet; Refill: 2  2. Chronic right shoulder pain Chronic shoulder pain, followed by orthopaedics. Scheduled to have a cortisone injection in the next week. Continue to follow-up with ortho as needed.  3. Excessive daytime sleepiness Lack of sleep at night as well as being the primary caregiver for her husband who is chronically ill. Discussed potential need for anti-depressant therapy, she is not interested at this time. Encouraged her to reconsider overnight oxygen for nocturnal hypoxia as this could also be causing daytime sleepiness. Will closely monitor.  4. Seasonal allergies Mild congestion as well as sinus pressure Has been using her Flonase and her symptoms are improving. Continue with Flonase at this time and will monitor symptoms.  Annal labs ordered for upcoming CPE 2/14. - CBC w/Diff/Platelet - COMPLETE METABOLIC PANEL WITH GFR - Lipid Panel With LDL/HDL Ratio - TSH + free T4 - Vitamin D 1,25 dihydroxy - B12  General Counseling: Dorris verbalizes understanding of the findings of todays visit and agrees with plan of treatment. I have discussed any further diagnostic evaluation that may be needed or ordered today. We also reviewed her medications today. she has been encouraged to call the office with any questions or concerns that should arise related to todays visit.    Orders Placed This Encounter  Procedures  . CBC w/Diff/Platelet  . COMPLETE METABOLIC PANEL WITH GFR  . Lipid Panel With LDL/HDL Ratio  . TSH + free T4  . Vitamin D 1,25 dihydroxy  . B12    Meds ordered this encounter  Medications  . tiZANidine (ZANAFLEX) 4 MG tablet    Sig: Take 1 tablet (4 mg total) by mouth every 6 (six) hours as needed for muscle spasms.    Dispense:  30 tablet    Refill:  0  . zolpidem (AMBIEN) 10 MG tablet    Sig: Take  1 tablet po QHS prn insomnia    Dispense:  30  tablet    Refill:  2    Please note increase in dose.    Time spent: 30 Minutes   This patient was seen by Theodoro Grist AGNP-C in Collaboration with Dr Lavera Guise as a part of collaborative care agreement     Tanna Furry. Kaisey Huseby AGNP-C Internal medicine

## 2020-08-30 DIAGNOSIS — E756 Lipid storage disorder, unspecified: Secondary | ICD-10-CM | POA: Diagnosis not present

## 2020-08-30 DIAGNOSIS — E559 Vitamin D deficiency, unspecified: Secondary | ICD-10-CM | POA: Diagnosis not present

## 2020-08-30 DIAGNOSIS — D510 Vitamin B12 deficiency anemia due to intrinsic factor deficiency: Secondary | ICD-10-CM | POA: Diagnosis not present

## 2020-08-30 DIAGNOSIS — Z0001 Encounter for general adult medical examination with abnormal findings: Secondary | ICD-10-CM | POA: Diagnosis not present

## 2020-08-30 DIAGNOSIS — E079 Disorder of thyroid, unspecified: Secondary | ICD-10-CM | POA: Diagnosis not present

## 2020-08-30 DIAGNOSIS — R5383 Other fatigue: Secondary | ICD-10-CM | POA: Diagnosis not present

## 2020-09-01 ENCOUNTER — Telehealth: Payer: Self-pay

## 2020-09-01 NOTE — Telephone Encounter (Signed)
LMOM pt needs to make an appointment to discuss labs.

## 2020-09-01 NOTE — Telephone Encounter (Signed)
-----   Message from Luiz Ochoa, NP sent at 08/31/2020  8:23 PM EDT ----- Hi, can we contact patient and get her in for a follow-up to discuss her labs. Please advise her that her lipid panel is abnormal and we need to discuss this. Thanks.

## 2020-09-01 NOTE — Telephone Encounter (Signed)
Spoke with pt, she is aware of her cholesterol being abnormal and will schedule an appt. With T.H.

## 2020-09-06 LAB — VITAMIN D 1,25 DIHYDROXY
Vitamin D 1, 25 (OH)2 Total: 51 pg/mL
Vitamin D2 1, 25 (OH)2: <10 pg/mL
Vitamin D3 1, 25 (OH)2: 51 pg/mL

## 2020-09-06 LAB — CBC WITH DIFFERENTIAL/PLATELET
Basophils Absolute: 0 10*3/uL (ref 0.0–0.2)
Basos: 0 %
EOS (ABSOLUTE): 0.1 10*3/uL (ref 0.0–0.4)
Eos: 1 %
Hematocrit: 45.6 % (ref 34.0–46.6)
Hemoglobin: 14.8 g/dL (ref 11.1–15.9)
Immature Grans (Abs): 0 10*3/uL (ref 0.0–0.1)
Immature Granulocytes: 0 %
Lymphocytes Absolute: 1.2 10*3/uL (ref 0.7–3.1)
Lymphs: 17 %
MCH: 29.5 pg (ref 26.6–33.0)
MCHC: 32.5 g/dL (ref 31.5–35.7)
MCV: 91 fL (ref 79–97)
Monocytes Absolute: 0.6 10*3/uL (ref 0.1–0.9)
Monocytes: 9 %
Neutrophils Absolute: 5.3 10*3/uL (ref 1.4–7.0)
Neutrophils: 73 %
Platelets: 281 10*3/uL (ref 150–450)
RBC: 5.01 x10E6/uL (ref 3.77–5.28)
RDW: 12.7 % (ref 11.7–15.4)
WBC: 7.2 10*3/uL (ref 3.4–10.8)

## 2020-09-06 LAB — LIPID PANEL WITH LDL/HDL RATIO
Cholesterol, Total: 246 mg/dL — ABNORMAL HIGH (ref 100–199)
HDL: 79 mg/dL (ref >39)
LDL Chol Calc (NIH): 150 mg/dL — ABNORMAL HIGH (ref 0–99)
LDL/HDL Ratio: 1.9 ratio (ref 0.0–3.2)
Triglycerides: 100 mg/dL (ref 0–149)
VLDL Cholesterol Cal: 17 mg/dL (ref 5–40)

## 2020-09-06 LAB — VITAMIN B12: Vitamin B-12: 1871 pg/mL — ABNORMAL HIGH (ref 232–1245)

## 2020-09-06 LAB — TSH+FREE T4
Free T4: 1.46 ng/dL (ref 0.82–1.77)
TSH: 0.973 u[IU]/mL (ref 0.450–4.500)

## 2020-09-19 ENCOUNTER — Encounter: Payer: Medicare Other | Admitting: Hospice and Palliative Medicine

## 2020-09-20 ENCOUNTER — Ambulatory Visit (INDEPENDENT_AMBULATORY_CARE_PROVIDER_SITE_OTHER): Payer: Medicare Other | Admitting: Hospice and Palliative Medicine

## 2020-09-20 ENCOUNTER — Other Ambulatory Visit: Payer: Self-pay

## 2020-09-20 ENCOUNTER — Encounter: Payer: Self-pay | Admitting: Hospice and Palliative Medicine

## 2020-09-20 DIAGNOSIS — G4734 Idiopathic sleep related nonobstructive alveolar hypoventilation: Secondary | ICD-10-CM | POA: Diagnosis not present

## 2020-09-20 DIAGNOSIS — F5101 Primary insomnia: Secondary | ICD-10-CM

## 2020-09-20 DIAGNOSIS — J302 Other seasonal allergic rhinitis: Secondary | ICD-10-CM

## 2020-09-20 MED ORDER — BUPROPION HCL 75 MG PO TABS: 75.0000 mg | ORAL_TABLET | Freq: Every day | ORAL | 0 refills | Status: DC

## 2020-09-20 MED ORDER — MIRTAZAPINE 7.5 MG PO TABS: 7.5000 mg | ORAL_TABLET | Freq: Every day | ORAL | 0 refills | Status: DC

## 2020-09-20 NOTE — Progress Notes (Signed)
[facility] [place], [place] [address]  Internal MEDICINE  Office Visit Note  Patient Name: Danielle Nash  [id number]  [id number]  Date of Service: 09/24/2020  Chief Complaint  Patient presents with  . Follow-up    sleep issues  . Hyperlipidemia    HPI Patient is here for routine follow-up Continues to struggle with insomnia--has tried multiple different prescription as well as natural therapies with no success On average she is sleeping 3-4 hours per night, interrupted sleep, cannot turn her brain off, always tired during the day, unable to nap There are some nights that she in unable to get any sleep -Shoulder pain is much improved since cortisone injection -Allergies have also improved, back to her baseline  Sleep study 2020 negative for OSA Has nocturnal hypoxia but does not wear oxygen while sleeping as she says this further disrupts her sleep Has not yet tried melatonin or CBT as discussed at previous visit   Current Medication: Outpatient Encounter Medications as of 09/20/2020  Medication Sig  . B Complex Vitamins (VITAMIN-B COMPLEX) TABS Take 1 tablet by mouth 1 day or 1 dose.  . butalbital-acetaminophen-caffeine (FIORICET) 50-325-40 MG tablet Take 2 tablets by mouth 2 (two) times daily as needed for migraine.  . Calcium Carb-Cholecalciferol (CALCIUM-VITAMIN D) 500-200 MG-UNIT tablet Take 1 tablet by mouth 1 day or 1 dose.  . Cholecalciferol (VITAMIN D) 2000 units tablet Take 4,000 Units by mouth 1 day or 1 dose.  . dicyclomine (BENTYL) 10 MG capsule Take 1 capsule (10 mg total) by mouth 4 (four) times daily -  before meals and at bedtime.  Marland Kitchen MAGNESIUM PO Take 1 tablet by mouth 1 day or 1 dose.  . Probiotic Product (PROBIOTIC & ACIDOPHILUS EX ST) CAPS Take 1 tablet by mouth 1 day or 1 dose.  Marland Kitchen tiZANidine (ZANAFLEX) 4 MG tablet Take 1 tablet (4 mg total) by mouth every 6 (six) hours as needed for muscle spasms.  . vitamin B-12 (CYANOCOBALAMIN)  1000 MCG tablet Take 1 tablet by mouth 1 day or 1 dose.  . vitamin E 400 UNIT capsule Take 1 capsule by mouth 1 day or 1 dose.  Marland Kitchen buPROPion (WELLBUTRIN) 75 MG tablet Take 1 tablet (75 mg total) by mouth daily.  . mirtazapine (REMERON) 7.5 MG tablet Take 1 tablet (7.5 mg total) by mouth at bedtime.  . [DISCONTINUED] Multiple Vitamin (MULTI-VITAMINS) TABS Take 1 tablet by mouth 1 day or 1 dose. (Patient not taking: Reported on 09/20/2020)  . [DISCONTINUED] Omega-3 Fatty Acids (FISH OIL PO) Take 1 capsule by mouth 1 day or 1 dose. (Patient not taking: Reported on 09/20/2020)  . [DISCONTINUED] zolpidem (AMBIEN) 10 MG tablet Take 1 tablet po QHS prn insomnia (Patient not taking: Reported on 09/20/2020)   No facility-administered encounter medications on file as of 09/20/2020.    Surgical History: Past Surgical History:  Procedure Laterality Date  . INCISIONAL HERNIA REPAIR N/A 09/05/2017   Procedure: HERNIA REPAIR INCISIONAL;  Surgeon: Vickie Epley, MD;  Location: [facility] ORS;  Service: General;  Laterality: N/A;  . LAPAROSCOPIC APPENDECTOMY N/A 10/19/2016   Procedure: APPENDECTOMY LAPAROSCOPIC;  Surgeon: Dia Crawford III, MD;  Location: [facility] ORS;  Service: General;  Laterality: N/A;  . MEDIAL PARTIAL KNEE REPLACEMENT Bilateral   . THYROID SURGERY      Medical History: Past Medical History:  Diagnosis Date  . Allergy   . Allergy-induced asthma   . Frequent headaches   . Hyperlipidemia   .  Irritable bowel syndrome (IBS)   . Thyroid disease     Family History: Family History  Problem Relation Age of Onset  . Arthritis Mother   . Diabetes Mother   . Heart disease Father   . Breast cancer Sister   . Diabetes Maternal Uncle     Social History   Socioeconomic History  . Marital status: Married    Spouse name: Not on file  . Number of children: Not on file  . Years of education: Not on file  . Highest education level: Not on file  Occupational History  . Not on file  Tobacco Use  .  Smoking status: Never Smoker  . Smokeless tobacco: Never Used  Vaping Use  . Vaping Use: Never used  Substance and Sexual Activity  . Alcohol use: No    Alcohol/week: 0.0 standard drinks  . Drug use: No  . Sexual activity: Never  Other Topics Concern  . Not on file  Social History Narrative  . Not on file   Social Determinants of Health   Financial Resource Strain:   . Difficulty of Paying Living Expenses: Not on file  Food Insecurity:   . Worried About Charity fundraiser in the Last Year: Not on file  . Ran Out of Food in the Last Year: Not on file  Transportation Needs:   . Lack of Transportation (Medical): Not on file  . Lack of Transportation (Non-Medical): Not on file  Physical Activity:   . Days of Exercise per Week: Not on file  . Minutes of Exercise per Session: Not on file  Stress:   . Feeling of Stress : Not on file  Social Connections:   . Frequency of Communication with Friends and Family: Not on file  . Frequency of Social Gatherings with Friends and Family: Not on file  . Attends Religious Services: Not on file  . Active Member of Clubs or Organizations: Not on file  . Attends Archivist Meetings: Not on file  . Marital Status: Not on file  Intimate Partner Violence:   . Fear of Current or Ex-Partner: Not on file  . Emotionally Abused: Not on file  . Physically Abused: Not on file  . Sexually Abused: Not on file   Review of Systems  Constitutional: Positive for fatigue. Negative for chills and diaphoresis.  HENT: Negative for ear pain, postnasal drip and sinus pressure.   Eyes: Negative for photophobia, discharge, redness, itching and visual disturbance.  Respiratory: Negative for cough, shortness of breath and wheezing.   Cardiovascular: Negative for chest pain, palpitations and leg swelling.  Gastrointestinal: Negative for abdominal pain, constipation, diarrhea, nausea and vomiting.  Genitourinary: Negative for dysuria and flank pain.   Musculoskeletal: Negative for arthralgias, back pain, gait problem and neck pain.  Skin: Negative for color change.  Allergic/Immunologic: Negative for environmental allergies and food allergies.  Neurological: Negative for dizziness and headaches.  Hematological: Does not bruise/bleed easily.  Psychiatric/Behavioral: Positive for sleep disturbance. Negative for agitation, behavioral problems (depression) and hallucinations.    Vital Signs: BP 132/74   Pulse 62   Temp 97.6 F (36.4 C)   Resp 16   Ht 5' (1.524 m)   Wt 167 lb 9.6 oz (76 kg)   SpO2 98%   BMI 32.73 kg/m    Physical Exam Vitals reviewed.  Constitutional:      Appearance: Normal appearance. She is obese.  Cardiovascular:     Rate and Rhythm: Normal rate and regular  rhythm.     Pulses: Normal pulses.     Heart sounds: Normal heart sounds.  Pulmonary:     Effort: Pulmonary effort is normal.     Breath sounds: Normal breath sounds.  Musculoskeletal:        General: Normal range of motion.  Skin:    General: Skin is warm.  Neurological:     General: No focal deficit present.     Mental Status: She is alert and oriented to person, place, and time. Mental status is at baseline.  Psychiatric:        Mood and Affect: Mood normal.        Behavior: Behavior normal.        Thought Content: Thought content normal.     Assessment/Plan: 1. Primary insomnia D/c ambien, start Wellbutrin during the day and remeron at night to assist with sleep Advised to also try Melatonin and CBT as discussed at previous visit - buPROPion (WELLBUTRIN) 75 MG tablet; Take 1 tablet (75 mg total) by mouth daily.  Dispense: 30 tablet; Refill: 0 - mirtazapine (REMERON) 7.5 MG tablet; Take 1 tablet (7.5 mg total) by mouth at bedtime.  Dispense: 30 tablet; Refill: 0  2. Nocturnal oxygen desaturation Discussed the risks associated with not adhering to prescribed oxygen therapy due to her nocturnal hypoxia Continues to feel that this further  complicates her sleep--would like to improve her sleep before trying oxygen therapy again  3. Seasonal allergies Better controlled at this time, continue to monitor   General Counseling: charo philipp understanding of the findings of todays visit and agrees with plan of treatment. I have discussed any further diagnostic evaluation that may be needed or ordered today. We also reviewed her medications today. she has been encouraged to call the office with any questions or concerns that should arise related to todays visit.  PDMP reviewed    Meds ordered this encounter  Medications  . buPROPion (WELLBUTRIN) 75 MG tablet    Sig: Take 1 tablet (75 mg total) by mouth daily.    Dispense:  30 tablet    Refill:  0  . mirtazapine (REMERON) 7.5 MG tablet    Sig: Take 1 tablet (7.5 mg total) by mouth at bedtime.    Dispense:  30 tablet    Refill:  0    Time spent: 30 Minutes Time spent includes review of chart, medications, test results and follow-up plan with the patient.  This patient was seen by Theodoro Grist AGNP-C in Collaboration with Dr Lavera Guise as a part of collaborative care agreement     Tanna Furry. Reiss Mowrey AGNP-C Internal medicine

## 2020-09-24 ENCOUNTER — Encounter: Payer: Self-pay | Admitting: Hospice and Palliative Medicine

## 2020-10-13 DIAGNOSIS — M722 Plantar fascial fibromatosis: Secondary | ICD-10-CM | POA: Diagnosis not present

## 2020-10-13 DIAGNOSIS — M2141 Flat foot [pes planus] (acquired), right foot: Secondary | ICD-10-CM | POA: Diagnosis not present

## 2020-10-13 DIAGNOSIS — M249 Joint derangement, unspecified: Secondary | ICD-10-CM | POA: Diagnosis not present

## 2020-10-13 DIAGNOSIS — M792 Neuralgia and neuritis, unspecified: Secondary | ICD-10-CM | POA: Diagnosis not present

## 2020-10-13 DIAGNOSIS — M216X1 Other acquired deformities of right foot: Secondary | ICD-10-CM | POA: Diagnosis not present

## 2020-10-19 ENCOUNTER — Other Ambulatory Visit: Payer: Self-pay

## 2020-10-19 ENCOUNTER — Ambulatory Visit (INDEPENDENT_AMBULATORY_CARE_PROVIDER_SITE_OTHER): Payer: Medicare Other | Admitting: Internal Medicine

## 2020-10-19 ENCOUNTER — Encounter: Payer: Self-pay | Admitting: Internal Medicine

## 2020-10-19 VITALS — BP 130/76 | HR 75 | Temp 97.8°F | Resp 16 | Ht 60.0 in | Wt 169.2 lb

## 2020-10-19 DIAGNOSIS — I700 Atherosclerosis of aorta: Secondary | ICD-10-CM

## 2020-10-19 DIAGNOSIS — F5101 Primary insomnia: Secondary | ICD-10-CM

## 2020-10-19 DIAGNOSIS — E782 Mixed hyperlipidemia: Secondary | ICD-10-CM

## 2020-10-19 MED ORDER — BUPROPION HCL 75 MG PO TABS: ORAL_TABLET | ORAL | 1 refills | Status: DC

## 2020-10-19 NOTE — Progress Notes (Signed)
[facility] [place], [place] [address]  Internal MEDICINE  Office Visit Note  Patient Name: Danielle Nash  [id number]  [id number]  Date of Service: 10/25/2020  Chief Complaint  Patient presents with  . Follow-up    checking on how meds are working  . Hyperlipidemia  . Asthma  . policy update form    received  . Quality Metric Gaps    colonoscopy, dexa    HPI   Patient is here for routine follow-up Continues to struggle with insomnia--has tried multiple different prescription as well as natural therapies with no success, she was started on Remeron and Wellbutrin, she does not think Remeron worked for her, she was not too sure about Wellbutrin  On average she is sleeping 3-4 hours per night, interrupted sleep, cannot turn her brain off, always tired during the day, unable to nap There are some nights that she in unable to get any sleep Labs reviewed with her , has abnormal lipid profile. CT abdomen and pelvis also showed aortic atherosclerosis, has not had any vascular study done previously   Current Medication: Outpatient Encounter Medications as of 10/19/2020  Medication Sig  . B Complex Vitamins (VITAMIN-B COMPLEX) TABS Take 1 tablet by mouth 1 day or 1 dose.  Marland Kitchen buPROPion (WELLBUTRIN) 75 MG tablet Take one tab po qd with supper  . butalbital-acetaminophen-caffeine (FIORICET) 50-325-40 MG tablet Take 2 tablets by mouth 2 (two) times daily as needed for migraine.  . Calcium Carb-Cholecalciferol (CALCIUM-VITAMIN D) 500-200 MG-UNIT tablet Take 1 tablet by mouth 1 day or 1 dose.  . Cholecalciferol (VITAMIN D) 2000 units tablet Take 4,000 Units by mouth 1 day or 1 dose.  . dicyclomine (BENTYL) 10 MG capsule Take 1 capsule (10 mg total) by mouth 4 (four) times daily -  before meals and at bedtime.  Marland Kitchen MAGNESIUM PO Take 1 tablet by mouth 1 day or 1 dose.  . Probiotic Product (PROBIOTIC & ACIDOPHILUS EX ST) CAPS Take 1 tablet by mouth 1 day or 1 dose.  Marland Kitchen  tiZANidine (ZANAFLEX) 4 MG tablet Take 1 tablet (4 mg total) by mouth every 6 (six) hours as needed for muscle spasms.  . vitamin B-12 (CYANOCOBALAMIN) 1000 MCG tablet Take 1 tablet by mouth 1 day or 1 dose.  . vitamin E 400 UNIT capsule Take 1 capsule by mouth 1 day or 1 dose.  . [DISCONTINUED] buPROPion (WELLBUTRIN) 75 MG tablet Take 1 tablet (75 mg total) by mouth daily.  . [DISCONTINUED] mirtazapine (REMERON) 7.5 MG tablet Take 1 tablet (7.5 mg total) by mouth at bedtime.   No facility-administered encounter medications on file as of 10/19/2020.    Surgical History: Past Surgical History:  Procedure Laterality Date  . INCISIONAL HERNIA REPAIR N/A 09/05/2017   Procedure: HERNIA REPAIR INCISIONAL;  Surgeon: Vickie Epley, MD;  Location: [facility] ORS;  Service: General;  Laterality: N/A;  . LAPAROSCOPIC APPENDECTOMY N/A 10/19/2016   Procedure: APPENDECTOMY LAPAROSCOPIC;  Surgeon: Dia Crawford III, MD;  Location: [facility] ORS;  Service: General;  Laterality: N/A;  . MEDIAL PARTIAL KNEE REPLACEMENT Bilateral   . THYROID SURGERY      Medical History: Past Medical History:  Diagnosis Date  . Allergy   . Allergy-induced asthma   . Frequent headaches   . Hyperlipidemia   . Irritable bowel syndrome (IBS)   . Neuropathy   . Thyroid disease     Family History: Family History  Problem Relation Age of Onset  . Arthritis  Mother   . Diabetes Mother   . Heart disease Father   . Breast cancer Sister   . Diabetes Maternal Uncle     Social History   Socioeconomic History  . Marital status: Married    Spouse name: Not on file  . Number of children: Not on file  . Years of education: Not on file  . Highest education level: Not on file  Occupational History  . Not on file  Tobacco Use  . Smoking status: Never Smoker  . Smokeless tobacco: Never Used  Vaping Use  . Vaping Use: Never used  Substance and Sexual Activity  . Alcohol use: No    Alcohol/week: 0.0 standard drinks  . Drug use:  No  . Sexual activity: Never  Other Topics Concern  . Not on file  Social History Narrative  . Not on file   Social Determinants of Health   Financial Resource Strain:   . Difficulty of Paying Living Expenses: Not on file  Food Insecurity:   . Worried About Charity fundraiser in the Last Year: Not on file  . Ran Out of Food in the Last Year: Not on file  Transportation Needs:   . Lack of Transportation (Medical): Not on file  . Lack of Transportation (Non-Medical): Not on file  Physical Activity:   . Days of Exercise per Week: Not on file  . Minutes of Exercise per Session: Not on file  Stress:   . Feeling of Stress : Not on file  Social Connections:   . Frequency of Communication with Friends and Family: Not on file  . Frequency of Social Gatherings with Friends and Family: Not on file  . Attends Religious Services: Not on file  . Active Member of Clubs or Organizations: Not on file  . Attends Archivist Meetings: Not on file  . Marital Status: Not on file  Intimate Partner Violence:   . Fear of Current or Ex-Partner: Not on file  . Emotionally Abused: Not on file  . Physically Abused: Not on file  . Sexually Abused: Not on file      Review of Systems  Constitutional: Negative for chills, diaphoresis and fatigue.  HENT: Negative for ear pain, postnasal drip and sinus pressure.   Eyes: Negative for photophobia, discharge, redness, itching and visual disturbance.  Respiratory: Negative for cough, shortness of breath and wheezing.   Cardiovascular: Negative for chest pain, palpitations and leg swelling.  Gastrointestinal: Negative for abdominal pain, constipation, diarrhea, nausea and vomiting.  Genitourinary: Negative for dysuria and flank pain.  Musculoskeletal: Negative for arthralgias, back pain, gait problem and neck pain.  Skin: Negative for color change.  Allergic/Immunologic: Negative for environmental allergies and food allergies.  Neurological:  Negative for dizziness and headaches.  Hematological: Does not bruise/bleed easily.  Psychiatric/Behavioral: Negative for agitation, behavioral problems (depression) and hallucinations.    Vital Signs: BP 130/76   Pulse 75   Temp 97.8 F (36.6 C)   Resp 16   Ht 5' (1.524 m)   Wt 169 lb 3.2 oz (76.7 kg)   SpO2 96%   BMI 33.04 kg/m    Physical Exam Constitutional:      General: She is not in acute distress.    Appearance: She is well-developed. She is not diaphoretic.  HENT:     Head: Normocephalic and atraumatic.     Mouth/Throat:     Pharynx: No oropharyngeal exudate.  Eyes:     Pupils: Pupils are equal,  round, and reactive to light.  Neck:     Thyroid: No thyromegaly.     Vascular: No JVD.     Trachea: No tracheal deviation.  Cardiovascular:     Rate and Rhythm: Normal rate and regular rhythm.     Heart sounds: Normal heart sounds. No murmur heard.  No friction rub. No gallop.   Pulmonary:     Effort: Pulmonary effort is normal. No respiratory distress.     Breath sounds: No wheezing or rales.  Chest:     Chest wall: No tenderness.  Abdominal:     General: Bowel sounds are normal.     Palpations: Abdomen is soft.  Musculoskeletal:        General: Normal range of motion.     Cervical back: Normal range of motion and neck supple.  Lymphadenopathy:     Cervical: No cervical adenopathy.  Skin:    General: Skin is warm and dry.  Neurological:     Mental Status: She is alert and oriented to person, place, and time.     Cranial Nerves: No cranial nerve deficit.  Psychiatric:        Behavior: Behavior normal.        Thought Content: Thought content normal.        Judgment: Judgment normal.     Assessment/Plan: 1. Primary insomnia Pt is instructed to take Wellbutrin in the evening since did feel relaxed with it. - buPROPion (WELLBUTRIN) 75 MG tablet; Take one tab po qd with supper  Dispense: 90 tablet; Refill: 1  2. Aortic atherosclerosis ([place]) Will monitor  for now, will need another vascular study  3. Mixed hyperlipidemia Wants to try lowering these levels by life style and dietary changes, will repeat in 6-8 months   General Counseling: Joyce Gross understanding of the findings of todays visit and agrees with plan of treatment. I have discussed any further diagnostic evaluation that may be needed or ordered today. We also reviewed her medications today. she has been encouraged to call the office with any questions or concerns that should arise related to todays visit.    Meds ordered this encounter  Medications  . buPROPion (WELLBUTRIN) 75 MG tablet    Sig: Take one tab po qd with supper    Dispense:  90 tablet    Refill:  1    Total time spent: 35 Minutes Time spent includes review of chart, medications, test results, and follow up plan with the patient.      Dr Lavera Guise Internal medicine

## 2020-11-15 ENCOUNTER — Other Ambulatory Visit: Payer: Self-pay | Admitting: Student

## 2020-11-15 DIAGNOSIS — M19012 Primary osteoarthritis, left shoulder: Secondary | ICD-10-CM | POA: Diagnosis not present

## 2020-11-15 DIAGNOSIS — M19011 Primary osteoarthritis, right shoulder: Secondary | ICD-10-CM | POA: Diagnosis not present

## 2020-11-15 DIAGNOSIS — M7542 Impingement syndrome of left shoulder: Secondary | ICD-10-CM | POA: Diagnosis not present

## 2020-11-15 DIAGNOSIS — M7541 Impingement syndrome of right shoulder: Secondary | ICD-10-CM | POA: Diagnosis not present

## 2020-11-15 DIAGNOSIS — G5601 Carpal tunnel syndrome, right upper limb: Secondary | ICD-10-CM | POA: Diagnosis not present

## 2020-11-16 ENCOUNTER — Ambulatory Visit: Payer: Medicare HMO | Admitting: Internal Medicine

## 2020-11-22 DIAGNOSIS — M79672 Pain in left foot: Secondary | ICD-10-CM | POA: Diagnosis not present

## 2020-11-22 DIAGNOSIS — G479 Sleep disorder, unspecified: Secondary | ICD-10-CM | POA: Diagnosis not present

## 2020-11-22 DIAGNOSIS — G5603 Carpal tunnel syndrome, bilateral upper limbs: Secondary | ICD-10-CM | POA: Diagnosis not present

## 2020-11-22 DIAGNOSIS — M79671 Pain in right foot: Secondary | ICD-10-CM | POA: Diagnosis not present

## 2020-11-22 DIAGNOSIS — G629 Polyneuropathy, unspecified: Secondary | ICD-10-CM | POA: Diagnosis not present

## 2020-11-28 ENCOUNTER — Ambulatory Visit: Payer: Medicare HMO | Admitting: Internal Medicine

## 2021-01-19 DIAGNOSIS — B029 Zoster without complications: Secondary | ICD-10-CM | POA: Diagnosis not present

## 2021-01-22 DIAGNOSIS — B0230 Zoster ocular disease, unspecified: Secondary | ICD-10-CM | POA: Diagnosis not present

## 2021-01-25 ENCOUNTER — Encounter: Payer: Self-pay | Admitting: Physician Assistant

## 2021-01-25 ENCOUNTER — Ambulatory Visit (INDEPENDENT_AMBULATORY_CARE_PROVIDER_SITE_OTHER): Payer: Medicare Other | Admitting: Physician Assistant

## 2021-01-25 ENCOUNTER — Other Ambulatory Visit: Payer: Self-pay

## 2021-01-25 VITALS — BP 139/81 | HR 80 | Temp 97.5°F | Resp 16 | Ht 60.0 in | Wt 171.0 lb

## 2021-01-25 DIAGNOSIS — F5101 Primary insomnia: Secondary | ICD-10-CM

## 2021-01-25 DIAGNOSIS — B029 Zoster without complications: Secondary | ICD-10-CM | POA: Diagnosis not present

## 2021-01-25 DIAGNOSIS — E782 Mixed hyperlipidemia: Secondary | ICD-10-CM | POA: Diagnosis not present

## 2021-01-25 DIAGNOSIS — Z124 Encounter for screening for malignant neoplasm of cervix: Secondary | ICD-10-CM | POA: Diagnosis not present

## 2021-01-25 DIAGNOSIS — Z1211 Encounter for screening for malignant neoplasm of colon: Secondary | ICD-10-CM | POA: Diagnosis not present

## 2021-01-25 DIAGNOSIS — Z13820 Encounter for screening for osteoporosis: Secondary | ICD-10-CM

## 2021-01-25 DIAGNOSIS — R300 Dysuria: Secondary | ICD-10-CM

## 2021-01-25 DIAGNOSIS — Z0001 Encounter for general adult medical examination with abnormal findings: Secondary | ICD-10-CM

## 2021-01-25 DIAGNOSIS — Z6833 Body mass index (BMI) 33.0-33.9, adult: Secondary | ICD-10-CM | POA: Diagnosis not present

## 2021-01-25 DIAGNOSIS — I700 Atherosclerosis of aorta: Secondary | ICD-10-CM

## 2021-01-25 DIAGNOSIS — E2839 Other primary ovarian failure: Secondary | ICD-10-CM

## 2021-01-25 NOTE — Progress Notes (Signed)
[facility] [place], [place] [address]  Internal MEDICINE  Office Visit Note  Patient Name: Danielle Nash  [id number]  [id number]  Date of Service: 01/28/2021  Chief Complaint  Patient presents with   Medicare Wellness   Hyperlipidemia     HPI Pt is here for routine health maintenance examination She currently has shingles on left side of face. This started last week. It is above her L eye. Saw eye dr to make sure it wasn't in her eye and he did not think it was but she does have a f/u. She has been on valtrex and prednisone and will continue for full treatment course. She is not taking Wellbutrin because she says anti-depressants make her more depressed. She saw Dr. Melrose Nakayama, neurologist, for her neuropathy. He put her on Seroquel and gabapentin but she is not taking either. Per pt she did not want to be started on Seroquel because it makes her feel worse and gabapentin does not help her. She is sleeping a little better despite not taking Wellbutrin. Takes Ambien 1-2x per month when she is desperate for sleep. Lets her sleep maybe 4 hours at a time.  Refused mammogram. Will need BMD and colonoscopy which she is open to.  Current Medication: Outpatient Encounter Medications as of 01/25/2021  Medication Sig   B Complex Vitamins (VITAMIN-B COMPLEX) TABS Take 1 tablet by mouth 1 day or 1 dose.   buPROPion (WELLBUTRIN) 75 MG tablet Take one tab po qd with supper   butalbital-acetaminophen-caffeine (FIORICET) 50-325-40 MG tablet Take 2 tablets by mouth 2 (two) times daily as needed for migraine.   Calcium Carb-Cholecalciferol (CALCIUM-VITAMIN D) 500-200 MG-UNIT tablet Take 1 tablet by mouth 1 day or 1 dose.   Cholecalciferol (VITAMIN D) 2000 units tablet Take 4,000 Units by mouth 1 day or 1 dose.   dicyclomine (BENTYL) 10 MG capsule Take 1 capsule (10 mg total) by mouth 4 (four) times daily -  before meals and at bedtime.   MAGNESIUM PO Take 1 tablet by  mouth 1 day or 1 dose.   Probiotic Product (PROBIOTIC & ACIDOPHILUS EX ST) CAPS Take 1 tablet by mouth 1 day or 1 dose.   tiZANidine (ZANAFLEX) 4 MG tablet Take 1 tablet (4 mg total) by mouth every 6 (six) hours as needed for muscle spasms.   vitamin B-12 (CYANOCOBALAMIN) 1000 MCG tablet Take 1 tablet by mouth 1 day or 1 dose.   vitamin E 400 UNIT capsule Take 1 capsule by mouth 1 day or 1 dose.   No facility-administered encounter medications on file as of 01/25/2021.    Surgical History: Past Surgical History:  Procedure Laterality Date   INCISIONAL HERNIA REPAIR N/A 09/05/2017   Procedure: HERNIA REPAIR INCISIONAL;  Surgeon: Vickie Epley, MD;  Location: [facility] ORS;  Service: General;  Laterality: N/A;   LAPAROSCOPIC APPENDECTOMY N/A 10/19/2016   Procedure: APPENDECTOMY LAPAROSCOPIC;  Surgeon: Dia Crawford III, MD;  Location: [facility] ORS;  Service: General;  Laterality: N/A;   MEDIAL PARTIAL KNEE REPLACEMENT Bilateral    THYROID SURGERY      Medical History: Past Medical History:  Diagnosis Date   Allergy    Allergy-induced asthma    Frequent headaches    Hyperlipidemia    Irritable bowel syndrome (IBS)    Neuropathy    Thyroid disease     Family History: Family History  Problem Relation Age of Onset   Arthritis Mother    Diabetes Mother  Heart disease Father    Breast cancer Sister    Diabetes Maternal Uncle       Review of Systems  Constitutional: Negative for chills, fatigue and unexpected weight change.  HENT: Negative for congestion, postnasal drip, rhinorrhea, sneezing and sore throat.        Pain on face, above L eye due to shingles  Eyes: Negative for redness and visual disturbance.  Respiratory: Negative for cough, chest tightness and shortness of breath.   Cardiovascular: Negative for chest pain and palpitations.  Gastrointestinal: Negative for abdominal pain, constipation, diarrhea, nausea and vomiting.  Genitourinary: Negative for  dysuria and frequency.  Musculoskeletal: Negative for arthralgias, back pain, joint swelling and neck pain.  Skin: Positive for rash.       Shingles above L eye  Neurological: Positive for numbness. Negative for tremors.       Known neuropathy in legs/feet  Hematological: Negative for adenopathy. Does not bruise/bleed easily.  Psychiatric/Behavioral: Positive for behavioral problems (Depression) and sleep disturbance. Negative for suicidal ideas. The patient is nervous/anxious.      Vital Signs: BP 139/81    Pulse 80    Temp (!) 97.5 F (36.4 C)    Resp 16    Ht 5' (1.524 m)    Wt 171 lb (77.6 kg)    SpO2 94%    BMI 33.40 kg/m    Physical Exam Constitutional:      General: She is not in acute distress.    Appearance: She is well-developed. She is obese. She is not diaphoretic.  HENT:     Head: Normocephalic and atraumatic.     Right Ear: External ear normal.     Left Ear: External ear normal.     Nose: Nose normal.     Mouth/Throat:     Pharynx: No oropharyngeal exudate.  Eyes:     General: No scleral icterus.       Right eye: No discharge.        Left eye: No discharge.     Conjunctiva/sclera: Conjunctivae normal.     Pupils: Pupils are equal, round, and reactive to light.  Neck:     Thyroid: No thyromegaly.     Vascular: No JVD.     Trachea: No tracheal deviation.  Cardiovascular:     Rate and Rhythm: Normal rate and regular rhythm.     Heart sounds: Normal heart sounds. No murmur heard. No friction rub. No gallop.   Pulmonary:     Effort: Pulmonary effort is normal. No respiratory distress.     Breath sounds: Normal breath sounds. No stridor. No wheezing or rales.  Chest:     Chest wall: No tenderness.  Breasts:     Right: Normal. No mass.     Left: Normal. No mass.    Abdominal:     General: Bowel sounds are normal. There is no distension.     Palpations: Abdomen is soft. There is no mass.     Tenderness: There is no abdominal tenderness. There is no  guarding or rebound.  Musculoskeletal:        General: No tenderness or deformity. Normal range of motion.     Cervical back: Normal range of motion and neck supple.  Lymphadenopathy:     Cervical: No cervical adenopathy.  Skin:    General: Skin is warm and dry.     Coloration: Skin is not pale.     Findings: Erythema and rash present.     Comments:  Crusted Shingles lesions above L eye  Neurological:     Mental Status: She is alert and oriented to person, place, and time.     Cranial Nerves: No cranial nerve deficit.     Motor: No abnormal muscle tone.     Coordination: Coordination normal.     Deep Tendon Reflexes: Reflexes are normal and symmetric.  Psychiatric:        Behavior: Behavior normal.        Thought Content: Thought content normal.        Judgment: Judgment normal.      LABS: Recent Results (from the past 2160 hour(s))  UA/M w/rflx Culture, Routine     Status: Abnormal   Collection Time: 01/25/21  2:17 PM   Specimen: Urine   Urine  Result Value Ref Range   Specific Gravity, UA 1.010 1.005 - 1.030   pH, UA 6.5 5.0 - 7.5   Color, UA Yellow Yellow   Appearance Ur Clear Clear   Leukocytes,UA Trace (A) Negative   Protein,UA Negative Negative/Trace   Glucose, UA Negative Negative   Ketones, UA Negative Negative   RBC, UA Negative Negative   Bilirubin, UA Negative Negative   Urobilinogen, Ur 0.2 0.2 - 1.0 mg/dL   Nitrite, UA Negative Negative   Microscopic Examination See below:     Comment: Microscopic was indicated and was performed.   Urinalysis Reflex Comment     Comment: This specimen has reflexed to a Urine Culture.  Microscopic Examination     Status: Abnormal   Collection Time: 01/25/21  2:17 PM   Urine  Result Value Ref Range   WBC, UA None seen 0 - 5 /hpf   RBC None seen 0 - 2 /hpf   Epithelial Cells (non renal) 0-10 0 - 10 /hpf   Casts None seen None seen /lpf   Bacteria, UA Many (A) None seen/Few  Urine Culture, Reflex     Status: Abnormal    Collection Time: 01/25/21  2:17 PM   Urine  Result Value Ref Range   Urine Culture, Routine CANCELED (A)     Comment: Test not performed. Specimen not received at refrigerated temperature.  Result canceled by the ancillary.        Assessment/Plan: 1. Encounter for general adult medical examination with abnormal findings Pt refused mammogram. Educated on importance of screening. Her BMI is 33. Discussed life style changes. Obesity Counseling: Risk Assessment: An assessment of behavioral risk factors was made today and includes lack of exercise sedentary lifestyle, lack of portion control and poor dietary habits.  Risk Modification Advice: She was counseled on portion control guidelines. Restricting daily caloric intake to 1600 K CAL  . The detrimental long term effects of obesity on her health and ongoing poor compliance was1 also discussed with the patient.  2. Herpes Zoster without complication Continue Valtrex and Prednisone as prescribed. F/u with eye doctor.  3. Primary insomnia Pt will try Wellbutrin again. She was taking it before bed rather than with supper and did not feel it was helping her sleep so she stopped it. She will try taking it with supper consistently. She did ask about restarting Ambien since it lets her sleep about 4 hours at a time. She agreed to try Wellbutrin for now and discuss further after 2 months of consistent use. Also discussed CBTi and Melatonin as previously suggested, which she declined.  4. Aortic atherosclerosis ([place]) Will continue to monitor. Will need another vascular study to follow  5.  Mixed hyperlipidemia Pt is working on lifestyle changes and is resistant to medication. Will repeat lab in 3-5 months, will start Crestor 5 mg 2 X A WEEK   6. Screening for colon cancer - Ambulatory referral to Gastroenterology  7. Other ovarian failure  - DG Bone Density; Future  8. Dysuria - UA/M w/rflx Culture, Routine .  General Counseling: demisha nokes understanding of the findings of todays visit and agrees with plan of treatment. I have discussed any further diagnostic evaluation that may be needed or ordered today. We also reviewed her medications today. she has been encouraged to call the office with any questions or concerns that should arise related to todays visit. Cardiac risk factor modification:  1. Control blood pressure. 2. Exercise as prescribed. 3. Follow low sodium, low fat diet. and low fat and low cholestrol diet. 4. Take ASA 81mg  once a day. 5. Restricted calories diet to lose weight.    Orders Placed This Encounter  Procedures   Microscopic Examination   Urine Culture, Reflex   DG Bone Density   UA/M w/rflx Culture, Routine   Ambulatory referral to Gastroenterology    No orders of the defined types were placed in this encounter.   Total time spent:40 Minutes  Time spent includes review of chart, medications, test results, and follow up plan with the patient.     Lavera Guise, MD  Internal Medicine

## 2021-01-27 LAB — MICROSCOPIC EXAMINATION
Casts: NONE SEEN /lpf
RBC, Urine: NONE SEEN /hpf (ref 0–2)
WBC, UA: NONE SEEN /hpf (ref 0–5)

## 2021-01-27 LAB — UA/M W/RFLX CULTURE, ROUTINE
Bilirubin, UA: NEGATIVE
Glucose, UA: NEGATIVE
Ketones, UA: NEGATIVE
Nitrite, UA: NEGATIVE
Protein,UA: NEGATIVE
RBC, UA: NEGATIVE
Specific Gravity, UA: 1.01 (ref 1.005–1.030)
Urobilinogen, Ur: 0.2 mg/dL (ref 0.2–1.0)
pH, UA: 6.5 (ref 5.0–7.5)

## 2021-01-27 LAB — URINE CULTURE, REFLEX

## 2021-01-29 ENCOUNTER — Ambulatory Visit: Payer: Medicare Other | Admitting: Physician Assistant

## 2021-01-31 ENCOUNTER — Telehealth: Payer: Medicare Other

## 2021-02-01 ENCOUNTER — Other Ambulatory Visit: Payer: Self-pay | Admitting: Physician Assistant

## 2021-02-01 ENCOUNTER — Telehealth: Payer: Self-pay

## 2021-02-01 DIAGNOSIS — R300 Dysuria: Secondary | ICD-10-CM

## 2021-02-01 MED ORDER — NITROFURANTOIN MONOHYD MACRO 100 MG PO CAPS
ORAL_CAPSULE | ORAL | 0 refills | Status: DC
Start: 2021-02-01 — End: 2021-04-11

## 2021-02-01 NOTE — Progress Notes (Signed)
Please let pt know that her urine showed some bacteria, but a new culture needs to be done. I will start her on ABX, but please have her go to [facility] for urine culture/sensitivity

## 2021-02-01 NOTE — Telephone Encounter (Signed)
Pt advised that we send macrobid did showed many bacteria in urine pt like to wait for culture she said we can repeat next follow up and she also don't want colonoscopy for now

## 2021-02-06 ENCOUNTER — Ambulatory Visit: Payer: Medicare Other | Admitting: Internal Medicine

## 2021-02-07 ENCOUNTER — Telehealth: Payer: Self-pay

## 2021-02-07 NOTE — Telephone Encounter (Signed)
Faxed cologuard 

## 2021-02-20 DIAGNOSIS — Z1212 Encounter for screening for malignant neoplasm of rectum: Secondary | ICD-10-CM | POA: Diagnosis not present

## 2021-02-20 DIAGNOSIS — Z1211 Encounter for screening for malignant neoplasm of colon: Secondary | ICD-10-CM | POA: Diagnosis not present

## 2021-02-28 ENCOUNTER — Telehealth: Payer: Self-pay

## 2021-02-28 LAB — EXTERNAL GENERIC LAB PROCEDURE: COLOGUARD: NEGATIVE

## 2021-02-28 LAB — COLOGUARD: COLOGUARD: NEGATIVE

## 2021-02-28 NOTE — Telephone Encounter (Signed)
lmom to call us back Cologuard is negative

## 2021-03-02 DIAGNOSIS — G5603 Carpal tunnel syndrome, bilateral upper limbs: Secondary | ICD-10-CM | POA: Diagnosis not present

## 2021-03-02 DIAGNOSIS — M19011 Primary osteoarthritis, right shoulder: Secondary | ICD-10-CM | POA: Diagnosis not present

## 2021-03-05 LAB — COLOGUARD

## 2021-03-26 ENCOUNTER — Ambulatory Visit: Payer: Medicare Other | Admitting: Physician Assistant

## 2021-04-11 ENCOUNTER — Ambulatory Visit (INDEPENDENT_AMBULATORY_CARE_PROVIDER_SITE_OTHER): Payer: Medicare Other | Admitting: Hospice and Palliative Medicine

## 2021-04-11 ENCOUNTER — Other Ambulatory Visit: Payer: Self-pay

## 2021-04-11 ENCOUNTER — Encounter: Payer: Self-pay | Admitting: Hospice and Palliative Medicine

## 2021-04-11 VITALS — BP 138/88 | HR 71 | Temp 98.6°F | Resp 16 | Ht 60.0 in | Wt 169.4 lb

## 2021-04-11 DIAGNOSIS — E782 Mixed hyperlipidemia: Secondary | ICD-10-CM

## 2021-04-11 DIAGNOSIS — J302 Other seasonal allergic rhinitis: Secondary | ICD-10-CM | POA: Diagnosis not present

## 2021-04-11 DIAGNOSIS — G4734 Idiopathic sleep related nonobstructive alveolar hypoventilation: Secondary | ICD-10-CM

## 2021-04-11 NOTE — Progress Notes (Signed)
[facility] [place], [place] [address]  Internal MEDICINE  Office Visit Note  Patient Name: Danielle Nash  [id number]  [id number]  Date of Service: 04/18/2021  Chief Complaint  Patient presents with  . Follow-up    Insomnia     HPI Patient is here for routine follow-up Sleep has improved since last visit, taking OTC supplements to help with her sleep, averaging most nights about 5-6 hours of sleep Still feels tired during the day and has lack of energy Has been tried on nocturnal supplemental oxygen in the past due to hypoxia but she was unable to tolerate due to the loud noise of the machine Had concentrator in a separate room across from her bedroom and was still troubled with the noise  Complaining of feeling dizzy--started over the weekend, improved Monday, Tuesday and today Has a history of vertigo, has been seen by ENT in the past   Current Medication: Outpatient Encounter Medications as of 04/11/2021  Medication Sig  . B Complex Vitamins (VITAMIN-B COMPLEX) TABS Take 1 tablet by mouth 1 day or 1 dose.  . butalbital-acetaminophen-caffeine (FIORICET) 50-325-40 MG tablet Take 2 tablets by mouth 2 (two) times daily as needed for migraine.  . Calcium Carb-Cholecalciferol (CALCIUM-VITAMIN D) 500-200 MG-UNIT tablet Take 1 tablet by mouth 1 day or 1 dose.  . Cholecalciferol (VITAMIN D) 2000 units tablet Take 4,000 Units by mouth 1 day or 1 dose.  . dicyclomine (BENTYL) 10 MG capsule Take 1 capsule (10 mg total) by mouth 4 (four) times daily -  before meals and at bedtime.  Marland Kitchen MAGNESIUM PO Take 1 tablet by mouth 1 day or 1 dose.  . Probiotic Product (PROBIOTIC & ACIDOPHILUS EX ST) CAPS Take 1 tablet by mouth 1 day or 1 dose.  Marland Kitchen tiZANidine (ZANAFLEX) 4 MG tablet Take 1 tablet (4 mg total) by mouth every 6 (six) hours as needed for muscle spasms.  . vitamin B-12 (CYANOCOBALAMIN) 1000 MCG tablet Take 1 tablet by mouth 1 day or 1 dose.  . vitamin E 400 UNIT  capsule Take 1 capsule by mouth 1 day or 1 dose.  . [DISCONTINUED] buPROPion (WELLBUTRIN) 75 MG tablet Take one tab po qd with supper  . [DISCONTINUED] nitrofurantoin, macrocrystal-monohydrate, (MACROBID) 100 MG capsule Use as instructed   No facility-administered encounter medications on file as of 04/11/2021.    Surgical History: Past Surgical History:  Procedure Laterality Date  . INCISIONAL HERNIA REPAIR N/A 09/05/2017   Procedure: HERNIA REPAIR INCISIONAL;  Surgeon: Vickie Epley, MD;  Location: [facility] ORS;  Service: General;  Laterality: N/A;  . LAPAROSCOPIC APPENDECTOMY N/A 10/19/2016   Procedure: APPENDECTOMY LAPAROSCOPIC;  Surgeon: Dia Crawford III, MD;  Location: [facility] ORS;  Service: General;  Laterality: N/A;  . MEDIAL PARTIAL KNEE REPLACEMENT Bilateral   . THYROID SURGERY      Medical History: Past Medical History:  Diagnosis Date  . Allergy   . Allergy-induced asthma   . Frequent headaches   . Hyperlipidemia   . Irritable bowel syndrome (IBS)   . Neuropathy   . Thyroid disease     Family History: Family History  Problem Relation Age of Onset  . Arthritis Mother   . Diabetes Mother   . Heart disease Father   . Breast cancer Sister   . Diabetes Maternal Uncle     Social History   Socioeconomic History  . Marital status: Married    Spouse name: Not on file  . Number of  children: Not on file  . Years of education: Not on file  . Highest education level: Not on file  Occupational History  . Not on file  Tobacco Use  . Smoking status: Never Smoker  . Smokeless tobacco: Never Used  Vaping Use  . Vaping Use: Never used  Substance and Sexual Activity  . Alcohol use: No    Alcohol/week: 0.0 standard drinks  . Drug use: No  . Sexual activity: Never  Other Topics Concern  . Not on file  Social History Narrative  . Not on file   Social Determinants of Health   Financial Resource Strain: Not on file  Food Insecurity: Not on file  Transportation Needs:  Not on file  Physical Activity: Not on file  Stress: Not on file  Social Connections: Not on file  Intimate Partner Violence: Not on file      Review of Systems  Constitutional: Negative for chills, diaphoresis and fatigue.  HENT: Negative for ear pain, postnasal drip and sinus pressure.   Eyes: Negative for photophobia, discharge, redness, itching and visual disturbance.  Respiratory: Negative for cough, shortness of breath and wheezing.   Cardiovascular: Negative for chest pain, palpitations and leg swelling.  Gastrointestinal: Negative for abdominal pain, constipation, diarrhea, nausea and vomiting.  Genitourinary: Negative for dysuria and flank pain.  Musculoskeletal: Negative for arthralgias, back pain, gait problem and neck pain.  Skin: Negative for color change.  Allergic/Immunologic: Negative for environmental allergies and food allergies.  Neurological: Negative for dizziness and headaches.  Hematological: Does not bruise/bleed easily.  Psychiatric/Behavioral: Negative for agitation, behavioral problems (depression) and hallucinations.    Vital Signs: BP 138/88   Pulse 71   Temp 98.6 F (37 C)   Resp 16   Ht 5' (1.524 m)   Wt 169 lb 6.4 oz (76.8 kg)   SpO2 96%   BMI 33.08 kg/m    Physical Exam Vitals reviewed.  Constitutional:      Appearance: Normal appearance. She is normal weight.  Cardiovascular:     Rate and Rhythm: Normal rate and regular rhythm.     Pulses: Normal pulses.     Heart sounds: Normal heart sounds.  Pulmonary:     Effort: Pulmonary effort is normal.     Breath sounds: Normal breath sounds.  Musculoskeletal:        General: Normal range of motion.     Cervical back: Normal range of motion.  Skin:    General: Skin is warm.  Neurological:     General: No focal deficit present.     Mental Status: She is alert and oriented to person, place, and time. Mental status is at baseline.  Psychiatric:        Mood and Affect: Mood normal.         Behavior: Behavior normal.        Thought Content: Thought content normal.        Judgment: Judgment normal.    Assessment/Plan: 1. Nocturnal hypoxia Will repeat ONO--discussed risks associated with untreated nocturnal hypoxia and possibility of hypoxia contributing to her symptoms of daytime fatigue Willing to retest and willing to discuss concentrator machines that are quiet that will not disrupt her sleep - Pulse oximetry, overnight; Future  2. Mixed hyperlipidemia Again, reluctant to initiating medication therapy, working on improving her lifestyle diet habits and increasing her physical activity levels, repeat labs and next visit  3. Seasonal allergies Symptoms remain well controlled at this time, continue to monitor  General  Counseling: dalina samara understanding of the findings of todays visit and agrees with plan of treatment. I have discussed any further diagnostic evaluation that may be needed or ordered today. We also reviewed her medications today. she has been encouraged to call the office with any questions or concerns that should arise related to todays visit.    Orders Placed This Encounter  Procedures  . Pulse oximetry, overnight    Time spent: 30 Minutes Time spent includes review of chart, medications, test results and follow-up plan with the patient.  This patient was seen by Theodoro Grist AGNP-C in Collaboration with Dr Lavera Guise as a part of collaborative care agreement     Tanna Furry. Clarise Chacko AGNP-C Internal medicine

## 2021-04-18 ENCOUNTER — Encounter: Payer: Self-pay | Admitting: Hospice and Palliative Medicine

## 2021-04-19 DIAGNOSIS — L570 Actinic keratosis: Secondary | ICD-10-CM | POA: Diagnosis not present

## 2021-04-19 DIAGNOSIS — D239 Other benign neoplasm of skin, unspecified: Secondary | ICD-10-CM | POA: Diagnosis not present

## 2021-04-19 DIAGNOSIS — L821 Other seborrheic keratosis: Secondary | ICD-10-CM | POA: Diagnosis not present

## 2021-04-19 DIAGNOSIS — R0902 Hypoxemia: Secondary | ICD-10-CM | POA: Diagnosis not present

## 2021-04-19 DIAGNOSIS — L720 Epidermal cyst: Secondary | ICD-10-CM | POA: Diagnosis not present

## 2021-04-20 ENCOUNTER — Other Ambulatory Visit: Payer: Self-pay | Admitting: Hospice and Palliative Medicine

## 2021-04-20 DIAGNOSIS — G4734 Idiopathic sleep related nonobstructive alveolar hypoventilation: Secondary | ICD-10-CM

## 2021-04-20 NOTE — Progress Notes (Signed)
DME 

## 2021-04-26 ENCOUNTER — Telehealth: Payer: Self-pay

## 2021-04-26 DIAGNOSIS — G4734 Idiopathic sleep related nonobstructive alveolar hypoventilation: Secondary | ICD-10-CM | POA: Diagnosis not present

## 2021-04-26 NOTE — Telephone Encounter (Signed)
Sent sarah a community message to set pt up for night oxygen, she advised that they will get it delivered today as they will be in that area.

## 2021-04-26 NOTE — Telephone Encounter (Signed)
Pt advised that Pulse ox did showed that she qualify for Oxygen and send oxygen order to [facility]

## 2021-05-23 ENCOUNTER — Ambulatory Visit: Payer: Medicare Other | Admitting: Nurse Practitioner

## 2021-05-27 DIAGNOSIS — G4734 Idiopathic sleep related nonobstructive alveolar hypoventilation: Secondary | ICD-10-CM | POA: Diagnosis not present

## 2021-06-26 DIAGNOSIS — G4734 Idiopathic sleep related nonobstructive alveolar hypoventilation: Secondary | ICD-10-CM | POA: Diagnosis not present

## 2021-06-27 DIAGNOSIS — M19012 Primary osteoarthritis, left shoulder: Secondary | ICD-10-CM | POA: Diagnosis not present

## 2021-06-27 DIAGNOSIS — G5601 Carpal tunnel syndrome, right upper limb: Secondary | ICD-10-CM | POA: Diagnosis not present

## 2021-07-19 DIAGNOSIS — Z85828 Personal history of other malignant neoplasm of skin: Secondary | ICD-10-CM | POA: Diagnosis not present

## 2021-07-19 DIAGNOSIS — Z872 Personal history of diseases of the skin and subcutaneous tissue: Secondary | ICD-10-CM | POA: Diagnosis not present

## 2021-07-19 DIAGNOSIS — L02212 Cutaneous abscess of back [any part, except buttock]: Secondary | ICD-10-CM | POA: Diagnosis not present

## 2021-07-19 DIAGNOSIS — L578 Other skin changes due to chronic exposure to nonionizing radiation: Secondary | ICD-10-CM | POA: Diagnosis not present

## 2021-07-19 DIAGNOSIS — L570 Actinic keratosis: Secondary | ICD-10-CM | POA: Diagnosis not present

## 2021-07-27 DIAGNOSIS — G4734 Idiopathic sleep related nonobstructive alveolar hypoventilation: Secondary | ICD-10-CM | POA: Diagnosis not present

## 2021-08-27 DIAGNOSIS — G4734 Idiopathic sleep related nonobstructive alveolar hypoventilation: Secondary | ICD-10-CM | POA: Diagnosis not present

## 2021-09-07 DIAGNOSIS — R059 Cough, unspecified: Secondary | ICD-10-CM | POA: Diagnosis not present

## 2021-09-13 NOTE — Progress Notes (Signed)
New Patient Office Visit  Subjective:  Patient ID: Danielle Nash, female    DOB: 1951-03-19  Age: 70 y.o. MRN: [id number]  CC:  Chief Complaint  Patient presents with   New Patient (Initial Visit)     HPI ZOI DEVINE presents for new patient visit to establish care.  Introduced to Designer, [employer]ell[employer] role and practice setting.  All questions answered.  Discussed provider/patient relationship and expectations.  Endorses chronic fatigue. She has trouble sleeping, she has a hard time falling asleep and wakes up frequently. She has been trying to do sleep studies, but she can't sleep when attached to the equipment. She did have an overnight oximetry and showed desaturation. She wears oxygen at night. She has tried [place], zquil, and quetiapine. She said that she can't take antidepressants because they make her symptoms worse.   She also has a history of migraines. She takes fioricet for these and states that one bottle usually lasts her a year. Headaches have not been as frequent since having thyroid surgery.   Depression screen [facility] 2/9 09/14/2021 01/25/2021 10/19/2020 09/20/2020 08/16/2020  Decreased Interest 3 0 0 0 0  Down, Depressed, Hopeless 1 0 0 0 0  PHQ - 2 Score 4 0 0 0 0  Altered sleeping 3 - - - -  Tired, decreased energy 3 - - - -  Change in appetite 1 - - - -  Feeling bad or failure about yourself  0 - - - -  Trouble concentrating 0 - - - -  Moving slowly or fidgety/restless 0 - - - -  Suicidal thoughts 0 - - - -  PHQ-9 Score 11 - - - -  Difficult doing work/chores Somewhat difficult - - - -     Past Medical History:  Diagnosis Date   Acute appendicitis 03/11/2016   Allergy    Allergy-induced asthma    Frequent headaches    Hypercalcemia 03/18/2019   Hyperlipidemia    Irritable bowel syndrome (IBS)    Neuropathy    Thyroid disease     Past Surgical History:  Procedure Laterality Date   INCISIONAL HERNIA REPAIR N/A 09/05/2017   Procedure: HERNIA REPAIR  INCISIONAL;  Surgeon: Vickie Epley, MD;  Location: [facility] ORS;  Service: General;  Laterality: N/A;   LAPAROSCOPIC APPENDECTOMY N/A 10/19/2016   Procedure: APPENDECTOMY LAPAROSCOPIC;  Surgeon: Dia Crawford III, MD;  Location: [facility] OR[facility];  Service: General;  Laterality: N/A;   MEDIAL PARTIAL KNEE REPLACEMENT Bilateral    THYROID SURGERY      Family History  Problem Relation Age of Onset   Arthritis Mother    Diabetes Mother    Heart disease Father    Breast cancer Sister    Diabetes Maternal Uncle     Social History   Socioeconomic History   Marital status: Married    Spouse name: Not on file   Number of children: Not on file   Years of education: Not on file   Highest education level: Not on file  Occupational History   Not on file  Tobacco Use   Smoking status: Never   Smokeless tobacco: Never  Vaping Use   Vaping Use: Never used  Substance and Sexual Activity   Alcohol use: No    Alcohol/week: 0.0 standard drinks   Drug use: No   Sexual activity: Never  Other Topics Concern   Not on file  Social History Narrative   Not on file   Social Determinants of Health  Financial Resource Strain: Not on file  Food Insecurity: Not on file  Transportation Needs: Not on file  Physical Activity: Not on file  Stress: Not on file  Social Connections: Not on file  Intimate Partner Violence: Not on file    ROS Review of Systems  Constitutional:  Positive for fatigue.  HENT: Negative.    Eyes: Negative.   Respiratory:  Positive for cough (recent covid-19).   Cardiovascular: Negative.   Gastrointestinal:  Positive for nausea. Negative for abdominal pain, constipation and diarrhea.  Genitourinary:  Positive for frequency. Negative for dysuria and vaginal discharge.  Musculoskeletal:  Positive for back pain (chronic, stable).  Skin:        Dry, itchy skin to back. Dermatologist had given her some triamcinolone cream  Neurological:  Positive for headaches (chronic, stable).   Psychiatric/Behavioral:  Positive for sleep disturbance. The patient is not nervous/anxious.    Objective:   Today's Vitals: BP 119/79   Pulse 78   Temp 98.8 F (37.1 C) (Oral)   Ht 5' (1.524 m)   Wt 163 lb (73.9 kg)   SpO2 95%   BMI 31.83 kg/m   Physical Exam Vitals and nursing note reviewed.  Constitutional:      General: She is not in acute distress.    Appearance: Normal appearance.  HENT:     Head: Normocephalic.  Eyes:     Conjunctiva/sclera: Conjunctivae normal.  Cardiovascular:     Rate and Rhythm: Normal rate and regular rhythm.     Pulses: Normal pulses.     Heart sounds: Normal heart sounds.  Pulmonary:     Effort: Pulmonary effort is normal.     Breath sounds: Normal breath sounds.  Abdominal:     Palpations: Abdomen is soft.     Tenderness: There is no abdominal tenderness. There is no right CVA tenderness or left CVA tenderness.  Musculoskeletal:     Cervical back: Normal range of motion.  Skin:    General: Skin is warm and dry.  Neurological:     General: No focal deficit present.     Mental Status: She is alert and oriented to person, place, and time.  Psychiatric:        Mood and Affect: Mood normal.        Behavior: Behavior normal.        Thought Content: Thought content normal.        Judgment: Judgment normal.    Assessment & Plan:   Problem List Items Addressed This Visit       Cardiovascular and Mediastinum   Migraine    Chronic, has lessened in frequency since her thyroid surgery. She was taking fioricet, however the last bottle didn't seem to help as much as it had in the past. She has tried immitrex but not maxalt. Will send in a prescription for maxalt. Follow up with concerns.       Relevant Medications   rizatriptan (MAXALT) 10 MG tablet     Respiratory   Hypoxia, sleep related    Currently wears oxygen at night. Follows with pulmonology. Continue collaboration and recommendations.         Endocrine   Thyroid disease     Will check TSH with ongoing fatigue.      Primary hyperparathyroidism ([place])    Stable Had parathyroid and partial thyroidectomy. Check TSH  And CMP today        Nervous and Auditory   Lumbar radiculitis    Chronic, stable.  Continue tizanidine prn. F/U with any concerns.       Relevant Medications   hydrOXYzine (VISTARIL) 25 MG capsule     Other   Chronic insomnia    Chronic, ongoing. Has not been able to tolerate a sleep study in the past. Has tried [place], zyquil, and quetiapine. Will send in hydroxyzine prn. Discussed sleep hygiene. Follow up in 6-8 weeks.       Other Visit Diagnoses     Fatigue, unspecified type    -  Primary   Chronic, ongoing. Not sleeping well and PHQ9 elevated which may be causing symtpoms. Refuses antidepressant. Check CMP, CBC, TSH today   Relevant Orders   Comprehensive metabolic panel   TSH   CBC with Differential/Platelet   Vaginal odor       Had recent UTI. Will check U/A and wet prep and treat based on results   Relevant Orders   Urinalysis, Routine w reflex microscopic   WET PREP FOR [place], YEAST, CLUE   Encounter for lipid screening for cardiovascular disease       Check lipid panel today   Relevant Orders   Lipid Panel w/o Chol/HDL Ratio   Encounter to establish care           Outpatient Encounter Medications as of 09/14/2021  Medication Sig   B Complex Vitamins (VITAMIN-B COMPLEX) TABS Take 1 tablet by mouth 1 day or 1 dose.   butalbital-acetaminophen-caffeine (FIORICET) 50-325-40 MG tablet Take 2 tablets by mouth 2 (two) times daily as needed for migraine.   Cholecalciferol (VITAMIN D) 2000 units tablet Take 4,000 Units by mouth 1 day or 1 dose.   dicyclomine (BENTYL) 10 MG capsule Take 1 capsule (10 mg total) by mouth 4 (four) times daily -  before meals and at bedtime.   hydrOXYzine (VISTARIL) 25 MG capsule Take 1 capsule (25 mg total) by mouth every 8 (eight) hours as needed for itching (sleep).   MAGNESIUM PO Take 1 tablet by  mouth 1 day or 1 dose.   Probiotic Product (PROBIOTIC & ACIDOPHILUS EX ST) CAPS Take 1 tablet by mouth 1 day or 1 dose.   rizatriptan (MAXALT) 10 MG tablet Take 1 tablet (10 mg total) by mouth as needed for migraine. May repeat in 2 hours if needed   tiZANidine (ZANAFLEX) 4 MG tablet Take 1 tablet (4 mg total) by mouth every 6 (six) hours as needed for muscle spasms.   triamcinolone cream (KENALOG) 0.1 % Apply 1 application topically 2 (two) times daily.   vitamin B-12 (CYANOCOBALAMIN) 1000 MCG tablet Take 1 tablet by mouth 1 day or 1 dose.   vitamin E 400 UNIT capsule Take 1 capsule by mouth 1 day or 1 dose.   [DISCONTINUED] Calcium Carb-Cholecalciferol (CALCIUM-VITAMIN D) 500-200 MG-UNIT tablet Take 1 tablet by mouth 1 day or 1 dose.   No facility-administered encounter medications on file as of 09/14/2021.    Follow-up: Return in about 6 weeks (around 10/26/2021) for fatigue.   Charyl Dancer, NP

## 2021-09-14 ENCOUNTER — Encounter: Payer: Self-pay | Admitting: Nurse Practitioner

## 2021-09-14 ENCOUNTER — Ambulatory Visit (INDEPENDENT_AMBULATORY_CARE_PROVIDER_SITE_OTHER): Payer: Medicare Other | Admitting: Nurse Practitioner

## 2021-09-14 ENCOUNTER — Other Ambulatory Visit: Payer: Self-pay

## 2021-09-14 VITALS — BP 119/79 | HR 78 | Temp 98.8°F | Ht 60.0 in | Wt 163.0 lb

## 2021-09-14 DIAGNOSIS — G43809 Other migraine, not intractable, without status migrainosus: Secondary | ICD-10-CM

## 2021-09-14 DIAGNOSIS — Z136 Encounter for screening for cardiovascular disorders: Secondary | ICD-10-CM | POA: Diagnosis not present

## 2021-09-14 DIAGNOSIS — N898 Other specified noninflammatory disorders of vagina: Secondary | ICD-10-CM

## 2021-09-14 DIAGNOSIS — Z7689 Persons encountering health services in other specified circumstances: Secondary | ICD-10-CM | POA: Diagnosis not present

## 2021-09-14 DIAGNOSIS — Z13220 Encounter for screening for lipoid disorders: Secondary | ICD-10-CM

## 2021-09-14 DIAGNOSIS — E210 Primary hyperparathyroidism: Secondary | ICD-10-CM

## 2021-09-14 DIAGNOSIS — E079 Disorder of thyroid, unspecified: Secondary | ICD-10-CM | POA: Diagnosis not present

## 2021-09-14 DIAGNOSIS — M5416 Radiculopathy, lumbar region: Secondary | ICD-10-CM | POA: Diagnosis not present

## 2021-09-14 DIAGNOSIS — R5383 Other fatigue: Secondary | ICD-10-CM | POA: Diagnosis not present

## 2021-09-14 DIAGNOSIS — G4734 Idiopathic sleep related nonobstructive alveolar hypoventilation: Secondary | ICD-10-CM

## 2021-09-14 DIAGNOSIS — F5104 Psychophysiologic insomnia: Secondary | ICD-10-CM

## 2021-09-14 LAB — URINALYSIS, ROUTINE W REFLEX MICROSCOPIC
Bilirubin, UA: NEGATIVE
Glucose, UA: NEGATIVE
Ketones, UA: NEGATIVE
Leukocytes,UA: NEGATIVE
Nitrite, UA: NEGATIVE
Protein,UA: NEGATIVE
RBC, UA: NEGATIVE
Specific Gravity, UA: 1.01 (ref 1.005–1.030)
Urobilinogen, Ur: 0.2 mg/dL (ref 0.2–1.0)
pH, UA: 6.5 (ref 5.0–7.5)

## 2021-09-14 LAB — WET PREP FOR TRICH, YEAST, CLUE
Clue Cell Exam: NEGATIVE
Trichomonas Exam: NEGATIVE
Yeast Exam: NEGATIVE

## 2021-09-14 MED ORDER — TRIAMCINOLONE ACETONIDE 0.1 % EX CREA: 1.0000 "application " | TOPICAL_CREAM | Freq: Two times a day (BID) | CUTANEOUS | 0 refills | Status: DC

## 2021-09-14 MED ORDER — RIZATRIPTAN BENZOATE 10 MG PO TABS: 10.0000 mg | ORAL_TABLET | ORAL | 1 refills | Status: DC | PRN

## 2021-09-14 MED ORDER — HYDROXYZINE PAMOATE 25 MG PO CAPS: 25.0000 mg | ORAL_CAPSULE | Freq: Three times a day (TID) | ORAL | 0 refills | Status: DC | PRN

## 2021-09-14 NOTE — Assessment & Plan Note (Signed)
Will check TSH with ongoing fatigue.

## 2021-09-14 NOTE — Assessment & Plan Note (Signed)
Chronic, ongoing. Has not been able to tolerate a sleep study in the past. Has tried [place], zyquil, and quetiapine. Will send in hydroxyzine prn. Discussed sleep hygiene. Follow up in 6-8 weeks.

## 2021-09-14 NOTE — Assessment & Plan Note (Signed)
Chronic, stable. Continue tizanidine prn. F/U with any concerns.

## 2021-09-14 NOTE — Assessment & Plan Note (Signed)
Currently wears oxygen at night. Follows with pulmonology. Continue collaboration and recommendations.

## 2021-09-14 NOTE — Assessment & Plan Note (Signed)
Stable Had parathyroid and partial thyroidectomy. Check TSH  And CMP today

## 2021-09-14 NOTE — Assessment & Plan Note (Signed)
Chronic, has lessened in frequency since her thyroid surgery. She was taking fioricet, however the last bottle didn't seem to help as much as it had in the past. She has tried immitrex but not maxalt. Will send in a prescription for maxalt. Follow up with concerns.

## 2021-09-14 NOTE — Patient Instructions (Signed)
Biotene mouth wash for dry mouth

## 2021-09-15 LAB — COMPREHENSIVE METABOLIC PANEL
ALT: 19 IU/L (ref 0–32)
AST: 27 IU/L (ref 0–40)
Albumin/Globulin Ratio: 2 (ref 1.2–2.2)
Albumin: 4.3 g/dL (ref 3.7–4.7)
Alkaline Phosphatase: 100 IU/L (ref 44–121)
BUN/Creatinine Ratio: 10 — ABNORMAL LOW (ref 12–28)
BUN: 7 mg/dL — ABNORMAL LOW (ref 8–27)
Bilirubin Total: 0.3 mg/dL (ref 0.0–1.2)
CO2: 21 mmol/L (ref 20–29)
Calcium: 9.1 mg/dL (ref 8.7–10.3)
Chloride: 98 mmol/L (ref 96–106)
Creatinine, Ser: 0.68 mg/dL (ref 0.57–1.00)
Globulin, Total: 2.1 g/dL (ref 1.5–4.5)
Glucose: 89 mg/dL (ref 70–99)
Potassium: 3.9 mmol/L (ref 3.5–5.2)
Sodium: 137 mmol/L (ref 134–144)
Total Protein: 6.4 g/dL (ref 6.0–8.5)
eGFR: 92 mL/min/{1.73_m2} (ref >59)

## 2021-09-15 LAB — CBC WITH DIFFERENTIAL/PLATELET
Basophils Absolute: 0 10*3/uL (ref 0.0–0.2)
Basos: 1 %
EOS (ABSOLUTE): 0 10*3/uL (ref 0.0–0.4)
Eos: 0 %
Hematocrit: 42.7 % (ref 34.0–46.6)
Hemoglobin: 14.6 g/dL (ref 11.1–15.9)
Immature Grans (Abs): 0 10*3/uL (ref 0.0–0.1)
Immature Granulocytes: 1 %
Lymphocytes Absolute: 0.9 10*3/uL (ref 0.7–3.1)
Lymphs: 25 %
MCH: 29.7 pg (ref 26.6–33.0)
MCHC: 34.2 g/dL (ref 31.5–35.7)
MCV: 87 fL (ref 79–97)
Monocytes Absolute: 0.5 10*3/uL (ref 0.1–0.9)
Monocytes: 12 %
Neutrophils Absolute: 2.3 10*3/uL (ref 1.4–7.0)
Neutrophils: 61 %
Platelets: 161 10*3/uL (ref 150–450)
RBC: 4.92 x10E6/uL (ref 3.77–5.28)
RDW: 12.3 % (ref 11.7–15.4)
WBC: 3.8 10*3/uL (ref 3.4–10.8)

## 2021-09-15 LAB — TSH: TSH: 0.693 u[IU]/mL (ref 0.450–4.500)

## 2021-09-15 LAB — LIPID PANEL W/O CHOL/HDL RATIO
Cholesterol, Total: 159 mg/dL (ref 100–199)
HDL: 52 mg/dL (ref >39)
LDL Chol Calc (NIH): 90 mg/dL (ref 0–99)
Triglycerides: 93 mg/dL (ref 0–149)
VLDL Cholesterol Cal: 17 mg/dL (ref 5–40)

## 2021-09-26 DIAGNOSIS — G4734 Idiopathic sleep related nonobstructive alveolar hypoventilation: Secondary | ICD-10-CM | POA: Diagnosis not present

## 2021-10-10 DIAGNOSIS — G5601 Carpal tunnel syndrome, right upper limb: Secondary | ICD-10-CM | POA: Diagnosis not present

## 2021-10-15 DIAGNOSIS — M9903 Segmental and somatic dysfunction of lumbar region: Secondary | ICD-10-CM | POA: Diagnosis not present

## 2021-10-15 DIAGNOSIS — M546 Pain in thoracic spine: Secondary | ICD-10-CM | POA: Diagnosis not present

## 2021-10-15 DIAGNOSIS — M9902 Segmental and somatic dysfunction of thoracic region: Secondary | ICD-10-CM | POA: Diagnosis not present

## 2021-10-15 DIAGNOSIS — M530 Cervicocranial syndrome: Secondary | ICD-10-CM | POA: Diagnosis not present

## 2021-10-15 DIAGNOSIS — M5136 Other intervertebral disc degeneration, lumbar region: Secondary | ICD-10-CM | POA: Diagnosis not present

## 2021-10-15 DIAGNOSIS — M9901 Segmental and somatic dysfunction of cervical region: Secondary | ICD-10-CM | POA: Diagnosis not present

## 2021-10-17 NOTE — Progress Notes (Signed)
Established Patient Office Visit  Subjective:  Patient ID: Danielle Nash, female    DOB: [birth year]  Age: 70 y.o. MRN: [id number]  CC:  Chief Complaint  Patient presents with   Insomnia   Jaw Pain     HPI [name] presents for follow up on insomnia and jaw pain. She states that she has taking the hydroxyzine at night which helped her sleep for a few nights, and then it stopped working. She would still like to take it intermittently as it has helped some. She still wears her oxygen at night which helps her relax. She endorses fatigue and sleepiness during the day. She was not able to tolerate a sleep study in the past.   She also has noticed right sided jaw pain for the last several days. It is an 8/10 and is worse with chewing. She describes it as aching and sore. She has not taken anything to help with pain. She has rinsed her mouth out with peroxide which helped, but was really sore in the gum near her pain. She denies fevers. She goes to the dentist regularly, next visit in December.   Past Medical History:  Diagnosis Date   Acute appendicitis 03/11/2016   Allergy    Allergy-induced asthma    Frequent headaches    Hypercalcemia 03/18/2019   Hyperlipidemia    Irritable bowel syndrome (IBS)    Neuropathy    Thyroid disease     Past Surgical History:  Procedure Laterality Date   INCISIONAL HERNIA REPAIR N/A 09/05/2017   Procedure: HERNIA REPAIR INCISIONAL;  Surgeon: Vickie Epley, MD;  Location: [facility];  Service: General;  Laterality: N/A;   LAPAROSCOPIC APPENDECTOMY N/A 10/19/2016   Procedure: APPENDECTOMY LAPAROSCOPIC;  Surgeon: Dia Crawford III, MD;  Location: [facility] OR[facility];  Service: General;  Laterality: N/A;   MEDIAL PARTIAL KNEE REPLACEMENT Bilateral    THYROID SURGERY      Family History  Problem Relation Age of Onset   Arthritis Mother    Diabetes Mother    Heart disease Father    Breast cancer Sister    Diabetes Maternal Uncle     Social History    Socioeconomic History   Marital status: Married    Spouse name: Not on file   Number of children: Not on file   Years of education: Not on file   Highest education level: Not on file  Occupational History   Not on file  Tobacco Use   Smoking status: Never   Smokeless tobacco: Never  Vaping Use   Vaping Use: Never used  Substance and Sexual Activity   Alcohol use: No    Alcohol/week: 0.0 standard drinks   Drug use: No   Sexual activity: Never  Other Topics Concern   Not on file  Social History Narrative   Not on file   Social Determinants of Health   Financial Resource Strain: Not on file  Food Insecurity: Not on file  Transportation Needs: Not on file  Physical Activity: Not on file  Stress: Not on file  Social Connections: Not on file  Intimate Partner Violence: Not on file    Outpatient Medications Prior to Visit  Medication Sig Dispense Refill   B Complex Vitamins (VITAMIN-B COMPLEX) TABS Take 1 tablet by mouth 1 day or 1 dose.     butalbital-acetaminophen-caffeine (FIORICET) 50-325-40 MG tablet Take 2 tablets by mouth 2 (two) times daily as needed for migraine. 30 tablet 1   Cholecalciferol (VITAMIN  D) 2000 units tablet Take 4,000 Units by mouth 1 day or 1 dose.     dicyclomine (BENTYL) 10 MG capsule Take 1 capsule (10 mg total) by mouth 4 (four) times daily -  before meals and at bedtime. 90 capsule 1   MAGNESIUM PO Take 1 tablet by mouth 1 day or 1 dose.     Probiotic Product (PROBIOTIC & ACIDOPHILUS EX ST) CAPS Take 1 tablet by mouth 1 day or 1 dose.     tiZANidine (ZANAFLEX) 4 MG tablet Take 1 tablet (4 mg total) by mouth every 6 (six) hours as needed for muscle spasms. 30 tablet 0   triamcinolone cream (KENALOG) 0.1 % Apply 1 application topically 2 (two) times daily. 30 g 0   vitamin B-12 (CYANOCOBALAMIN) 1000 MCG tablet Take 1 tablet by mouth 1 day or 1 dose.     vitamin E 400 UNIT capsule Take 1 capsule by mouth 1 day or 1 dose.     hydrOXYzine (VISTARIL)  25 MG capsule Take 1 capsule (25 mg total) by mouth every 8 (eight) hours as needed for itching (sleep). 30 capsule 0   rizatriptan (MAXALT) 10 MG tablet Take 1 tablet (10 mg total) by mouth as needed for migraine. May repeat in 2 hours if needed 10 tablet 1   No facility-administered medications prior to visit.    Allergies  Allergen Reactions   Levofloxacin Other (See Comments)    Caused severe joint pain.    Azithromycin Other (See Comments)    Severe pain   Erythromycin Other (See Comments)    Severe pain   Oxycodone Itching and Other (See Comments)    depression   Sulfa Antibiotics Other (See Comments)    Other reaction(s): Unknown    ROS Review of Systems  Constitutional:  Positive for fatigue.  HENT:         Right jaw pain  Respiratory:  Positive for cough. Negative for shortness of breath.   Cardiovascular: Negative.   Gastrointestinal: Negative.   Genitourinary: Negative.   Musculoskeletal:  Positive for back pain.  Skin: Negative.   Neurological:  Positive for headaches.  Psychiatric/Behavioral:  Positive for sleep disturbance.      Objective:    Physical Exam Vitals and nursing note reviewed.  Constitutional:      General: She is not in acute distress.    Appearance: Normal appearance.  HENT:     Head: Normocephalic and atraumatic.     Right Ear: Tympanic membrane, ear canal and external ear normal.     Left Ear: Tympanic membrane, ear canal and external ear normal.     Mouth/Throat:     Comments: Redness and swelling to right upper gumline near back molar Eyes:     Conjunctiva/sclera: Conjunctivae normal.  Cardiovascular:     Rate and Rhythm: Normal rate and regular rhythm.     Pulses: Normal pulses.     Heart sounds: Normal heart sounds.  Pulmonary:     Effort: Pulmonary effort is normal.     Breath sounds: Normal breath sounds.  Musculoskeletal:     Cervical back: Normal range of motion.  Skin:    General: Skin is warm and dry.  Neurological:      General: No focal deficit present.     Mental Status: She is alert and oriented to person, place, and time.  Psychiatric:        Mood and Affect: Mood normal.        Behavior: Behavior  normal.        Thought Content: Thought content normal.        Judgment: Judgment normal.    BP 136/86 (BP Location: Left Arm, Patient Position: Sitting)   Pulse 71   Temp 98.5 F (36.9 C) (Oral)   Resp 18   Wt 165 lb 3.2 oz (74.9 kg)   SpO2 95%   BMI 32.26 kg/m  Wt Readings from Last 3 Encounters:  10/19/21 165 lb 3.2 oz (74.9 kg)  09/14/21 163 lb (73.9 kg)  04/11/21 169 lb 6.4 oz (76.8 kg)     Health Maintenance Due  Topic Date Due   COVID-19 Vaccine (1) Never done   Hepatitis C Screening  Never done   Zoster Vaccines- Shingrix (1 of 2) Never done   DEXA SCAN  Never done   Pneumonia Vaccine 30+ Years old (2 - PCV) 10/22/2015    There are no preventive care reminders to display for this patient.  Lab Results  Component Value Date   TSH 0.693 09/14/2021   Lab Results  Component Value Date   WBC 3.8 09/14/2021   HGB 14.6 09/14/2021   HCT 42.7 09/14/2021   MCV 87 09/14/2021   PLT 161 09/14/2021   Lab Results  Component Value Date   NA 137 09/14/2021   K 3.9 09/14/2021   CO2 21 09/14/2021   GLUCOSE 89 09/14/2021   BUN 7 (L) 09/14/2021   CREATININE 0.68 09/14/2021   BILITOT 0.3 09/14/2021   ALKPHOS 100 09/14/2021   AST 27 09/14/2021   ALT 19 09/14/2021   PROT 6.4 09/14/2021   ALBUMIN 4.3 09/14/2021   CALCIUM 9.1 09/14/2021   ANIONGAP 5 09/06/2017   EGFR 92 09/14/2021   Lab Results  Component Value Date   CHOL 159 09/14/2021   Lab Results  Component Value Date   HDL 52 09/14/2021   Lab Results  Component Value Date   LDLCALC 90 09/14/2021   Lab Results  Component Value Date   TRIG 93 09/14/2021   No results found for: CHOLHDL No results found for: HGBA1C    Assessment & Plan:   Problem List Items Addressed This Visit       Cardiovascular and  Mediastinum   Migraine    Chronic. Has taken maxalt with resolution of migraine x1. She states that she is not getting her migraines as frequently since having thyroid surgery. Follow up in 3 months.       Relevant Medications   rizatriptan (MAXALT) 10 MG tablet     Other   Chronic insomnia - Primary    Chronic, ongoing. Had some success with the hydroxyzine for a few days, however it is not helping as much anymore. She still would like to take it as needed. She has tried restoril, trazadone, ambien, zquil, and seroquel in the past. Hydroxyzine refill sent to the pharmacy.       Other Visit Diagnoses     Jaw pain       TMJ vs abscess. Will treat with augmentin x7days. Follow up with dentist and see if can get appointment before December. F/U for worsening symptoms.        Meds ordered this encounter  Medications   DISCONTD: amoxicillin-clavulanate (AUGMENTIN) 875-125 MG tablet    Sig: Take 1 tablet by mouth 2 (two) times daily.    Dispense:  14 tablet    Refill:  0   amoxicillin-clavulanate (AUGMENTIN) 875-125 MG tablet    Sig: Take 1 tablet  by mouth 2 (two) times daily.    Dispense:  14 tablet    Refill:  0   hydrOXYzine (VISTARIL) 25 MG capsule    Sig: Take 1 capsule (25 mg total) by mouth every 8 (eight) hours as needed for itching (sleep).    Dispense:  30 capsule    Refill:  3   rizatriptan (MAXALT) 10 MG tablet    Sig: Take 1 tablet (10 mg total) by mouth as needed for migraine. May repeat in 2 hours if needed    Dispense:  10 tablet    Refill:  1     Follow-up: Return in about 3 months (around 01/19/2022) for migraines, fatigue with Dr. Neomia Dear.    Charyl Dancer, NP

## 2021-10-19 ENCOUNTER — Other Ambulatory Visit: Payer: Self-pay

## 2021-10-19 ENCOUNTER — Ambulatory Visit (INDEPENDENT_AMBULATORY_CARE_PROVIDER_SITE_OTHER): Payer: Medicare Other | Admitting: Nurse Practitioner

## 2021-10-19 ENCOUNTER — Encounter: Payer: Self-pay | Admitting: Nurse Practitioner

## 2021-10-19 VITALS — BP 136/86 | HR 71 | Temp 98.5°F | Resp 18 | Wt 165.2 lb

## 2021-10-19 DIAGNOSIS — G43919 Migraine, unspecified, intractable, without status migrainosus: Secondary | ICD-10-CM

## 2021-10-19 DIAGNOSIS — F5104 Psychophysiologic insomnia: Secondary | ICD-10-CM | POA: Diagnosis not present

## 2021-10-19 DIAGNOSIS — R6884 Jaw pain: Secondary | ICD-10-CM

## 2021-10-19 MED ORDER — RIZATRIPTAN BENZOATE 10 MG PO TABS: 10.0000 mg | ORAL_TABLET | ORAL | 1 refills | Status: DC | PRN

## 2021-10-19 MED ORDER — AMOXICILLIN-POT CLAVULANATE 875-125 MG PO TABS: 1.0000 | ORAL_TABLET | Freq: Two times a day (BID) | ORAL | 0 refills | Status: DC

## 2021-10-19 MED ORDER — HYDROXYZINE PAMOATE 25 MG PO CAPS
25.0000 mg | ORAL_CAPSULE | Freq: Three times a day (TID) | ORAL | 3 refills | Status: DC | PRN
Start: 2021-10-19 — End: 2022-04-25

## 2021-10-19 NOTE — Patient Instructions (Signed)
Follow up with your dentist for the jaw pain

## 2021-10-19 NOTE — Assessment & Plan Note (Signed)
Chronic. Has taken maxalt with resolution of migraine x1. She states that she is not getting her migraines as frequently since having thyroid surgery. Follow up in 3 months.

## 2021-10-19 NOTE — Assessment & Plan Note (Signed)
Chronic, ongoing. Had some success with the hydroxyzine for a few days, however it is not helping as much anymore. She still would like to take it as needed. She has tried restoril, trazadone, ambien, zquil, and seroquel in the past. Hydroxyzine refill sent to the pharmacy.

## 2021-10-24 DIAGNOSIS — M19012 Primary osteoarthritis, left shoulder: Secondary | ICD-10-CM | POA: Diagnosis not present

## 2021-10-24 DIAGNOSIS — M9902 Segmental and somatic dysfunction of thoracic region: Secondary | ICD-10-CM | POA: Diagnosis not present

## 2021-10-24 DIAGNOSIS — M9903 Segmental and somatic dysfunction of lumbar region: Secondary | ICD-10-CM | POA: Diagnosis not present

## 2021-10-24 DIAGNOSIS — M19011 Primary osteoarthritis, right shoulder: Secondary | ICD-10-CM | POA: Diagnosis not present

## 2021-10-24 DIAGNOSIS — M530 Cervicocranial syndrome: Secondary | ICD-10-CM | POA: Diagnosis not present

## 2021-10-24 DIAGNOSIS — M5136 Other intervertebral disc degeneration, lumbar region: Secondary | ICD-10-CM | POA: Diagnosis not present

## 2021-10-24 DIAGNOSIS — M546 Pain in thoracic spine: Secondary | ICD-10-CM | POA: Diagnosis not present

## 2021-10-24 DIAGNOSIS — M9901 Segmental and somatic dysfunction of cervical region: Secondary | ICD-10-CM | POA: Diagnosis not present

## 2021-10-27 DIAGNOSIS — G4734 Idiopathic sleep related nonobstructive alveolar hypoventilation: Secondary | ICD-10-CM | POA: Diagnosis not present

## 2021-11-07 DIAGNOSIS — M530 Cervicocranial syndrome: Secondary | ICD-10-CM | POA: Diagnosis not present

## 2021-11-07 DIAGNOSIS — M9903 Segmental and somatic dysfunction of lumbar region: Secondary | ICD-10-CM | POA: Diagnosis not present

## 2021-11-07 DIAGNOSIS — M5136 Other intervertebral disc degeneration, lumbar region: Secondary | ICD-10-CM | POA: Diagnosis not present

## 2021-11-07 DIAGNOSIS — M546 Pain in thoracic spine: Secondary | ICD-10-CM | POA: Diagnosis not present

## 2021-11-07 DIAGNOSIS — M9901 Segmental and somatic dysfunction of cervical region: Secondary | ICD-10-CM | POA: Diagnosis not present

## 2021-11-07 DIAGNOSIS — M9902 Segmental and somatic dysfunction of thoracic region: Secondary | ICD-10-CM | POA: Diagnosis not present

## 2021-11-26 DIAGNOSIS — G4734 Idiopathic sleep related nonobstructive alveolar hypoventilation: Secondary | ICD-10-CM | POA: Diagnosis not present

## 2021-12-04 DIAGNOSIS — M546 Pain in thoracic spine: Secondary | ICD-10-CM | POA: Diagnosis not present

## 2021-12-04 DIAGNOSIS — M9902 Segmental and somatic dysfunction of thoracic region: Secondary | ICD-10-CM | POA: Diagnosis not present

## 2021-12-04 DIAGNOSIS — M9903 Segmental and somatic dysfunction of lumbar region: Secondary | ICD-10-CM | POA: Diagnosis not present

## 2021-12-04 DIAGNOSIS — M5136 Other intervertebral disc degeneration, lumbar region: Secondary | ICD-10-CM | POA: Diagnosis not present

## 2021-12-04 DIAGNOSIS — M9901 Segmental and somatic dysfunction of cervical region: Secondary | ICD-10-CM | POA: Diagnosis not present

## 2021-12-04 DIAGNOSIS — M530 Cervicocranial syndrome: Secondary | ICD-10-CM | POA: Diagnosis not present

## 2021-12-27 DIAGNOSIS — G4734 Idiopathic sleep related nonobstructive alveolar hypoventilation: Secondary | ICD-10-CM | POA: Diagnosis not present

## 2022-01-02 DIAGNOSIS — M216X2 Other acquired deformities of left foot: Secondary | ICD-10-CM | POA: Diagnosis not present

## 2022-01-02 DIAGNOSIS — M792 Neuralgia and neuritis, unspecified: Secondary | ICD-10-CM | POA: Diagnosis not present

## 2022-01-02 DIAGNOSIS — M19072 Primary osteoarthritis, left ankle and foot: Secondary | ICD-10-CM | POA: Diagnosis not present

## 2022-01-02 DIAGNOSIS — L603 Nail dystrophy: Secondary | ICD-10-CM | POA: Diagnosis not present

## 2022-01-02 DIAGNOSIS — M2142 Flat foot [pes planus] (acquired), left foot: Secondary | ICD-10-CM | POA: Diagnosis not present

## 2022-01-02 DIAGNOSIS — M249 Joint derangement, unspecified: Secondary | ICD-10-CM | POA: Diagnosis not present

## 2022-01-02 DIAGNOSIS — M2141 Flat foot [pes planus] (acquired), right foot: Secondary | ICD-10-CM | POA: Diagnosis not present

## 2022-01-02 DIAGNOSIS — B351 Tinea unguium: Secondary | ICD-10-CM | POA: Diagnosis not present

## 2022-01-02 DIAGNOSIS — M216X1 Other acquired deformities of right foot: Secondary | ICD-10-CM | POA: Diagnosis not present

## 2022-01-16 DIAGNOSIS — M530 Cervicocranial syndrome: Secondary | ICD-10-CM | POA: Diagnosis not present

## 2022-01-16 DIAGNOSIS — M546 Pain in thoracic spine: Secondary | ICD-10-CM | POA: Diagnosis not present

## 2022-01-16 DIAGNOSIS — M9902 Segmental and somatic dysfunction of thoracic region: Secondary | ICD-10-CM | POA: Diagnosis not present

## 2022-01-16 DIAGNOSIS — M9901 Segmental and somatic dysfunction of cervical region: Secondary | ICD-10-CM | POA: Diagnosis not present

## 2022-01-16 DIAGNOSIS — M5136 Other intervertebral disc degeneration, lumbar region: Secondary | ICD-10-CM | POA: Diagnosis not present

## 2022-01-16 DIAGNOSIS — M9903 Segmental and somatic dysfunction of lumbar region: Secondary | ICD-10-CM | POA: Diagnosis not present

## 2022-01-23 ENCOUNTER — Ambulatory Visit (INDEPENDENT_AMBULATORY_CARE_PROVIDER_SITE_OTHER): Payer: Medicare Other | Admitting: Internal Medicine

## 2022-01-23 ENCOUNTER — Telehealth: Payer: Self-pay

## 2022-01-23 ENCOUNTER — Encounter: Payer: Self-pay | Admitting: Internal Medicine

## 2022-01-23 ENCOUNTER — Telehealth: Payer: Self-pay | Admitting: Internal Medicine

## 2022-01-23 ENCOUNTER — Other Ambulatory Visit: Payer: Self-pay

## 2022-01-23 VITALS — BP 127/85 | HR 58 | Temp 98.2°F | Ht 60.0 in | Wt 168.0 lb

## 2022-01-23 DIAGNOSIS — E039 Hypothyroidism, unspecified: Secondary | ICD-10-CM

## 2022-01-23 DIAGNOSIS — J329 Chronic sinusitis, unspecified: Secondary | ICD-10-CM

## 2022-01-23 DIAGNOSIS — R8290 Unspecified abnormal findings in urine: Secondary | ICD-10-CM | POA: Diagnosis not present

## 2022-01-23 LAB — URINALYSIS, ROUTINE W REFLEX MICROSCOPIC
Bilirubin, UA: NEGATIVE
Glucose, UA: NEGATIVE
Ketones, UA: NEGATIVE
Leukocytes,UA: NEGATIVE
Nitrite, UA: NEGATIVE
Protein,UA: NEGATIVE
RBC, UA: NEGATIVE
Specific Gravity, UA: 1.01 (ref 1.005–1.030)
Urobilinogen, Ur: 0.2 mg/dL (ref 0.2–1.0)
pH, UA: 7 (ref 5.0–7.5)

## 2022-01-23 LAB — WET PREP FOR TRICH, YEAST, CLUE
Clue Cell Exam: NEGATIVE
Trichomonas Exam: NEGATIVE
Yeast Exam: NEGATIVE

## 2022-01-23 MED ORDER — FEXOFENADINE HCL 180 MG PO TABS: 180.0000 mg | ORAL_TABLET | Freq: Every day | ORAL | 1 refills | Status: DC

## 2022-01-23 MED ORDER — LIDOCAINE 5 % EX PTCH: 1.0000 | MEDICATED_PATCH | CUTANEOUS | 0 refills | Status: DC

## 2022-01-23 MED ORDER — MOMETASONE FUROATE 50 MCG/ACT NA SUSP: 2.0000 | Freq: Every day | NASAL | 12 refills | Status: DC

## 2022-01-23 NOTE — Telephone Encounter (Signed)
Received PA from pharmacy, has been started awaiting on determination

## 2022-01-23 NOTE — Telephone Encounter (Signed)
PA for Lidocaine patches has been approved.

## 2022-01-23 NOTE — Telephone Encounter (Signed)
Patient called in states med Lidocaine patch, was denied and pharmacy is putting in for prior auth.

## 2022-01-23 NOTE — Progress Notes (Signed)
BP 127/85    Pulse (!) 58    Temp 98.2 F (36.8 C) (Oral)    Ht 5' (1.524 m)    Wt 168 lb (76.2 kg)    SpO2 96%    BMI 32.81 kg/m    Subjective:    Patient ID: Danielle Nash, female    DOB: [birth year], 71 y.o.   MRN: [id number]  Chief Complaint  Patient presents with   Migraine   Fatigue   Insomnia   foul smelling urine    For the past month, has taken Augmetin    HPI: Danielle Nash is a 71 y.o. female  Vertigo intermittently has had this in the problem. Says she has had a thyroidtectimy 17 yrs ago has had insomnia chronic and feels this is worsening since thyroid surgery.  Migraine  This is a chronic (not as severe as they used to be) problem. Associated symptoms include insomnia.  Insomnia Primary symptoms: no fragmented sleep, no sleep disturbance, no difficulty falling asleep, no somnolence, no frequent awakening, no premature morning awakening, no malaise/fatigue.    Sinus Problem This is a chronic (no purulent d/c no fever or chills has clear phlegm and psot nasal drip) problem.   Chief Complaint  Patient presents with   Migraine   Fatigue   Insomnia   foul smelling urine    For the past month, has taken Augmetin    Relevant past medical, surgical, family and social history reviewed and updated as indicated. Interim medical history since our last visit reviewed. Allergies and medications reviewed and updated.  Review of Systems  Constitutional:  Negative for malaise/fatigue.  Psychiatric/Behavioral:  Negative for sleep disturbance. The patient has insomnia.    Per HPI unless specifically indicated above     Objective:    BP 127/85    Pulse (!) 58    Temp 98.2 F (36.8 C) (Oral)    Ht 5' (1.524 m)    Wt 168 lb (76.2 kg)    SpO2 96%    BMI 32.81 kg/m   Wt Readings from Last 3 Encounters:  01/23/22 168 lb (76.2 kg)  10/19/21 165 lb 3.2 oz (74.9 kg)  09/14/21 163 lb (73.9 kg)    Physical Exam Vitals and nursing note reviewed.  Constitutional:       General: She is not in acute distress.    Appearance: Normal appearance. She is not ill-appearing or diaphoretic.  Eyes:     Conjunctiva/sclera: Conjunctivae normal.  Cardiovascular:     Rate and Rhythm: Normal rate and regular rhythm.  Pulmonary:     Breath sounds: No rhonchi.  Abdominal:     General: Abdomen is flat. Bowel sounds are normal. There is no distension.     Palpations: Abdomen is soft. There is no mass.     Tenderness: There is no abdominal tenderness. There is no guarding.  Skin:    General: Skin is warm and dry.     Coloration: Skin is not jaundiced.     Findings: No erythema.  Neurological:     Mental Status: She is alert.     Cranial Nerves: No cranial nerve deficit.     Sensory: No sensory deficit.    Results for orders placed or performed in visit on 01/23/22  WET PREP FOR [place], YEAST, CLUE   Urine  Result Value Ref Range   Trichomonas Exam Negative Negative   Yeast Exam Negative Negative   Clue Cell Exam Negative Negative  Urinalysis,  Routine w reflex microscopic  Result Value Ref Range   Specific Gravity, UA 1.010 1.005 - 1.030   pH, UA 7.0 5.0 - 7.5   Color, UA Yellow Yellow   Appearance Ur Clear Clear   Leukocytes,UA Negative Negative   Protein,UA Negative Negative/Trace   Glucose, UA Negative Negative   Ketones, UA Negative Negative   RBC, UA Negative Negative   Bilirubin, UA Negative Negative   Urobilinogen, Ur 0.2 0.2 - 1.0 mg/dL   Nitrite, UA Negative Negative        Current Outpatient Medications:    B Complex Vitamins (VITAMIN-B COMPLEX) TABS, Take 1 tablet by mouth 1 day or 1 dose., Disp: , Rfl:    Cholecalciferol (VITAMIN D) 2000 units tablet, Take 4,000 Units by mouth 1 day or 1 dose., Disp: , Rfl:    dicyclomine (BENTYL) 10 MG capsule, Take 1 capsule (10 mg total) by mouth 4 (four) times daily -  before meals and at bedtime., Disp: 90 capsule, Rfl: 1   fexofenadine (ALLEGRA ALLERGY) 180 MG tablet, Take 1 tablet (180 mg total) by  mouth daily., Disp: 10 tablet, Rfl: 1   hydrOXYzine (VISTARIL) 25 MG capsule, Take 1 capsule (25 mg total) by mouth every 8 (eight) hours as needed for itching (sleep)., Disp: 30 capsule, Rfl: 3   lidocaine (LIDODERM) 5 %, Place 1 patch onto the skin daily. Remove & Discard patch within 12 hours or as directed by MD, Disp: 30 patch, Rfl: 0   MAGNESIUM PO, Take 1 tablet by mouth 1 day or 1 dose., Disp: , Rfl:    mometasone (NASONEX) 50 MCG/ACT nasal spray, Place 2 sprays into the nose daily., Disp: 1 each, Rfl: 12   Probiotic Product (PROBIOTIC & ACIDOPHILUS EX ST) CAPS, Take 1 tablet by mouth 1 day or 1 dose., Disp: , Rfl:    rizatriptan (MAXALT) 10 MG tablet, Take 1 tablet (10 mg total) by mouth as needed for migraine. May repeat in 2 hours if needed, Disp: 10 tablet, Rfl: 1   tiZANidine (ZANAFLEX) 4 MG tablet, Take 1 tablet (4 mg total) by mouth every 6 (six) hours as needed for muscle spasms., Disp: 30 tablet, Rfl: 0   triamcinolone cream (KENALOG) 0.1 %, Apply 1 application topically 2 (two) times daily., Disp: 30 g, Rfl: 0   vitamin B-12 (CYANOCOBALAMIN) 1000 MCG tablet, Take 1 tablet by mouth 1 day or 1 dose., Disp: , Rfl:    vitamin E 400 UNIT capsule, Take 1 capsule by mouth 1 day or 1 dose., Disp: , Rfl:     Assessment & Plan:  UTI  check UA.  pt is currently symptomatic for an Urinary tract infection(abd pain, burning etc), will cover with emperic abx, see med module for details.  encouraged to increase water/fluid intake.Signs and symptoms of emergency were discussed with the patient. The risks, benefits and side effects of treatment were discussed with the patient. The patient verbalized an understanding of plan, and was told to call the clinic/go to the ED if symptoms worsen at any point of time.  2. Back pain chronic will send off lidoderm patches for such   3. Hyperparathyroidism? S/p  partial thyroidectomy and parathyroidectomy for such  Will refer to endocrinology for such   4.  Sinusitis wil lneed to start pt on nasonex for such and allegra   Problem List Items Addressed This Visit   None Visit Diagnoses     Foul smelling urine    -  Primary  Relevant Orders   Urinalysis, Routine w reflex microscopic (Completed)   Urine Culture   WET PREP FOR [place]ICH, YEAST, CLUE   CBC with Differential/Platelet   Comprehensive metabolic panel   TSH   Urinalysis, Routine w reflex microscopic   Lipid panel   Ambulatory referral to Endocrinology   T4, free   Hypothyroidism, unspecified type       Relevant Orders   TSH        Orders Placed This Encounter  Procedures   Urine Culture   WET PREP FOR [place], YEAST, CLUE   WET PREP FOR [place], YEAST, CLUE   Urinalysis, Routine w reflex microscopic   CBC with Differential/Platelet   Comprehensive metabolic panel   TSH   Urinalysis, Routine w reflex microscopic   Lipid panel   T4, free   Ambulatory referral to Endocrinology     Meds ordered this encounter  Medications   fexofenadine (ALLEGRA ALLERGY) 180 MG tablet    Sig: Take 1 tablet (180 mg total) by mouth daily.    Dispense:  10 tablet    Refill:  1   mometasone (NASONEX) 50 MCG/ACT nasal spray    Sig: Place 2 sprays into the nose daily.    Dispense:  1 each    Refill:  12   lidocaine (LIDODERM) 5 %    Sig: Place 1 patch onto the skin daily. Remove & Discard patch within 12 hours or as directed by MD    Dispense:  30 patch    Refill:  0     Follow up plan: Return in about 6 months (around 07/23/2022).

## 2022-01-24 LAB — COMPREHENSIVE METABOLIC PANEL
ALT: 19 IU/L (ref 0–32)
AST: 26 IU/L (ref 0–40)
Albumin/Globulin Ratio: 2.3 — ABNORMAL HIGH (ref 1.2–2.2)
Albumin: 4.9 g/dL — ABNORMAL HIGH (ref 3.7–4.7)
Alkaline Phosphatase: 102 IU/L (ref 44–121)
BUN/Creatinine Ratio: 16 (ref 12–28)
BUN: 12 mg/dL (ref 8–27)
Bilirubin Total: 0.5 mg/dL (ref 0.0–1.2)
CO2: 25 mmol/L (ref 20–29)
Calcium: 10.3 mg/dL (ref 8.7–10.3)
Chloride: 100 mmol/L (ref 96–106)
Creatinine, Ser: 0.73 mg/dL (ref 0.57–1.00)
Globulin, Total: 2.1 g/dL (ref 1.5–4.5)
Glucose: 86 mg/dL (ref 70–99)
Potassium: 4.3 mmol/L (ref 3.5–5.2)
Sodium: 140 mmol/L (ref 134–144)
Total Protein: 7 g/dL (ref 6.0–8.5)
eGFR: 87 mL/min/{1.73_m2} (ref >59)

## 2022-01-24 LAB — CBC WITH DIFFERENTIAL/PLATELET
Basophils Absolute: 0 10*3/uL (ref 0.0–0.2)
Basos: 1 %
EOS (ABSOLUTE): 0.1 10*3/uL (ref 0.0–0.4)
Eos: 2 %
Hematocrit: 45.6 % (ref 34.0–46.6)
Hemoglobin: 14.9 g/dL (ref 11.1–15.9)
Immature Grans (Abs): 0 10*3/uL (ref 0.0–0.1)
Immature Granulocytes: 0 %
Lymphocytes Absolute: 1.4 10*3/uL (ref 0.7–3.1)
Lymphs: 27 %
MCH: 29.4 pg (ref 26.6–33.0)
MCHC: 32.7 g/dL (ref 31.5–35.7)
MCV: 90 fL (ref 79–97)
Monocytes Absolute: 0.6 10*3/uL (ref 0.1–0.9)
Monocytes: 13 %
Neutrophils Absolute: 2.9 10*3/uL (ref 1.4–7.0)
Neutrophils: 57 %
Platelets: 256 10*3/uL (ref 150–450)
RBC: 5.06 x10E6/uL (ref 3.77–5.28)
RDW: 12.4 % (ref 11.7–15.4)
WBC: 5.1 10*3/uL (ref 3.4–10.8)

## 2022-01-24 LAB — LIPID PANEL
Chol/HDL Ratio: 2.9 ratio (ref 0.0–4.4)
Cholesterol, Total: 232 mg/dL — ABNORMAL HIGH (ref 100–199)
HDL: 79 mg/dL (ref >39)
LDL Chol Calc (NIH): 142 mg/dL — ABNORMAL HIGH (ref 0–99)
Triglycerides: 65 mg/dL (ref 0–149)
VLDL Cholesterol Cal: 11 mg/dL (ref 5–40)

## 2022-01-24 LAB — TSH: TSH: 1.17 u[IU]/mL (ref 0.450–4.500)

## 2022-01-24 LAB — T4, FREE: Free T4: 1.58 ng/dL (ref 0.82–1.77)

## 2022-01-25 LAB — URINE CULTURE: Organism ID, Bacteria: NO GROWTH

## 2022-01-27 DIAGNOSIS — G4734 Idiopathic sleep related nonobstructive alveolar hypoventilation: Secondary | ICD-10-CM | POA: Diagnosis not present

## 2022-01-28 ENCOUNTER — Ambulatory Visit: Payer: Self-pay

## 2022-01-28 ENCOUNTER — Ambulatory Visit: Payer: Medicare Other | Admitting: Physician Assistant

## 2022-01-28 NOTE — Telephone Encounter (Signed)
Patient called, left VM to return the call to the office to speak to a nurse.   Summary: Nosebleed,right ear discomfort.   Pt stated on Sunday at church she blew her nose and suddenly people around her started to tell her that she had blood all over her face. Pt stated at an earlier visit she was given Alegra and she feels this is what is causing it stated her stomach has been irritated as well.   Pt stated still experiencing some right ear discomfort.   No appointment for today.

## 2022-01-28 NOTE — Telephone Encounter (Signed)
3rd attempt to contact patient to review symptoms of nose bleed after blowing and abdominal pain. No answer, LVMTCB. [phone].

## 2022-01-28 NOTE — Telephone Encounter (Signed)
Second attempt to contact patient- no answer- left message to call office

## 2022-01-30 ENCOUNTER — Encounter: Payer: Self-pay | Admitting: Internal Medicine

## 2022-01-30 ENCOUNTER — Ambulatory Visit (INDEPENDENT_AMBULATORY_CARE_PROVIDER_SITE_OTHER): Payer: Medicare Other | Admitting: *Deleted

## 2022-01-30 DIAGNOSIS — Z0000 Encounter for general adult medical examination without abnormal findings: Secondary | ICD-10-CM

## 2022-01-30 NOTE — Progress Notes (Signed)
Subjective:   STEFANEE MCKELL is a 71 y.o. female who presents for Medicare Annual (Subsequent) preventive examination.  I connected with  Arna Snipe on 01/30/22 by a telephone enabled telemedicine application and verified that I am speaking with the correct person using two identifiers.   I discussed the limitations of evaluation and management by telemedicine. The patient expressed understanding and agreed to proceed.  Patient location: home  Provider location:   Tele-Health not in office  Review of Systems     Cardiac Risk Factors include: advanced age (>37men, >20 women)     Objective:    Today's Vitals   01/30/22 0826  PainSc: 4    There is no height or weight on file to calculate BMI.  Advanced Directives 01/30/2022 09/05/2017 09/05/2017 09/05/2017 10/18/2016 03/11/2016  Does Patient Have a Medical Advance Directive? No No No No No No  Would patient like information on creating a medical advance directive? No - Patient declined - Yes (Inpatient - patient requests chaplain consult to create a medical advance directive) No - Patient declined No - patient declined information No - patient declined information    Current Medications (verified) Outpatient Encounter Medications as of 01/30/2022  Medication Sig   B Complex Vitamins (VITAMIN-B COMPLEX) TABS Take 1 tablet by mouth 1 day or 1 dose.   Cholecalciferol (VITAMIN D) 2000 units tablet Take 4,000 Units by mouth 1 day or 1 dose.   dicyclomine (BENTYL) 10 MG capsule Take 1 capsule (10 mg total) by mouth 4 (four) times daily -  before meals and at bedtime.   fluticasone (FLONASE) 50 MCG/ACT nasal spray Place into both nostrils daily.   hydrOXYzine (VISTARIL) 25 MG capsule Take 1 capsule (25 mg total) by mouth every 8 (eight) hours as needed for itching (sleep).   lidocaine (LIDODERM) 5 % Place 1 patch onto the skin daily. Remove & Discard patch within 12 hours or as directed by MD   MAGNESIUM PO Take 1 tablet by mouth 1 day  or 1 dose.   mometasone (NASONEX) 50 MCG/ACT nasal spray Place 2 sprays into the nose daily.   Probiotic Product (PROBIOTIC & ACIDOPHILUS EX ST) CAPS Take 1 tablet by mouth 1 day or 1 dose.   rizatriptan (MAXALT) 10 MG tablet Take 1 tablet (10 mg total) by mouth as needed for migraine. May repeat in 2 hours if needed   tiZANidine (ZANAFLEX) 4 MG tablet Take 1 tablet (4 mg total) by mouth every 6 (six) hours as needed for muscle spasms.   triamcinolone cream (KENALOG) 0.1 % Apply 1 application topically 2 (two) times daily.   vitamin B-12 (CYANOCOBALAMIN) 1000 MCG tablet Take 1 tablet by mouth 1 day or 1 dose.   vitamin E 400 UNIT capsule Take 1 capsule by mouth 1 day or 1 dose.   fexofenadine (ALLEGRA ALLERGY) 180 MG tablet Take 1 tablet (180 mg total) by mouth daily. (Patient not taking: Reported on 01/30/2022)   No facility-administered encounter medications on file as of 01/30/2022.    Allergies (verified) Levofloxacin, Azithromycin, Erythromycin, Oxycodone, and Sulfa antibiotics   History: Past Medical History:  Diagnosis Date   Acute appendicitis 03/11/2016   Allergy    Allergy-induced asthma    Frequent headaches    Hypercalcemia 03/18/2019   Hyperlipidemia    Irritable bowel syndrome (IBS)    Neuropathy    Thyroid disease    Past Surgical History:  Procedure Laterality Date   INCISIONAL HERNIA REPAIR N/A 09/05/2017  Procedure: HERNIA REPAIR INCISIONAL;  Surgeon: Vickie Epley, MD;  Location: [facility] ORS;  Service: General;  Laterality: N/A;   LAPAROSCOPIC APPENDECTOMY N/A 10/19/2016   Procedure: APPENDECTOMY LAPAROSCOPIC;  Surgeon: Dia Crawford III, MD;  Location: [facility] ORS;  Service: General;  Laterality: N/A;   MEDIAL PARTIAL KNEE REPLACEMENT Bilateral    THYROID SURGERY     Family History  Problem Relation Age of Onset   Arthritis Mother    Diabetes Mother    Heart disease Father    Breast cancer Sister    Diabetes Maternal Uncle    Social History   Socioeconomic  History   Marital status: Married    Spouse name: Not on file   Number of children: Not on file   Years of education: Not on file   Highest education level: Not on file  Occupational History   Not on file  Tobacco Use   Smoking status: Never   Smokeless tobacco: Never  Vaping Use   Vaping Use: Never used  Substance and Sexual Activity   Alcohol use: No    Alcohol/week: 0.0 standard drinks   Drug use: No   Sexual activity: Never  Other Topics Concern   Not on file  Social History Narrative   Not on file   Social Determinants of Health   Financial Resource Strain: Low Risk    Difficulty of Paying Living Expenses: Not hard at all  Food Insecurity: No Food Insecurity   Worried About Charity fundraiser in the Last Year: Never true   [place] in the Last Year: Never true  Transportation Needs: No Transportation Needs   Lack of Transportation (Medical): No   Lack of Transportation (Non-Medical): No  Physical Activity: Insufficiently Active   Days of Exercise per Week: 3 days   Minutes of Exercise per Session: 20 min  Stress: No Stress Concern Present   Feeling of Stress : Only a little  Social Connections: Engineer, building services of Communication with Friends and Family: Twice a week   Frequency of Social Gatherings with Friends and Family: Once a week   Attends Religious Services: More than 4 times per year   Active Member of [employer] or Organizations: Yes   Attends Music therapist: More than 4 times per year   Marital Status: Married    Tobacco Counseling Counseling given: Not Answered   Clinical Intake:  Pre-visit preparation completed: Yes  Pain : 0-10 Pain Score: 4  Pain Location: Shoulder Pain Orientation: Right, Left Pain Descriptors / Indicators: Constant, Aching Pain Onset: More than a month ago Pain Frequency: Constant     Nutritional Risks: None  How often do you need to have someone help you when you read instructions,  pamphlets, or other written materials from your doctor or pharmacy?: 1 - Never  Diabetic?  no  Interpreter Needed?: No  Information entered by :: Leroy Kennedy LPN   Activities of Daily Living In your present state of health, do you have any difficulty performing the following activities: 01/30/2022 09/14/2021  Hearing? N N  Vision? N N  Difficulty concentrating or making decisions? N Y  Walking or climbing stairs? N Y  Dressing or bathing? N N  Doing errands, shopping? - Scientist, forensic and eating ? N -  Using the Toilet? N -  In the past six months, have you accidently leaked urine? N -  Do you have problems with loss of bowel control? N -  Managing your Medications? N -  Managing your Finances? N -  Housekeeping or managing your Housekeeping? N -  Some recent data might be hidden    Patient Care Team: Charlynne Cousins, MD as PCP - General (Internal Medicine)  Indicate any recent Medical Services you may have received from other than [facility] providers in the past year (date may be approximate).     Assessment:   This is a routine wellness examination for [place].  Hearing/Vision screen Hearing Screening - Comments:: No trouble hearing Vision Screening - Comments:: Not up to  date [facility]  Dietary issues and exercise activities discussed: Current Exercise Habits: Home exercise routine, Type of exercise: stretching, Time (Minutes): 30, Frequency (Times/Week): 4, Weekly Exercise (Minutes/Week): 120, Intensity: Mild   Goals Addressed             This Visit's Progress    Patient Stated       Continue current lifestyle       Depression Screen PHQ 2/9 Scores 01/30/2022 01/23/2022 10/19/2021 09/14/2021 01/25/2021 10/19/2020 09/20/2020  PHQ - 2 Score 1 1 1 4  0 0 0  PHQ- 9 Score 9 9 8 11  - - -    Fall Risk Fall Risk  01/30/2022 01/23/2022 10/19/2021 09/14/2021 01/25/2021  Falls in the past year? 0 0 0 0 0  Number falls in past yr: 0 0 0 0 -  Injury with Fall? 0 0 0 0 -  Risk  for fall due to : - No Fall Risks - No Fall Risks -  Follow up Falls evaluation completed;Falls prevention discussed Falls evaluation completed - Falls evaluation completed -    FALL RISK PREVENTION PERTAINING TO THE HOME:  Any stairs in or around the home? No  If so, are there any without handrails? No  Home free of loose throw rugs in walkways, pet beds, electrical cords, etc? Yes  Adequate lighting in your home to reduce risk of falls? Yes   ASSISTIVE DEVICES UTILIZED TO PREVENT FALLS:  Life alert? No  Use of a cane, walker or w/c? No  Grab bars in the bathroom? Yes  Shower chair or bench in shower? No  Elevated toilet seat or a handicapped toilet? Yes   TIMED UP AND GO:  Was the test performed? No .    Cognitive Function:  Normal cognitive status assessed by direct observation by this Nurse Health Advisor. No abnormalities found.     MMSE - Mini Mental State Exam 01/25/2021 01/27/2020  Orientation to time 5 5  Orientation to Place 5 5  Registration 3 3  Attention/ Calculation 5 5  Recall 3 3  Language- name 2 objects 2 2  Language- repeat 1 1  Language- follow 3 step command 3 3  Language- read & follow direction 1 1  Write a sentence 1 1  Copy design 1 1  Total score 30 30        Immunizations Immunization History  Administered Date(s) Administered   Pneumococcal Polysaccharide-23 10/21/2014    TDAP status: Due, Education has been provided regarding the importance of this vaccine. Advised may receive this vaccine at local pharmacy or Health Dept. Aware to provide a copy of the vaccination record if obtained from local pharmacy or Health Dept. Verbalized acceptance and understanding.  Flu Vaccine status: Due, Education has been provided regarding the importance of this vaccine. Advised may receive this vaccine at local pharmacy or Health Dept. Aware to provide a copy of the vaccination record if obtained from  local pharmacy or Health Dept. Verbalized acceptance  and understanding.  Pneumococcal vaccine status: Due, Education has been provided regarding the importance of this vaccine. Advised may receive this vaccine at local pharmacy or Health Dept. Aware to provide a copy of the vaccination record if obtained from local pharmacy or Health Dept. Verbalized acceptance and understanding.  Covid-19 vaccine status: Declined, Education has been provided regarding the importance of this vaccine but patient still declined. Advised may receive this vaccine at local pharmacy or Health Dept.or vaccine clinic. Aware to provide a copy of the vaccination record if obtained from local pharmacy or Health Dept. Verbalized acceptance and understanding.  Qualifies for Shingles Vaccine? Yes   Zostavax completed No   Shingrix Completed?: Yes  Screening Tests Health Maintenance  Topic Date Due   COVID-19 Vaccine (1) 02/08/2022 (Originally 03/04/1952)   INFLUENZA VACCINE  03/15/2022 (Originally 07/16/2021)   Zoster Vaccines- Shingrix (1 of 2) 04/22/2022 (Originally 09/05/1999)   MAMMOGRAM  09/14/2022 (Originally 09/05/1999)   TETANUS/TDAP  09/14/2022 (Originally [birth year]04/1968)   Pneumonia Vaccine 53+ Years old (2 - PCV) 01/23/2023 (Originally 10/22/2015)   Hepatitis C Screening  01/23/2023 (Originally 09/05/1969)   DEXA SCAN  01/30/2023 (Originally 09/04/2014)   COLONOSCOPY (Pts 45-103yrs Insurance coverage will need to be confirmed)  02/21/2024   HPV VACCINES  Aged Out    Health Maintenance  There are no preventive care reminders to display for this patient.   Colorectal cancer screening: Type of screening: Cologuard. Completed 2022. Repeat every 3 years  Mammogram  Declined  Bone Density  Declined  Lung Cancer Screening: (Low Dose CT Chest recommended if Age 71-80 years, 30 pack-year currently smoking OR have quit w/in 15years.) does not qualify.   Lung Cancer Screening Referral:   Additional Screening:  Hepatitis C Screening: does qualify;  Vision Screening:  Recommended annual ophthalmology exams for early detection of glaucoma and other disorders of the eye. Is the patient up to date with their annual eye exam?  No  Who is the provider or what is the name of the office in which the patient attends annual eye exams? [facility] If pt is not established with a provider, would they like to be referred to a provider to establish care? No .   Dental Screening: Recommended annual dental exams for proper oral hygiene  Community Resource Referral / Chronic Care Management: CRR required this visit?  No   CCM required this visit?  No      Plan:     I have personally reviewed and noted the following in the patients chart:   Medical and social history Use of alcohol, tobacco or illicit drugs  Current medications and supplements including opioid prescriptions.  Functional ability and status Nutritional status Physical activity Advanced directives List of other physicians Hospitalizations, surgeries, and ER visits in previous 12 months Vitals Screenings to include cognitive, depression, and falls Referrals and appointments  In addition, I have reviewed and discussed with patient certain preventive protocols, quality metrics, and best practice recommendations. A written personalized care plan for preventive services as well as general preventive health recommendations were provided to patient.     Leroy Kennedy, LPN   01/05/9757   Nurse Notes:

## 2022-01-30 NOTE — Patient Instructions (Signed)
Danielle Nash , Thank you for taking time to come for your Medicare Wellness Visit. I appreciate your ongoing commitment to your health goals. Please review the following plan we discussed and let me know if I can assist you in the future.   Screening recommendations/referrals: Colonoscopy: up to date Mammogram: Education provided Bone Density: Education provided Recommended yearly ophthalmology/optometry visit for glaucoma screening and checkup Recommended yearly dental visit for hygiene and checkup  Vaccinations: Influenza vaccine: Education provided Pneumococcal vaccine: Education provided Tdap vaccine: Education provided Shingles vaccine: up to date     Advanced directives: Education provided  Conditions/risks identified:   Next appointment: 07-24-2022   9:20  vigg   Preventive Care 73 Years and Older, Female Preventive care refers to lifestyle choices and visits with your health care provider that can promote health and wellness. What does preventive care include? A yearly physical exam. This is also called an annual well check. Dental exams once or twice a year. Routine eye exams. Ask your health care provider how often you should have your eyes checked. Personal lifestyle choices, including: Daily care of your teeth and gums. Regular physical activity. Eating a healthy diet. Avoiding tobacco and drug use. Limiting alcohol use. Practicing safe sex. Taking low-dose aspirin every day. Taking vitamin and mineral supplements as recommended by your health care provider. What happens during an annual well check? The services and screenings done by your health care provider during your annual well check will depend on your age, overall health, lifestyle risk factors, and family history of disease. Counseling  Your health care provider may ask you questions about your: Alcohol use. Tobacco use. Drug use. Emotional well-being. Home and relationship well-being. Sexual  activity. Eating habits. History of falls. Memory and ability to understand (cognition). Work and work Statistician. Reproductive health. Screening  You may have the following tests or measurements: Height, weight, and BMI. Blood pressure. Lipid and cholesterol levels. These may be checked every 5 years, or more frequently if you are over 31 years old. Skin check. Lung cancer screening. You may have this screening every year starting at age 27 if you have a 30-pack-year history of smoking and currently smoke or have quit within the past 15 years. Fecal occult blood test (FOBT) of the stool. You may have this test every year starting at age 30. Flexible sigmoidoscopy or colonoscopy. You may have a sigmoidoscopy every 5 years or a colonoscopy every 10 years starting at age 77. Hepatitis C blood test. Hepatitis B blood test. Sexually transmitted disease (STD) testing. Diabetes screening. This is done by checking your blood sugar (glucose) after you have not eaten for a while (fasting). You may have this done every 1-3 years. Bone density scan. This is done to screen for osteoporosis. You may have this done starting at age 24. Mammogram. This may be done every 1-2 years. Talk to your health care provider about how often you should have regular mammograms. Talk with your health care provider about your test results, treatment options, and if necessary, the need for more tests. Vaccines  Your health care provider may recommend certain vaccines, such as: Influenza vaccine. This is recommended every year. Tetanus, diphtheria, and acellular pertussis (Tdap, Td) vaccine. You may need a Td booster every 10 years. Zoster vaccine. You may need this after age 84. Pneumococcal 13-valent conjugate (PCV13) vaccine. One dose is recommended after age 58. Pneumococcal polysaccharide (PPSV23) vaccine. One dose is recommended after age 48. Talk to your health care provider about  which screenings and vaccines  you need and how often you need them. This information is not intended to replace advice given to you by your health care provider. Make sure you discuss any questions you have with your health care provider. Document Released: 12/29/2015 Document Revised: 08/21/2016 Document Reviewed: 10/03/2015 Elsevier Interactive Patient Education  2017 [place] Prevention in the Home Falls can cause injuries. They can happen to people of all ages. There are many things you can do to make your home safe and to help prevent falls. What can I do on the outside of my home? Regularly fix the edges of walkways and driveways and fix any cracks. Remove anything that might make you trip as you walk through a door, such as a raised step or threshold. Trim any bushes or trees on the path to your home. Use bright outdoor lighting. Clear any walking paths of anything that might make someone trip, such as rocks or tools. Regularly check to see if handrails are loose or broken. Make sure that both sides of any steps have handrails. Any raised decks and porches should have guardrails on the edges. Have any leaves, snow, or ice cleared regularly. Use sand or salt on walking paths during winter. Clean up any spills in your garage right away. This includes oil or grease spills. What can I do in the bathroom? Use night lights. Install grab bars by the toilet and in the tub and shower. Do not use towel bars as grab bars. Use non-skid mats or decals in the tub or shower. If you need to sit down in the shower, use a plastic, non-slip stool. Keep the floor dry. Clean up any water that spills on the floor as soon as it happens. Remove soap buildup in the tub or shower regularly. Attach bath mats securely with double-sided non-slip rug tape. Do not have throw rugs and other things on the floor that can make you trip. What can I do in the bedroom? Use night lights. Make sure that you have a light by your bed that  is easy to reach. Do not use any sheets or blankets that are too big for your bed. They should not hang down onto the floor. Have a firm chair that has side arms. You can use this for support while you get dressed. Do not have throw rugs and other things on the floor that can make you trip. What can I do in the kitchen? Clean up any spills right away. Avoid walking on wet floors. Keep items that you use a lot in easy-to-reach places. If you need to reach something above you, use a strong step stool that has a grab bar. Keep electrical cords out of the way. Do not use floor polish or wax that makes floors slippery. If you must use wax, use non-skid floor wax. Do not have throw rugs and other things on the floor that can make you trip. What can I do with my stairs? Do not leave any items on the stairs. Make sure that there are handrails on both sides of the stairs and use them. Fix handrails that are broken or loose. Make sure that handrails are as long as the stairways. Check any carpeting to make sure that it is firmly attached to the stairs. Fix any carpet that is loose or worn. Avoid having throw rugs at the top or bottom of the stairs. If you do have throw rugs, attach them to the floor with  carpet tape. Make sure that you have a light switch at the top of the stairs and the bottom of the stairs. If you do not have them, ask someone to add them for you. What else can I do to help prevent falls? Wear shoes that: Do not have high heels. Have rubber bottoms. Are comfortable and fit you well. Are closed at the toe. Do not wear sandals. If you use a stepladder: Make sure that it is fully opened. Do not climb a closed stepladder. Make sure that both sides of the stepladder are locked into place. Ask someone to hold it for you, if possible. Clearly mark and make sure that you can see: Any grab bars or handrails. First and last steps. Where the edge of each step is. Use tools that help you  move around (mobility aids) if they are needed. These include: Canes. Walkers. Scooters. Crutches. Turn on the lights when you go into a dark area. Replace any light bulbs as soon as they burn out. Set up your furniture so you have a clear path. Avoid moving your furniture around. If any of your floors are uneven, fix them. If there are any pets around you, be aware of where they are. Review your medicines with your doctor. Some medicines can make you feel dizzy. This can increase your chance of falling. Ask your doctor what other things that you can do to help prevent falls. This information is not intended to replace advice given to you by your health care provider. Make sure you discuss any questions you have with your health care provider. Document Released: 09/28/2009 Document Revised: 05/09/2016 Document Reviewed: 01/06/2015 Elsevier Interactive Patient Education  2017 [employer].

## 2022-02-06 NOTE — Telephone Encounter (Signed)
Called patient and lvm for patient to return call regarding her message to Dr. Neomia Dear

## 2022-02-06 NOTE — Telephone Encounter (Signed)
Can you pl call pt and see whats needed thnx

## 2022-02-06 NOTE — Telephone Encounter (Signed)
Spoke with patient, she stated that she was better and did not need any further assistance from [place] at this time. Did also state that she is having a lot of fatigue, ask her how long she stated that it was for the past 17 yrs.

## 2022-02-15 DIAGNOSIS — J019 Acute sinusitis, unspecified: Secondary | ICD-10-CM | POA: Diagnosis not present

## 2022-02-15 DIAGNOSIS — J329 Chronic sinusitis, unspecified: Secondary | ICD-10-CM | POA: Diagnosis not present

## 2022-02-24 DIAGNOSIS — G4734 Idiopathic sleep related nonobstructive alveolar hypoventilation: Secondary | ICD-10-CM | POA: Diagnosis not present

## 2022-03-26 DIAGNOSIS — H04123 Dry eye syndrome of bilateral lacrimal glands: Secondary | ICD-10-CM | POA: Diagnosis not present

## 2022-03-26 DIAGNOSIS — H40003 Preglaucoma, unspecified, bilateral: Secondary | ICD-10-CM | POA: Diagnosis not present

## 2022-03-26 DIAGNOSIS — H2513 Age-related nuclear cataract, bilateral: Secondary | ICD-10-CM | POA: Diagnosis not present

## 2022-03-27 DIAGNOSIS — G4734 Idiopathic sleep related nonobstructive alveolar hypoventilation: Secondary | ICD-10-CM | POA: Diagnosis not present

## 2022-03-29 ENCOUNTER — Telehealth: Payer: Self-pay

## 2022-03-29 ENCOUNTER — Telehealth: Payer: Self-pay | Admitting: Internal Medicine

## 2022-03-29 NOTE — Telephone Encounter (Signed)
Spoke with aaron from [facility] about  oxygen re certification for ONO pt need appt and also spoke with pt and she said she will see [place] for pulmonology so gave toni to make appt with lauren for re certification for Oxygen at night  ?

## 2022-03-29 NOTE — Telephone Encounter (Signed)
Copied from [place] [id number]. Topic: General - Other ?>> Mar 29, 2022  8:54 AM Tessa Lerner A wrote: ?Reason for CRM: Nimisha with [facility] has called to share that the patient needs to be seen by their pulmonologist to continue receiving oxygen supplies  ? ?The patient will be contacted by [facility] to schedule an appointment before their oxygen will be ordered ? ?Please contact further if needed ?

## 2022-04-03 ENCOUNTER — Telehealth: Payer: Self-pay

## 2022-04-03 ENCOUNTER — Encounter: Payer: Self-pay | Admitting: Nurse Practitioner

## 2022-04-03 ENCOUNTER — Ambulatory Visit (INDEPENDENT_AMBULATORY_CARE_PROVIDER_SITE_OTHER): Payer: Medicare Other | Admitting: Nurse Practitioner

## 2022-04-03 VITALS — BP 138/80 | HR 69 | Temp 97.6°F | Resp 16 | Ht 60.0 in | Wt 165.0 lb

## 2022-04-03 DIAGNOSIS — G4734 Idiopathic sleep related nonobstructive alveolar hypoventilation: Secondary | ICD-10-CM

## 2022-04-03 NOTE — Telephone Encounter (Signed)
Pulse oximetry ordered. Printed. Gave to Desha-Toni ?

## 2022-04-03 NOTE — Progress Notes (Signed)
[facility] ?[address] ?[place], [place] [address] ? ?Internal MEDICINE  ?Office Visit Note ? ?Patient Name: Danielle Nash ? [id number]  ?[id number] ? ?Date of Service: 04/03/2022 ? ?Chief Complaint  ?Patient presents with  ? Follow-up  ?  Patient is reconsidering being recertified for oxygen because she feels it is not as helpful as she had hoped  ? ? ?HPI ?Danielle Nash presents for a follow-up pulmonary visit for a repeat overnight pulse oximetry.  She initially wanted to be recertified for her oxygen that she uses at night because her oxygen drops when she is sleeping but she reports that the oxygen has not really been helping much that she can tell and she is hoping that the repeat overnight pulse oximetry will show that she does not need the oxygen and she can just have it stopped. ? ? ? ? ?Current Medication: ?Outpatient Encounter Medications as of 04/03/2022  ?Medication Sig  ? B Complex Vitamins (VITAMIN-B COMPLEX) TABS Take 1 tablet by mouth 1 day or 1 dose.  ? Cholecalciferol (VITAMIN D) 2000 units tablet Take 4,000 Units by mouth 1 day or 1 dose.  ? dicyclomine (BENTYL) 10 MG capsule Take 1 capsule (10 mg total) by mouth 4 (four) times daily -  before meals and at bedtime.  ? fexofenadine (ALLEGRA ALLERGY) 180 MG tablet Take 1 tablet (180 mg total) by mouth daily.  ? fluticasone (FLONASE) 50 MCG/ACT nasal spray Place into both nostrils daily.  ? hydrOXYzine (VISTARIL) 25 MG capsule Take 1 capsule (25 mg total) by mouth every 8 (eight) hours as needed for itching (sleep).  ? lidocaine (LIDODERM) 5 % Place 1 patch onto the skin daily. Remove & Discard patch within 12 hours or as directed by MD  ? MAGNESIUM PO Take 1 tablet by mouth 1 day or 1 dose.  ? mometasone (NASONEX) 50 MCG/ACT nasal spray Place 2 sprays into the nose daily.  ? Probiotic Product (PROBIOTIC & ACIDOPHILUS EX ST) CAPS Take 1 tablet by mouth 1 day or 1 dose.  ? rizatriptan (MAXALT) 10 MG tablet Take 1 tablet (10 mg total) by mouth as  needed for migraine. May repeat in 2 hours if needed  ? tiZANidine (ZANAFLEX) 4 MG tablet Take 1 tablet (4 mg total) by mouth every 6 (six) hours as needed for muscle spasms.  ? triamcinolone cream (KENALOG) 0.1 % Apply 1 application topically 2 (two) times daily.  ? vitamin B-12 (CYANOCOBALAMIN) 1000 MCG tablet Take 1 tablet by mouth 1 day or 1 dose.  ? vitamin E 400 UNIT capsule Take 1 capsule by mouth 1 day or 1 dose.  ? ?No facility-administered encounter medications on file as of 04/03/2022.  ? ? ?Surgical History: ?Past Surgical History:  ?Procedure Laterality Date  ? INCISIONAL HERNIA REPAIR N/A 09/05/2017  ? Procedure: HERNIA REPAIR INCISIONAL;  Surgeon: Vickie Epley, MD;  Location: [facility] ORS;  Service: General;  Laterality: N/A;  ? LAPAROSCOPIC APPENDECTOMY N/A 10/19/2016  ? Procedure: APPENDECTOMY LAPAROSCOPIC;  Surgeon: Dia Crawford III, MD;  Location: [facility] ORS;  Service: General;  Laterality: N/A;  ? MEDIAL PARTIAL KNEE REPLACEMENT Bilateral   ? THYROID SURGERY    ? ? ?Medical History: ?Past Medical History:  ?Diagnosis Date  ? Acute appendicitis 03/11/2016  ? Allergy   ? Allergy-induced asthma   ? Frequent headaches   ? Hypercalcemia 03/18/2019  ? Hyperlipidemia   ? Irritable bowel syndrome (IBS)   ? Neuropathy   ? Thyroid disease   ? ? ?  Family History: ?Family History  ?Problem Relation Age of Onset  ? Arthritis Mother   ? Diabetes Mother   ? Heart disease Father   ? Breast cancer Sister   ? Diabetes Maternal Uncle   ? ? ?Social History  ? ?Socioeconomic History  ? Marital status: Married  ?  Spouse name: Not on file  ? Number of children: Not on file  ? Years of education: Not on file  ? Highest education level: Not on file  ?Occupational History  ? Not on file  ?Tobacco Use  ? Smoking status: Never  ? Smokeless tobacco: Never  ?Vaping Use  ? Vaping Use: Never used  ?Substance and Sexual Activity  ? Alcohol use: No  ?  Alcohol/week: 0.0 standard drinks  ? Drug use: No  ? Sexual activity: Never  ?Other  Topics Concern  ? Not on file  ?Social History Narrative  ? Not on file  ? ?Social Determinants of Health  ? ?Financial Resource Strain: Low Risk   ? Difficulty of Paying Living Expenses: Not hard at all  ?Food Insecurity: No Food Insecurity  ? Worried About Charity fundraiser in the Last Year: Never true  ? Ran Out of Food in the Last Year: Never true  ?Transportation Needs: No Transportation Needs  ? Lack of Transportation (Medical): No  ? Lack of Transportation (Non-Medical): No  ?Physical Activity: Insufficiently Active  ? Days of Exercise per Week: 3 days  ? Minutes of Exercise per Session: 20 min  ?Stress: No Stress Concern Present  ? Feeling of Stress : Only a little  ?Social Connections: Socially Integrated  ? Frequency of Communication with Friends and Family: Twice a week  ? Frequency of Social Gatherings with Friends and Family: Once a week  ? Attends Religious Services: More than 4 times per year  ? Active Member of Clubs or Organizations: Yes  ? Attends Archivist Meetings: More than 4 times per year  ? Marital Status: Married  ?Intimate Partner Violence: Not At Risk  ? Fear of Current or Ex-Partner: No  ? Emotionally Abused: No  ? Physically Abused: No  ? Sexually Abused: No  ? ? ? ? ?Review of Systems  ?Constitutional:  Negative for chills, fatigue and fever.  ?HENT: Negative.  Negative for congestion, postnasal drip and rhinorrhea.   ?Respiratory: Negative.  Negative for cough, chest tightness, shortness of breath and wheezing.   ?Cardiovascular: Negative.  Negative for chest pain and palpitations.  ?Genitourinary: Negative.   ?Neurological: Negative.  Negative for dizziness, light-headedness and headaches.  ? ?Vital Signs: ?BP 138/80   Pulse 69   Temp 97.6 ?F (36.4 ?C)   Resp 16   Ht 5' (1.524 m)   Wt 165 lb (74.8 kg)   SpO2 98%   BMI 32.22 kg/m?  ? ? ?Physical Exam ?Vitals reviewed.  ?Constitutional:   ?   General: She is not in acute distress. ?   Appearance: Normal appearance.  She is obese. She is not ill-appearing.  ?HENT:  ?   Head: Normocephalic and atraumatic.  ?Eyes:  ?   Pupils: Pupils are equal, round, and reactive to light.  ?Cardiovascular:  ?   Rate and Rhythm: Normal rate and regular rhythm.  ?Pulmonary:  ?   Effort: Pulmonary effort is normal. No respiratory distress.  ?   Breath sounds: Normal breath sounds.  ?Neurological:  ?   Mental Status: She is alert and oriented to person, place, and time.  ?  Psychiatric:     ?   Mood and Affect: Mood normal.     ?   Behavior: Behavior normal.  ? ? ? ? ? ?Assessment/Plan: ?1. Nocturnal hypoxia ?Overnight pulse oximetry ordered and need to be expedited for patient. ?- Pulse oximetry, overnight; Future ? ? ?General Counseling: tityana pagan understanding of the findings of todays visit and agrees with plan of treatment. I have discussed any further diagnostic evaluation that may be needed or ordered today. We also reviewed her medications today. she has been encouraged to call the office with any questions or concerns that should arise related to todays visit. ? ? ? ?Orders Placed This Encounter  ?Procedures  ? Pulse oximetry, overnight  ? ? ?No orders of the defined types were placed in this encounter. ? ? ?Return for patient plans to follow up with Anntonette Madewell for PCP, will call for follow up appt. . ? ? ?Total time spent:20 Minutes ?Time spent includes review of chart, medications, test results, and follow up plan with the patient.  ? ?[place] Controlled Substance Database was reviewed by me. ? ?This patient was seen by Jonetta Osgood, FNP-C in collaboration with Dr. Clayborn Bigness as a part of collaborative care agreement. ? ? ?Ishan Sanroman R. Valetta Fuller, MSN, FNP-C ?Internal medicine  ?

## 2022-04-06 ENCOUNTER — Encounter: Payer: Self-pay | Admitting: Nurse Practitioner

## 2022-04-09 DIAGNOSIS — R0902 Hypoxemia: Secondary | ICD-10-CM | POA: Diagnosis not present

## 2022-04-10 ENCOUNTER — Telehealth: Payer: Self-pay

## 2022-04-11 ENCOUNTER — Telehealth: Payer: Self-pay

## 2022-04-11 ENCOUNTER — Other Ambulatory Visit: Payer: Self-pay | Admitting: Nurse Practitioner

## 2022-04-11 DIAGNOSIS — G4734 Idiopathic sleep related nonobstructive alveolar hypoventilation: Secondary | ICD-10-CM

## 2022-04-11 NOTE — Telephone Encounter (Signed)
Lmom ONO result pt need to used oxygen at night  ?

## 2022-04-11 NOTE — Telephone Encounter (Signed)
Spoke with sarah from [facility] we put order for oxygen  ?

## 2022-04-12 ENCOUNTER — Telehealth: Payer: Self-pay

## 2022-04-12 ENCOUNTER — Encounter: Payer: Self-pay | Admitting: Nurse Practitioner

## 2022-04-12 NOTE — Telephone Encounter (Signed)
Spoke with pt and gave her ONO result that due to result pt need Oxygen at night and we already send oxygen order to [facility]  ?

## 2022-04-25 ENCOUNTER — Ambulatory Visit (INDEPENDENT_AMBULATORY_CARE_PROVIDER_SITE_OTHER): Payer: Medicare Other | Admitting: Nurse Practitioner

## 2022-04-25 ENCOUNTER — Encounter: Payer: Self-pay | Admitting: Nurse Practitioner

## 2022-04-25 VITALS — BP 140/75 | HR 60 | Temp 98.2°F | Resp 16 | Ht 60.0 in | Wt 166.8 lb

## 2022-04-25 DIAGNOSIS — G4719 Other hypersomnia: Secondary | ICD-10-CM

## 2022-04-25 DIAGNOSIS — G4734 Idiopathic sleep related nonobstructive alveolar hypoventilation: Secondary | ICD-10-CM

## 2022-04-25 DIAGNOSIS — M720 Palmar fascial fibromatosis [Dupuytren]: Secondary | ICD-10-CM

## 2022-04-25 DIAGNOSIS — R5383 Other fatigue: Secondary | ICD-10-CM | POA: Diagnosis not present

## 2022-04-25 DIAGNOSIS — Z7689 Persons encountering health services in other specified circumstances: Secondary | ICD-10-CM | POA: Diagnosis not present

## 2022-04-25 DIAGNOSIS — I700 Atherosclerosis of aorta: Secondary | ICD-10-CM

## 2022-04-25 DIAGNOSIS — F5104 Psychophysiologic insomnia: Secondary | ICD-10-CM

## 2022-04-25 DIAGNOSIS — F419 Anxiety disorder, unspecified: Secondary | ICD-10-CM

## 2022-04-25 MED ORDER — HYDROXYZINE HCL 25 MG PO TABS: 12.5000 mg | ORAL_TABLET | Freq: Three times a day (TID) | ORAL | 0 refills | Status: DC | PRN

## 2022-04-25 MED ORDER — ESZOPICLONE 1 MG PO TABS: 1.0000 mg | ORAL_TABLET | Freq: Every evening | ORAL | 0 refills | Status: DC | PRN

## 2022-04-25 NOTE — Progress Notes (Signed)
[facility] [place], [place] [address]  Internal MEDICINE  Office Visit Note  Patient Name: Danielle Nash  [id number]  [id number]  Date of Service: 04/25/2022   Complaints/HPI Pt is here for establishment of PCP. Chief Complaint  Patient presents with   New Patient (Initial Visit)    Low energy    Hyperlipidemia   Asthma   HPI Danielle Nash presents for a new patient visit to establish care.  She is a well-appearing 71 year old female who is reestablishing care with [facility].  I will be designated as her PCP, Drema Dallas, PA-C will take over her pulmonary management.  She is due for routine screening mammogram and possibly a DEXA scan.  She has chronic medical problems that include hypothyroidism, primary hyperparathyroidism, lumbar radiculitis, allergy induced asthma, chronic insomnia and excessive daytime sleepiness.  Continuity of care and seeing the same provider consistently is important to her.  She is interested in trying something different to help her sleep at night.  Patient does have aortic atherosclerosis as seen on previous imaging and has chronic daytime sleepiness chronic insomnia.  She has not had a recent echocardiogram.   Current Medication: Outpatient Encounter Medications as of 04/25/2022  Medication Sig   B Complex Vitamins (VITAMIN-B COMPLEX) TABS Take 1 tablet by mouth 1 day or 1 dose.   Cholecalciferol (VITAMIN D) 2000 units tablet Take 4,000 Units by mouth 1 day or 1 dose.   dicyclomine (BENTYL) 10 MG capsule Take 1 capsule (10 mg total) by mouth 4 (four) times daily -  before meals and at bedtime.   eszopiclone (LUNESTA) 1 MG TABS tablet Take 1-2 tablets (1-2 mg total) by mouth at bedtime as needed for sleep. Take immediately before bedtime   fexofenadine (ALLEGRA ALLERGY) 180 MG tablet Take 1 tablet (180 mg total) by mouth daily.   fluticasone (FLONASE) 50 MCG/ACT nasal spray Place into both nostrils daily.    hydrOXYzine (ATARAX) 25 MG tablet Take 0.5-1 tablets (12.5-25 mg total) by mouth every 8 (eight) hours as needed for itching or anxiety.   lidocaine (LIDODERM) 5 % Place 1 patch onto the skin daily. Remove & Discard patch within 12 hours or as directed by MD   MAGNESIUM PO Take 1 tablet by mouth 1 day or 1 dose.   mometasone (NASONEX) 50 MCG/ACT nasal spray Place 2 sprays into the nose daily.   Probiotic Product (PROBIOTIC & ACIDOPHILUS EX ST) CAPS Take 1 tablet by mouth 1 day or 1 dose.   rizatriptan (MAXALT) 10 MG tablet Take 1 tablet (10 mg total) by mouth as needed for migraine. May repeat in 2 hours if needed   tiZANidine (ZANAFLEX) 4 MG tablet Take 1 tablet (4 mg total) by mouth every 6 (six) hours as needed for muscle spasms.   triamcinolone cream (KENALOG) 0.1 % Apply 1 application topically 2 (two) times daily.   vitamin B-12 (CYANOCOBALAMIN) 1000 MCG tablet Take 1 tablet by mouth 1 day or 1 dose.   vitamin E 400 UNIT capsule Take 1 capsule by mouth 1 day or 1 dose.   [DISCONTINUED] hydrOXYzine (VISTARIL) 25 MG capsule Take 1 capsule (25 mg total) by mouth every 8 (eight) hours as needed for itching (sleep).   No facility-administered encounter medications on file as of 04/25/2022.    Surgical History: Past Surgical History:  Procedure Laterality Date   INCISIONAL HERNIA REPAIR N/A 09/05/2017   Procedure: HERNIA REPAIR INCISIONAL;  Surgeon: Vickie Epley, MD;  Location:  [facility] ORS;  Service: General;  Laterality: N/A;   LAPAROSCOPIC APPENDECTOMY N/A 10/19/2016   Procedure: APPENDECTOMY LAPAROSCOPIC;  Surgeon: Dia Crawford III, MD;  Location: [facility] ORS;  Service: General;  Laterality: N/A;   MEDIAL PARTIAL KNEE REPLACEMENT Bilateral    THYROID SURGERY      Medical History: Past Medical History:  Diagnosis Date   Acute appendicitis 03/11/2016   Allergy    Allergy-induced asthma    Frequent headaches    Hypercalcemia 03/18/2019   Hyperlipidemia    Irritable bowel syndrome (IBS)     Neuropathy    Thyroid disease     Family History: Family History  Problem Relation Age of Onset   Arthritis Mother    Diabetes Mother    Heart disease Father    Breast cancer Sister    Diabetes Maternal Uncle     Social History   Socioeconomic History   Marital status: Married    Spouse name: Not on file   Number of children: Not on file   Years of education: Not on file   Highest education level: Not on file  Occupational History   Not on file  Tobacco Use   Smoking status: Never   Smokeless tobacco: Never  Vaping Use   Vaping Use: Never used  Substance and Sexual Activity   Alcohol use: No    Alcohol/week: 0.0 standard drinks   Drug use: No   Sexual activity: Never  Other Topics Concern   Not on file  Social History Narrative   Not on file   Social Determinants of Health   Financial Resource Strain: Low Risk    Difficulty of Paying Living Expenses: Not hard at all  Food Insecurity: No Food Insecurity   Worried About Charity fundraiser in the Last Year: Never true   Arboriculturist in the Last Year: Never true  Transportation Needs: No Transportation Needs   Lack of Transportation (Medical): No   Lack of Transportation (Non-Medical): No  Physical Activity: Insufficiently Active   Days of Exercise per Week: 3 days   Minutes of Exercise per Session: 20 min  Stress: No Stress Concern Present   Feeling of Stress : Only a little  Social Connections: Engineer, building services of Communication with Friends and Family: Twice a week   Frequency of Social Gatherings with Friends and Family: Once a week   Attends Religious Services: More than 4 times per year   Active Member of [employer] or Organizations: Yes   Attends Music therapist: More than 4 times per year   Marital Status: Married  Human resources officer Violence: Not At Risk   Fear of Current or Ex-Partner: No   Emotionally Abused: No   Physically Abused: No   Sexually Abused: No      Review of Systems  Constitutional:  Positive for fatigue and unexpected weight change. Negative for chills.  HENT:  Positive for postnasal drip. Negative for congestion, rhinorrhea, sneezing and sore throat.   Eyes:  Negative for redness.  Respiratory:  Negative for cough, chest tightness and shortness of breath.   Cardiovascular:  Negative for chest pain and palpitations.  Gastrointestinal: Negative.  Negative for abdominal pain, constipation, diarrhea, nausea and vomiting.  Genitourinary:  Negative for dysuria and frequency.  Musculoskeletal: Negative.  Negative for arthralgias, back pain, joint swelling and neck pain.  Skin:  Negative for rash.       Itchy nodules under the skin on the palm of  her hand  Neurological: Negative.  Negative for tremors and numbness.  Hematological:  Negative for adenopathy. Does not bruise/bleed easily.  Psychiatric/Behavioral:  Positive for sleep disturbance. Negative for behavioral problems (Depression), self-injury and suicidal ideas. The patient is nervous/anxious.    Vital Signs: BP 140/75   Pulse 60   Temp 98.2 F (36.8 C)   Resp 16   Ht 5' (1.524 m)   Wt 166 lb 12.8 oz (75.7 kg)   SpO2 97%   BMI 32.58 kg/m    Physical Exam Vitals reviewed.  Constitutional:      General: She is not in acute distress.    Appearance: Normal appearance. She is obese. She is not ill-appearing.  HENT:     Head: Normocephalic and atraumatic.  Eyes:     Pupils: Pupils are equal, round, and reactive to light.  Cardiovascular:     Rate and Rhythm: Normal rate and regular rhythm.  Pulmonary:     Effort: Pulmonary effort is normal. No respiratory distress.  Neurological:     Mental Status: She is alert and oriented to person, place, and time.     Cranial Nerves: No cranial nerve deficit.     Gait: Gait normal.  Psychiatric:        Mood and Affect: Mood normal.        Behavior: Behavior normal.      Assessment/Plan: 1. Other fatigue Patient has  chronic fatigue possibly related to daytime sleepiness and chronic mixed insomnia but other etiologies are being considered and will be ruled out with labs and testing, echocardiogram ordered as well. - Iron, TIBC and Ferritin Panel - B12 and Folate Panel - Vitamin D (25 hydroxy) - ECHOCARDIOGRAM COMPLETE; Future  2. Dupuytren's contracture of right hand Hydroxyzine prescribed as needed for itching of the nodules on her palm.  No further intervention is needed at this time, patient has been instructed to call clinic if the nodules become larger or painful but otherwise they should resolve on their own - hydrOXYzine (ATARAX) 25 MG tablet; Take 0.5-1 tablets (12.5-25 mg total) by mouth every 8 (eight) hours as needed for itching or anxiety.  Dispense: 30 tablet; Refill: 0  3. Aortic atherosclerosis ([place]) Patient does have plaque buildup with aortic atherosclerosis seen on imaging previously.  She is due for an echocardiogram and may need to consider follow-up carotid ultrasound if 1 has not been done recently. - ECHOCARDIOGRAM COMPLETE; Future  4. Excessive daytime sleepiness Patient does have excessive daytime sleepiness and does have mixed insomnia.  She is supposed to wear supplemental oxygen at night and she does not feel like it is helping very much.  A sleep study will be considered if not already done.  Patient also needs an echocardiogram. - ECHOCARDIOGRAM COMPLETE; Future  5. Nocturnal hypoxia Patient recently did a overnight pulse oximetry test and the results still stated that she is in need supplemental oxygen at night when she sleeping.  Recertification of her oxygen order has already been done  6. Encounter to establish care with new doctor Patient is reestablishing care for PCP here she has been a pulmonary patient and was previously being seen by a provider who is no longer at [facility] on the left she decided to choose a different PCP.  An important aspect of the  patient's treatment for her is continuity of care with consistent visits with the same provider.  7. Anxiety Refill hydroxyzine for anxiety - hydrOXYzine (ATARAX) 25 MG tablet; Take 0.5-1  tablets (12.5-25 mg total) by mouth every 8 (eight) hours as needed for itching or anxiety.  Dispense: 30 tablet; Refill: 0  8. Chronic insomnia Labs ordered, will try lunesta, - Iron, TIBC and Ferritin Panel - B12 and Folate Panel - Vitamin D (25 hydroxy) - hydrOXYzine (ATARAX) 25 MG tablet; Take 0.5-1 tablets (12.5-25 mg total) by mouth every 8 (eight) hours as needed for itching or anxiety.  Dispense: 30 tablet; Refill: 0 - eszopiclone (LUNESTA) 1 MG TABS tablet; Take 1-2 tablets (1-2 mg total) by mouth at bedtime as needed for sleep. Take immediately before bedtime  Dispense: 60 tablet; Refill: 0 - ECHOCARDIOGRAM COMPLETE; Future    General Counseling: elie gragert understanding of the findings of todays visit and agrees with plan of treatment. I have discussed any further diagnostic evaluation that may be needed or ordered today. We also reviewed her medications today. she has been encouraged to call the office with any questions or concerns that should arise related to todays visit.    Counseling:  [place] Controlled Substance Database was reviewed by me.  Orders Placed This Encounter  Procedures   Iron, TIBC and Ferritin Panel   B12 and Folate Panel   Vitamin D (25 hydroxy)    Meds ordered this encounter  Medications   hydrOXYzine (ATARAX) 25 MG tablet    Sig: Take 0.5-1 tablets (12.5-25 mg total) by mouth every 8 (eight) hours as needed for itching or anxiety.    Dispense:  30 tablet    Refill:  0   eszopiclone (LUNESTA) 1 MG TABS tablet    Sig: Take 1-2 tablets (1-2 mg total) by mouth at bedtime as needed for sleep. Take immediately before bedtime    Dispense:  60 tablet    Refill:  0    Return for F/U, Echo @ [place], eval new med, Labs, Suzy Kugel PCP in 3-4 weeks. .  Time spent:30  Minutes Time spent with patient included reviewing progress notes, labs, imaging studies, and discussing plan for follow up.   Controlled Substance Database was reviewed by me for overdose risk score (ORS)   This patient was seen by Jonetta Osgood, FNP-C in collaboration with Dr. Clayborn Bigness as a part of collaborative care agreement.    Evrett Hakim R. Valetta Fuller, MSN, FNP-C Internal Medicine

## 2022-04-26 DIAGNOSIS — G4734 Idiopathic sleep related nonobstructive alveolar hypoventilation: Secondary | ICD-10-CM | POA: Diagnosis not present

## 2022-04-27 ENCOUNTER — Encounter: Payer: Self-pay | Admitting: Nurse Practitioner

## 2022-04-29 DIAGNOSIS — D509 Iron deficiency anemia, unspecified: Secondary | ICD-10-CM | POA: Diagnosis not present

## 2022-04-29 DIAGNOSIS — F5101 Primary insomnia: Secondary | ICD-10-CM | POA: Diagnosis not present

## 2022-04-29 DIAGNOSIS — M19011 Primary osteoarthritis, right shoulder: Secondary | ICD-10-CM | POA: Diagnosis not present

## 2022-04-29 DIAGNOSIS — E559 Vitamin D deficiency, unspecified: Secondary | ICD-10-CM | POA: Diagnosis not present

## 2022-04-29 DIAGNOSIS — M19012 Primary osteoarthritis, left shoulder: Secondary | ICD-10-CM | POA: Diagnosis not present

## 2022-04-29 DIAGNOSIS — E611 Iron deficiency: Secondary | ICD-10-CM | POA: Diagnosis not present

## 2022-04-29 DIAGNOSIS — R5383 Other fatigue: Secondary | ICD-10-CM | POA: Diagnosis not present

## 2022-04-30 LAB — VITAMIN D 25 HYDROXY (VIT D DEFICIENCY, FRACTURES): Vit D, 25-Hydroxy: 87.3 ng/mL (ref 30.0–100.0)

## 2022-04-30 LAB — IRON,TIBC AND FERRITIN PANEL
Ferritin: 232 ng/mL — ABNORMAL HIGH (ref 15–150)
Iron Saturation: 17 % (ref 15–55)
Iron: 58 ug/dL (ref 27–139)
Total Iron Binding Capacity: 347 ug/dL (ref 250–450)
UIBC: 289 ug/dL (ref 118–369)

## 2022-04-30 LAB — B12 AND FOLATE PANEL
Folate: >20 ng/mL (ref >3.0)
Vitamin B-12: 1696 pg/mL — ABNORMAL HIGH (ref 232–1245)

## 2022-05-01 ENCOUNTER — Telehealth: Payer: Self-pay

## 2022-05-01 NOTE — Telephone Encounter (Signed)
Oxygen therapy order signed by provider and given to [facility] with [facility]. ?

## 2022-05-04 NOTE — Progress Notes (Signed)
I have reviewed the lab results. There are no critically abnormal values requiring immediate intervention but there are some abnormals that will be discussed at the next office visit.  

## 2022-05-22 ENCOUNTER — Telehealth: Payer: Self-pay

## 2022-05-22 ENCOUNTER — Encounter: Payer: Self-pay | Admitting: Nurse Practitioner

## 2022-05-22 ENCOUNTER — Ambulatory Visit (INDEPENDENT_AMBULATORY_CARE_PROVIDER_SITE_OTHER): Payer: Medicare Other | Admitting: Nurse Practitioner

## 2022-05-22 VITALS — BP 130/66 | HR 65 | Temp 98.4°F | Resp 16 | Ht 60.0 in | Wt 163.8 lb

## 2022-05-22 DIAGNOSIS — M8580 Other specified disorders of bone density and structure, unspecified site: Secondary | ICD-10-CM

## 2022-05-22 DIAGNOSIS — R5383 Other fatigue: Secondary | ICD-10-CM

## 2022-05-22 DIAGNOSIS — E210 Primary hyperparathyroidism: Secondary | ICD-10-CM | POA: Diagnosis not present

## 2022-05-22 DIAGNOSIS — Z780 Asymptomatic menopausal state: Secondary | ICD-10-CM

## 2022-05-22 DIAGNOSIS — M25511 Pain in right shoulder: Secondary | ICD-10-CM | POA: Diagnosis not present

## 2022-05-22 DIAGNOSIS — M19011 Primary osteoarthritis, right shoulder: Secondary | ICD-10-CM | POA: Diagnosis not present

## 2022-05-22 DIAGNOSIS — E039 Hypothyroidism, unspecified: Secondary | ICD-10-CM

## 2022-05-22 DIAGNOSIS — G4719 Other hypersomnia: Secondary | ICD-10-CM

## 2022-05-22 DIAGNOSIS — I700 Atherosclerosis of aorta: Secondary | ICD-10-CM | POA: Diagnosis not present

## 2022-05-22 DIAGNOSIS — M19012 Primary osteoarthritis, left shoulder: Secondary | ICD-10-CM | POA: Diagnosis not present

## 2022-05-22 DIAGNOSIS — G8929 Other chronic pain: Secondary | ICD-10-CM

## 2022-05-22 MED ORDER — TIZANIDINE HCL 4 MG PO TABS: 4.0000 mg | ORAL_TABLET | Freq: Four times a day (QID) | ORAL | 2 refills | Status: DC | PRN

## 2022-05-22 NOTE — Telephone Encounter (Signed)
Called patient to schedule follow up. She stated she will just call when she needs appointment-Toni

## 2022-05-22 NOTE — Telephone Encounter (Signed)
Awaiting 05/22/22 office notes for endorinology referral-Toni

## 2022-05-22 NOTE — Progress Notes (Signed)
[facility] [place], [place] [address]  Internal MEDICINE  Office Visit Note  Patient Name: Danielle Nash  [id number]  [id number]  Date of Service: 05/22/2022  Chief Complaint  Patient presents with   Follow-up   Fatigue    HPI Kadelyn presents for a follow up visit for fatigue, low energy, wants to have labs drawn. Wondering about epstein-barr virus, has hyperparathyroidism and hypothyroidism, will need to check thyroid labs  --osteopenia, need updated dexa scan.  --need endo referral for hyperparathyroidism and hypothyroidism.      Current Medication: Outpatient Encounter Medications as of 05/22/2022  Medication Sig   B Complex Vitamins (VITAMIN-B COMPLEX) TABS Take 1 tablet by mouth 1 day or 1 dose.   Cholecalciferol (VITAMIN D) 2000 units tablet Take 4,000 Units by mouth 1 day or 1 dose.   dicyclomine (BENTYL) 10 MG capsule Take 1 capsule (10 mg total) by mouth 4 (four) times daily -  before meals and at bedtime.   eszopiclone (LUNESTA) 1 MG TABS tablet Take 1-2 tablets (1-2 mg total) by mouth at bedtime as needed for sleep. Take immediately before bedtime   fexofenadine (ALLEGRA ALLERGY) 180 MG tablet Take 1 tablet (180 mg total) by mouth daily.   fluticasone (FLONASE) 50 MCG/ACT nasal spray Place into both nostrils daily.   hydrOXYzine (ATARAX) 25 MG tablet Take 0.5-1 tablets (12.5-25 mg total) by mouth every 8 (eight) hours as needed for itching or anxiety.   lidocaine (LIDODERM) 5 % Place 1 patch onto the skin daily. Remove & Discard patch within 12 hours or as directed by MD   MAGNESIUM PO Take 1 tablet by mouth 1 day or 1 dose.   mometasone (NASONEX) 50 MCG/ACT nasal spray Place 2 sprays into the nose daily.   Probiotic Product (PROBIOTIC & ACIDOPHILUS EX ST) CAPS Take 1 tablet by mouth 1 day or 1 dose.   rizatriptan (MAXALT) 10 MG tablet Take 1 tablet (10 mg total) by mouth as needed for migraine. May repeat in 2 hours if needed   triamcinolone  cream (KENALOG) 0.1 % Apply 1 application topically 2 (two) times daily.   vitamin B-12 (CYANOCOBALAMIN) 1000 MCG tablet Take 1 tablet by mouth 1 day or 1 dose.   vitamin E 400 UNIT capsule Take 1 capsule by mouth 1 day or 1 dose.   [DISCONTINUED] tiZANidine (ZANAFLEX) 4 MG tablet Take 1 tablet (4 mg total) by mouth every 6 (six) hours as needed for muscle spasms.   tiZANidine (ZANAFLEX) 4 MG tablet Take 1 tablet (4 mg total) by mouth every 6 (six) hours as needed for muscle spasms.   No facility-administered encounter medications on file as of 05/22/2022.    Surgical History: Past Surgical History:  Procedure Laterality Date   INCISIONAL HERNIA REPAIR N/A 09/05/2017   Procedure: HERNIA REPAIR INCISIONAL;  Surgeon: Vickie Epley, MD;  Location: [facility] ORS;  Service: General;  Laterality: N/A;   LAPAROSCOPIC APPENDECTOMY N/A 10/19/2016   Procedure: APPENDECTOMY LAPAROSCOPIC;  Surgeon: Dia Crawford III, MD;  Location: [facility] ORS;  Service: General;  Laterality: N/A;   MEDIAL PARTIAL KNEE REPLACEMENT Bilateral    THYROID SURGERY      Medical History: Past Medical History:  Diagnosis Date   Acute appendicitis 03/11/2016   Allergy    Allergy-induced asthma    Frequent headaches    Hypercalcemia 03/18/2019   Hyperlipidemia    Irritable bowel syndrome (IBS)    Neuropathy    Thyroid disease  Family History: Family History  Problem Relation Age of Onset   Arthritis Mother    Diabetes Mother    Heart disease Father    Breast cancer Sister    Diabetes Maternal Uncle     Social History   Socioeconomic History   Marital status: Married    Spouse name: Not on file   Number of children: Not on file   Years of education: Not on file   Highest education level: Not on file  Occupational History   Not on file  Tobacco Use   Smoking status: Never   Smokeless tobacco: Never  Vaping Use   Vaping Use: Never used  Substance and Sexual Activity   Alcohol use: No    Alcohol/week: 0.0  standard drinks of alcohol   Drug use: No   Sexual activity: Never  Other Topics Concern   Not on file  Social History Narrative   Not on file   Social Determinants of Health   Financial Resource Strain: Low Risk  (01/30/2022)   Overall Financial Resource Strain (CARDIA)    Difficulty of Paying Living Expenses: Not hard at all  Food Insecurity: No Food Insecurity (01/30/2022)   Hunger Vital Sign    Worried About Running Out of Food in the Last Year: Never true    Ran Out of Food in the Last Year: Never true  Transportation Needs: No Transportation Needs (01/30/2022)   PRAPARE - Hydrologist (Medical): No    Lack of Transportation (Non-Medical): No  Physical Activity: Insufficiently Active (01/30/2022)   Exercise Vital Sign    Days of Exercise per Week: 3 days    Minutes of Exercise per Session: 20 min  Stress: No Stress Concern Present (01/30/2022)   [place]    Feeling of Stress : Only a little  Social Connections: Socially Integrated (01/30/2022)   Social Connection and Isolation Panel [NHANES]    Frequency of Communication with Friends and Family: Twice a week    Frequency of Social Gatherings with Friends and Family: Once a week    Attends Religious Services: More than 4 times per year    Active Member of [employer] or Organizations: Yes    Attends Music therapist: More than 4 times per year    Marital Status: Married  Human resources officer Violence: Not At Risk (01/30/2022)   Humiliation, Afraid, Rape, and Kick questionnaire    Fear of Current or Ex-Partner: No    Emotionally Abused: No    Physically Abused: No    Sexually Abused: No      Review of Systems  Constitutional:  Positive for fatigue and unexpected weight change. Negative for chills.  HENT:  Positive for postnasal drip. Negative for congestion, rhinorrhea, sneezing and sore throat.   Eyes:  Negative for  redness.  Respiratory:  Negative for cough, chest tightness and shortness of breath.   Cardiovascular:  Negative for chest pain and palpitations.  Gastrointestinal: Negative.  Negative for abdominal pain, constipation, diarrhea, nausea and vomiting.  Genitourinary:  Negative for dysuria and frequency.  Musculoskeletal: Negative.  Negative for arthralgias, back pain, joint swelling and neck pain.  Skin:  Negative for rash.       Itchy nodules under the skin on the palm of her hand  Neurological: Negative.  Negative for tremors and numbness.  Hematological:  Negative for adenopathy. Does not bruise/bleed easily.  Psychiatric/Behavioral:  Positive for sleep disturbance. Negative  for behavioral problems (Depression), self-injury and suicidal ideas. The patient is nervous/anxious.     Vital Signs: BP 130/66   Pulse 65   Temp 98.4 F (36.9 C)   Resp 16   Ht 5' (1.524 m)   Wt 163 lb 12.8 oz (74.3 kg)   SpO2 97%   BMI 31.99 kg/m    Physical Exam Vitals reviewed.  Constitutional:      General: She is not in acute distress.    Appearance: Normal appearance. She is obese. She is not ill-appearing.  HENT:     Head: Normocephalic and atraumatic.     Nose: Congestion present.  Eyes:     Pupils: Pupils are equal, round, and reactive to light.  Cardiovascular:     Rate and Rhythm: Normal rate and regular rhythm.  Pulmonary:     Effort: Pulmonary effort is normal. No respiratory distress.  Neurological:     Mental Status: She is alert and oriented to person, place, and time.     Cranial Nerves: No cranial nerve deficit.     Gait: Gait normal.  Psychiatric:        Mood and Affect: Mood normal.        Behavior: Behavior normal.        Assessment/Plan: 1. Other fatigue Check epstein-barr virus  - EPSTEIN-BARR VIRUS (EBV) Antibody Profile  2. Hypothyroidism, unspecified type Endo referral for continued care and thyroid labs ordered,  - Ambulatory referral to Endocrinology -  EPSTEIN-BARR VIRUS (EBV) Antibody Profile - Thyroglobulin Level - T3, free - Thyroid peroxidase antibody  3. Primary hyperparathyroidism ([place]) Lab  ordered  - EPSTEIN-BARR VIRUS (EBV) Antibody Profile  4. Aortic atherosclerosis (HCC) Continue current medication  5. Osteopenia after menopause Dexa scan and epstein-barr virus immunoglobulin ordered.  - DG Bone Density; Future - EPSTEIN-BARR VIRUS (EBV) Antibody Profile  6. Chronic right shoulder pain Tizanidine prescrived. ` - tiZANidine (ZANAFLEX) 4 MG tablet; Take 1 tablet (4 mg total) by mouth every 6 (six) hours as needed for muscle spasms.  Dispense: 30 tablet; Refill: 2   General Counseling: Davis verbalizes understanding of the findings of todays visit and agrees with plan of treatment. I have discussed any further diagnostic evaluation that may be needed or ordered today. We also reviewed her medications today. she has been encouraged to call the office with any questions or concerns that should arise related to todays visit.    Orders Placed This Encounter  Procedures   DG Bone Density   EPSTEIN-BARR VIRUS (EBV) Antibody Profile   Thyroglobulin Level   T3, free   Thyroid peroxidase antibody   Ambulatory referral to Endocrinology    Meds ordered this encounter  Medications   tiZANidine (ZANAFLEX) 4 MG tablet    Sig: Take 1 tablet (4 mg total) by mouth every 6 (six) hours as needed for muscle spasms.    Dispense:  30 tablet    Refill:  2    Return in about 3 months (around 08/22/2022), or if symptoms worsen or fail to improve, for Larcenia Holaday PCP.   Total time spent:30 Minutes Time spent includes review of chart, medications, test results, and follow up plan with the patient.   [place] Controlled Substance Database was reviewed by me.  This patient was seen by Jonetta Osgood, FNP-C in collaboration with Dr. Clayborn Bigness as a part of collaborative care agreement.   Latressa Harries R. Valetta Fuller, MSN, FNP-C Internal medicine

## 2022-05-27 DIAGNOSIS — G4734 Idiopathic sleep related nonobstructive alveolar hypoventilation: Secondary | ICD-10-CM | POA: Diagnosis not present

## 2022-05-28 DIAGNOSIS — M19011 Primary osteoarthritis, right shoulder: Secondary | ICD-10-CM | POA: Diagnosis not present

## 2022-05-28 DIAGNOSIS — M19012 Primary osteoarthritis, left shoulder: Secondary | ICD-10-CM | POA: Diagnosis not present

## 2022-05-28 DIAGNOSIS — G8929 Other chronic pain: Secondary | ICD-10-CM | POA: Diagnosis not present

## 2022-05-29 DIAGNOSIS — E210 Primary hyperparathyroidism: Secondary | ICD-10-CM | POA: Diagnosis not present

## 2022-05-29 DIAGNOSIS — E039 Hypothyroidism, unspecified: Secondary | ICD-10-CM | POA: Diagnosis not present

## 2022-05-29 DIAGNOSIS — M8580 Other specified disorders of bone density and structure, unspecified site: Secondary | ICD-10-CM | POA: Diagnosis not present

## 2022-05-29 DIAGNOSIS — I700 Atherosclerosis of aorta: Secondary | ICD-10-CM | POA: Diagnosis not present

## 2022-05-30 LAB — THYROID PEROXIDASE ANTIBODY: Thyroperoxidase Ab SerPl-aCnc: <9 IU/mL (ref 0–34)

## 2022-06-04 LAB — T3, FREE: T3, Free: 3.5 pg/mL (ref 2.0–4.4)

## 2022-06-04 LAB — THYROGLOBULIN LEVEL: Thyroglobulin (TG-RIA): 2.8 ng/mL

## 2022-06-04 LAB — EPSTEIN-BARR VIRUS (EBV) ANTIBODY PROFILE
EBV NA IgG: 38.5 U/mL — ABNORMAL HIGH (ref 0.0–17.9)
EBV VCA IgG: 249 U/mL — ABNORMAL HIGH (ref 0.0–17.9)
EBV VCA IgM: <36 U/mL (ref 0.0–35.9)

## 2022-06-04 NOTE — Telephone Encounter (Signed)
Endocrinology referral sent via [facility] to [facility]

## 2022-06-05 ENCOUNTER — Telehealth: Payer: Self-pay

## 2022-06-06 DIAGNOSIS — M19012 Primary osteoarthritis, left shoulder: Secondary | ICD-10-CM | POA: Diagnosis not present

## 2022-06-06 DIAGNOSIS — M19011 Primary osteoarthritis, right shoulder: Secondary | ICD-10-CM | POA: Diagnosis not present

## 2022-06-17 NOTE — Telephone Encounter (Signed)
Per Boris Sharper, they are not accepting new patients at this time. Faxed referral to Dr. Rayburn Ma

## 2022-06-17 NOTE — Telephone Encounter (Signed)
error 

## 2022-06-26 DIAGNOSIS — G4734 Idiopathic sleep related nonobstructive alveolar hypoventilation: Secondary | ICD-10-CM | POA: Diagnosis not present

## 2022-07-03 ENCOUNTER — Ambulatory Visit
Admission: RE | Admit: 2022-07-03 | Discharge: 2022-07-03 | Disposition: A | Discharge status: Home / Self Care | Payer: Medicare Other | Source: Ambulatory Visit | Attending: Nurse Practitioner | Admitting: Nurse Practitioner

## 2022-07-03 DIAGNOSIS — Z780 Asymptomatic menopausal state: Secondary | ICD-10-CM | POA: Diagnosis not present

## 2022-07-03 DIAGNOSIS — M8580 Other specified disorders of bone density and structure, unspecified site: Secondary | ICD-10-CM | POA: Insufficient documentation

## 2022-07-03 DIAGNOSIS — M810 Age-related osteoporosis without current pathological fracture: Secondary | ICD-10-CM | POA: Diagnosis not present

## 2022-07-24 ENCOUNTER — Ambulatory Visit: Payer: Medicare Other | Admitting: Internal Medicine

## 2022-07-24 DIAGNOSIS — L578 Other skin changes due to chronic exposure to nonionizing radiation: Secondary | ICD-10-CM | POA: Diagnosis not present

## 2022-07-24 DIAGNOSIS — Z872 Personal history of diseases of the skin and subcutaneous tissue: Secondary | ICD-10-CM | POA: Diagnosis not present

## 2022-07-24 DIAGNOSIS — L821 Other seborrheic keratosis: Secondary | ICD-10-CM | POA: Diagnosis not present

## 2022-07-24 DIAGNOSIS — Z85828 Personal history of other malignant neoplasm of skin: Secondary | ICD-10-CM | POA: Diagnosis not present

## 2022-07-24 DIAGNOSIS — L720 Epidermal cyst: Secondary | ICD-10-CM | POA: Diagnosis not present

## 2022-07-25 ENCOUNTER — Encounter: Payer: Self-pay | Admitting: Nurse Practitioner

## 2022-07-27 DIAGNOSIS — G4734 Idiopathic sleep related nonobstructive alveolar hypoventilation: Secondary | ICD-10-CM | POA: Diagnosis not present

## 2022-08-05 ENCOUNTER — Encounter: Payer: Self-pay | Admitting: Internal Medicine

## 2022-08-05 ENCOUNTER — Ambulatory Visit (INDEPENDENT_AMBULATORY_CARE_PROVIDER_SITE_OTHER): Payer: Medicare Other | Admitting: Internal Medicine

## 2022-08-05 VITALS — BP 118/72 | HR 66 | Temp 97.9°F | Resp 16 | Ht 60.0 in | Wt 166.4 lb

## 2022-08-05 DIAGNOSIS — I7090 Unspecified atherosclerosis: Secondary | ICD-10-CM | POA: Diagnosis not present

## 2022-08-05 DIAGNOSIS — M810 Age-related osteoporosis without current pathological fracture: Secondary | ICD-10-CM | POA: Diagnosis not present

## 2022-08-05 DIAGNOSIS — F411 Generalized anxiety disorder: Secondary | ICD-10-CM

## 2022-08-05 MED ORDER — ESCITALOPRAM OXALATE 10 MG PO TABS: 10.0000 mg | ORAL_TABLET | Freq: Every day | ORAL | 1 refills | Status: DC

## 2022-08-05 MED ORDER — IBANDRONATE SODIUM 150 MG PO TABS: ORAL_TABLET | ORAL | 3 refills | Status: DC

## 2022-08-05 NOTE — Progress Notes (Signed)
[facility] [place], [place] [address]  Internal MEDICINE  Office Visit Note  Patient Name: Danielle Nash  [id number]  [id number]  Date of Service: 08/05/2022  Chief Complaint  Patient presents with   Results    Review Bone Density    HPI The patient is here to discuss the results of her bone density 1.  BMD is abnormal with T score of about 2.5 patient does have severe osteoporosis, she is reluctant to try any medication because she thinks that it has given cancer to one of her friends, she just started taking calcium and vitamin D 2.  She is also on Maxalt for chronic migraine headaches 3.  Patient has a history of aortic atherosclerosis on radiological study, does have elevated/abnormal lipid profile, has not been treated with statin 4.  Patient gives history of anxiety, remains anxious most of the time, none of the medications work for her including Lunesta and Xanax 5.  She is willing to try medicine for anxiety 6. She takes multiple oral supplements as well    Current Medication: Outpatient Encounter Medications as of 08/05/2022  Medication Sig   B Complex Vitamins (VITAMIN-B COMPLEX) TABS Take 1 tablet by mouth 1 day or 1 dose.   Cholecalciferol (VITAMIN D) 2000 units tablet Take 4,000 Units by mouth 1 day or 1 dose.   dicyclomine (BENTYL) 10 MG capsule Take 1 capsule (10 mg total) by mouth 4 (four) times daily -  before meals and at bedtime.   escitalopram (LEXAPRO) 10 MG tablet Take 1 tablet (10 mg total) by mouth daily.   fexofenadine (ALLEGRA ALLERGY) 180 MG tablet Take 1 tablet (180 mg total) by mouth daily.   fluticasone (FLONASE) 50 MCG/ACT nasal spray Place into both nostrils daily.   ibandronate (BONIVA) 150 MG tablet Take in the morning with a full glass of water, on an empty stomach, and do not take anything else by mouth or lie down for the next 30 min.   lidocaine (LIDODERM) 5 % Place 1 patch onto the skin daily. Remove & Discard patch  within 12 hours or as directed by MD   MAGNESIUM PO Take 1 tablet by mouth 1 day or 1 dose.   mometasone (NASONEX) 50 MCG/ACT nasal spray Place 2 sprays into the nose daily.   Probiotic Product (PROBIOTIC & ACIDOPHILUS EX ST) CAPS Take 1 tablet by mouth 1 day or 1 dose.   tiZANidine (ZANAFLEX) 4 MG tablet Take 1 tablet (4 mg total) by mouth every 6 (six) hours as needed for muscle spasms.   triamcinolone cream (KENALOG) 0.1 % Apply 1 application topically 2 (two) times daily.   vitamin B-12 (CYANOCOBALAMIN) 1000 MCG tablet Take 1 tablet by mouth 1 day or 1 dose.   vitamin E 400 UNIT capsule Take 1 capsule by mouth 1 day or 1 dose.   [DISCONTINUED] eszopiclone (LUNESTA) 1 MG TABS tablet Take 1-2 tablets (1-2 mg total) by mouth at bedtime as needed for sleep. Take immediately before bedtime   [DISCONTINUED] hydrOXYzine (ATARAX) 25 MG tablet Take 0.5-1 tablets (12.5-25 mg total) by mouth every 8 (eight) hours as needed for itching or anxiety.   [DISCONTINUED] rizatriptan (MAXALT) 10 MG tablet Take 1 tablet (10 mg total) by mouth as needed for migraine. May repeat in 2 hours if needed   No facility-administered encounter medications on file as of 08/05/2022.    Surgical History: Past Surgical History:  Procedure Laterality Date   INCISIONAL HERNIA REPAIR  N/A 09/05/2017   Procedure: HERNIA REPAIR INCISIONAL;  Surgeon: Vickie Epley, MD;  Location: [facility] ORS;  Service: General;  Laterality: N/A;   LAPAROSCOPIC APPENDECTOMY N/A 10/19/2016   Procedure: APPENDECTOMY LAPAROSCOPIC;  Surgeon: Dia Crawford III, MD;  Location: [facility] ORS;  Service: General;  Laterality: N/A;   MEDIAL PARTIAL KNEE REPLACEMENT Bilateral    THYROID SURGERY      Medical History: Past Medical History:  Diagnosis Date   Acute appendicitis 03/11/2016   Allergy    Allergy-induced asthma    Frequent headaches    Hypercalcemia 03/18/2019   Hyperlipidemia    Irritable bowel syndrome (IBS)    Neuropathy    Thyroid disease      Family History: Family History  Problem Relation Age of Onset   Arthritis Mother    Diabetes Mother    Heart disease Father    Breast cancer Sister    Diabetes Maternal Uncle     Social History   Socioeconomic History   Marital status: Married    Spouse name: Not on file   Number of children: Not on file   Years of education: Not on file   Highest education level: Not on file  Occupational History   Not on file  Tobacco Use   Smoking status: Never   Smokeless tobacco: Never  Vaping Use   Vaping Use: Never used  Substance and Sexual Activity   Alcohol use: No    Alcohol/week: 0.0 standard drinks of alcohol   Drug use: No   Sexual activity: Never  Other Topics Concern   Not on file  Social History Narrative   Not on file   Social Determinants of Health   Financial Resource Strain: Low Risk  (01/30/2022)   Overall Financial Resource Strain (CARDIA)    Difficulty of Paying Living Expenses: Not hard at all  Food Insecurity: No Food Insecurity (01/30/2022)   Hunger Vital Sign    Worried About Running Out of Food in the Last Year: Never true    Ran Out of Food in the Last Year: Never true  Transportation Needs: No Transportation Needs (01/30/2022)   PRAPARE - Hydrologist (Medical): No    Lack of Transportation (Non-Medical): No  Physical Activity: Insufficiently Active (01/30/2022)   Exercise Vital Sign    Days of Exercise per Week: 3 days    Minutes of Exercise per Session: 20 min  Stress: No Stress Concern Present (01/30/2022)   [place]    Feeling of Stress : Only a little  Social Connections: Socially Integrated (01/30/2022)   Social Connection and Isolation Panel [NHANES]    Frequency of Communication with Friends and Family: Twice a week    Frequency of Social Gatherings with Friends and Family: Once a week    Attends Religious Services: More than 4 times per year     Active Member of [employer] or Organizations: Yes    Attends Archivist Meetings: More than 4 times per year    Marital Status: Married  Human resources officer Violence: Not At Risk (01/30/2022)   Humiliation, Afraid, Rape, and Kick questionnaire    Fear of Current or Ex-Partner: No    Emotionally Abused: No    Physically Abused: No    Sexually Abused: No      Review of Systems  Constitutional:  Positive for fatigue. Negative for fever.  HENT:  Positive for rhinorrhea. Negative for congestion, mouth sores  and postnasal drip.   Respiratory:  Negative for cough.   Cardiovascular:  Negative for chest pain.  Genitourinary:  Negative for flank pain.  Allergic/Immunologic: Positive for environmental allergies.  Neurological: Negative.   Psychiatric/Behavioral:  Positive for sleep disturbance. The patient is nervous/anxious.     Vital Signs: BP 118/72   Pulse 66   Temp 97.9 F (36.6 C)   Resp 16   Ht 5' (1.524 m)   Wt 166 lb 6.4 oz (75.5 kg)   SpO2 97%   BMI 32.50 kg/m    Physical Exam Constitutional:      Appearance: Normal appearance.  HENT:     Head: Normocephalic and atraumatic.     Nose: Nose normal.     Mouth/Throat:     Mouth: Mucous membranes are moist.     Pharynx: No posterior oropharyngeal erythema.  Eyes:     Extraocular Movements: Extraocular movements intact.     Pupils: Pupils are equal, round, and reactive to light.  Cardiovascular:     Pulses: Normal pulses.     Heart sounds: Normal heart sounds.  Pulmonary:     Effort: Pulmonary effort is normal.     Breath sounds: Normal breath sounds.  Neurological:     General: No focal deficit present.     Mental Status: She is alert.  Psychiatric:        Mood and Affect: Mood normal.        Behavior: Behavior normal.        Assessment/Plan: 1. Atherosclerosis Pt with h/o atherosclerosis, needs monitoring, has headaches, dizziness at times as well, abnormal lipid profile without treatment, Hold  Maxalt for now  - US Carotid Bilateral; Future  2. Senile osteoporosis Pt is willing to try therapy, she will continue to take calcium and vit d  - ibandronate (BONIVA) 150 MG tablet; Take in the morning with a full glass of water, on an empty stomach, and do not take anything else by mouth or lie down for the next 30 min.  Dispense: 3 tablet; Refill: 3  3. GAD (generalized anxiety disorder) Symptoms of GAD ?? Hypomania , will monitor, pressure speech , denial of treatment due to excessive fear of side effects  - escitalopram (LEXAPRO) 10 MG tablet; Take 1 tablet (10 mg total) by mouth daily.  Dispense: 30 tablet; Refill: 1   General Counseling: Merrell verbalizes understanding of the findings of todays visit and agrees with plan of treatment. I have discussed any further diagnostic evaluation that may be needed or ordered today. We also reviewed her medications today. she has been encouraged to call the office with any questions or concerns that should arise related to todays visit.    Orders Placed This Encounter  Procedures   US Carotid Bilateral    Meds ordered this encounter  Medications   escitalopram (LEXAPRO) 10 MG tablet    Sig: Take 1 tablet (10 mg total) by mouth daily.    Dispense:  30 tablet    Refill:  1   ibandronate (BONIVA) 150 MG tablet    Sig: Take in the morning with a full glass of water, on an empty stomach, and do not take anything else by mouth or lie down for the next 30 min.    Dispense:  3 tablet    Refill:  3    Total time spent:35 Minutes Time spent includes review of chart, medications, test results, and follow up plan with the patient.   [place] Controlled Substance  Database was reviewed by me.   Dr Lavera Guise Internal medicine

## 2022-08-07 ENCOUNTER — Telehealth: Payer: Self-pay

## 2022-08-07 NOTE — Telephone Encounter (Signed)
Pt called to confirm taking '150mg'$  of Boniva per month was the correct dosage. Alyssa approved 1 tablet per month is okay.

## 2022-08-27 DIAGNOSIS — G4734 Idiopathic sleep related nonobstructive alveolar hypoventilation: Secondary | ICD-10-CM | POA: Diagnosis not present

## 2022-09-04 ENCOUNTER — Ambulatory Visit: Payer: Medicare Other | Admitting: Nurse Practitioner

## 2022-09-09 ENCOUNTER — Other Ambulatory Visit: Payer: Medicare Other

## 2022-09-18 ENCOUNTER — Ambulatory Visit: Payer: Medicare Other | Admitting: Nurse Practitioner

## 2022-09-23 ENCOUNTER — Ambulatory Visit (INDEPENDENT_AMBULATORY_CARE_PROVIDER_SITE_OTHER): Payer: Medicare Other

## 2022-09-23 DIAGNOSIS — I7090 Unspecified atherosclerosis: Secondary | ICD-10-CM

## 2022-09-26 DIAGNOSIS — G4734 Idiopathic sleep related nonobstructive alveolar hypoventilation: Secondary | ICD-10-CM | POA: Diagnosis not present

## 2022-10-02 ENCOUNTER — Ambulatory Visit (INDEPENDENT_AMBULATORY_CARE_PROVIDER_SITE_OTHER): Payer: Medicare Other | Admitting: Nurse Practitioner

## 2022-10-02 ENCOUNTER — Encounter: Payer: Self-pay | Admitting: Nurse Practitioner

## 2022-10-02 VITALS — BP 101/79 | HR 66 | Temp 97.8°F | Resp 16 | Ht 60.0 in | Wt 168.8 lb

## 2022-10-02 DIAGNOSIS — Z5320 Procedure and treatment not carried out because of patient's decision for unspecified reasons: Secondary | ICD-10-CM

## 2022-10-02 DIAGNOSIS — F411 Generalized anxiety disorder: Secondary | ICD-10-CM | POA: Diagnosis not present

## 2022-10-02 DIAGNOSIS — I700 Atherosclerosis of aorta: Secondary | ICD-10-CM | POA: Diagnosis not present

## 2022-10-02 DIAGNOSIS — M810 Age-related osteoporosis without current pathological fracture: Secondary | ICD-10-CM

## 2022-10-02 NOTE — Progress Notes (Signed)
[facility] [place], [place] [address]  Internal MEDICINE  Office Visit Note  Patient Name: Danielle Nash  [id number]  [id number]  Date of Service: 10/02/2022  Chief Complaint  Patient presents with   Follow-up   Hyperlipidemia    HPI Danielle Nash presents for a follow up visit for medication review, carotid ultrasound and high cholesterol. --taking multiple supplements including collagen peptide,vitamin D with calcium and K2, and a supplement called colostrum for high cholesterol. --previously started on boniva, took dose twice, reports that this medication made her bones hurt and caused severe flu-like symptoms.  --was also given lexapro for anxiety but this medication made her feel sick and nauseated. Does not want to take lexapro anymore and is trying to decrease anxiety naturally, without medication --due to atherosclerosis and elevated cholesterol levels, statins have been highly recommended. She is aware of this but has declined statin therapy and is not interested in taking these medications due to their possible side effects and what a family member has experienced while taking this medication.      Current Medication: Outpatient Encounter Medications as of 10/02/2022  Medication Sig   B Complex Vitamins (VITAMIN-B COMPLEX) TABS Take 1 tablet by mouth 1 day or 1 dose.   Cholecalciferol (VITAMIN D) 2000 units tablet Take 4,000 Units by mouth 1 day or 1 dose.   dicyclomine (BENTYL) 10 MG capsule Take 1 capsule (10 mg total) by mouth 4 (four) times daily -  before meals and at bedtime.   fexofenadine (ALLEGRA ALLERGY) 180 MG tablet Take 1 tablet (180 mg total) by mouth daily.   fluticasone (FLONASE) 50 MCG/ACT nasal spray Place into both nostrils daily.   lidocaine (LIDODERM) 5 % Place 1 patch onto the skin daily. Remove & Discard patch within 12 hours or as directed by MD   MAGNESIUM PO Take 1 tablet by mouth 1 day or 1 dose.   mometasone (NASONEX) 50  MCG/ACT nasal spray Place 2 sprays into the nose daily.   Probiotic Product (PROBIOTIC & ACIDOPHILUS EX ST) CAPS Take 1 tablet by mouth 1 day or 1 dose.   tiZANidine (ZANAFLEX) 4 MG tablet Take 1 tablet (4 mg total) by mouth every 6 (six) hours as needed for muscle spasms.   triamcinolone cream (KENALOG) 0.1 % Apply 1 application topically 2 (two) times daily.   vitamin B-12 (CYANOCOBALAMIN) 1000 MCG tablet Take 1 tablet by mouth 1 day or 1 dose.   vitamin E 400 UNIT capsule Take 1 capsule by mouth 1 day or 1 dose.   [DISCONTINUED] escitalopram (LEXAPRO) 10 MG tablet Take 1 tablet (10 mg total) by mouth daily.   [DISCONTINUED] ibandronate (BONIVA) 150 MG tablet Take in the morning with a full glass of water, on an empty stomach, and do not take anything else by mouth or lie down for the next 30 min.   No facility-administered encounter medications on file as of 10/02/2022.    Surgical History: Past Surgical History:  Procedure Laterality Date   INCISIONAL HERNIA REPAIR N/A 09/05/2017   Procedure: HERNIA REPAIR INCISIONAL;  Surgeon: Vickie Epley, MD;  Location: [facility] ORS;  Service: General;  Laterality: N/A;   LAPAROSCOPIC APPENDECTOMY N/A 10/19/2016   Procedure: APPENDECTOMY LAPAROSCOPIC;  Surgeon: Dia Crawford III, MD;  Location: [facility] ORS;  Service: General;  Laterality: N/A;   MEDIAL PARTIAL KNEE REPLACEMENT Bilateral    THYROID SURGERY      Medical History: Past Medical History:  Diagnosis Date  Acute appendicitis 03/11/2016   Allergy    Allergy-induced asthma    Frequent headaches    Hypercalcemia 03/18/2019   Hyperlipidemia    Irritable bowel syndrome (IBS)    Neuropathy    Thyroid disease     Family History: Family History  Problem Relation Age of Onset   Arthritis Mother    Diabetes Mother    Heart disease Father    Breast cancer Sister    Diabetes Maternal Uncle     Social History   Socioeconomic History   Marital status: Married    Spouse name: Not on file    Number of children: Not on file   Years of education: Not on file   Highest education level: Not on file  Occupational History   Not on file  Tobacco Use   Smoking status: Never   Smokeless tobacco: Never  Vaping Use   Vaping Use: Never used  Substance and Sexual Activity   Alcohol use: No    Alcohol/week: 0.0 standard drinks of alcohol   Drug use: No   Sexual activity: Never  Other Topics Concern   Not on file  Social History Narrative   Not on file   Social Determinants of Health   Financial Resource Strain: Low Risk  (01/30/2022)   Overall Financial Resource Strain (CARDIA)    Difficulty of Paying Living Expenses: Not hard at all  Food Insecurity: No Food Insecurity (01/30/2022)   Hunger Vital Sign    Worried About Running Out of Food in the Last Year: Never true    Ran Out of Food in the Last Year: Never true  Transportation Needs: No Transportation Needs (01/30/2022)   PRAPARE - Hydrologist (Medical): No    Lack of Transportation (Non-Medical): No  Physical Activity: Insufficiently Active (01/30/2022)   Exercise Vital Sign    Days of Exercise per Week: 3 days    Minutes of Exercise per Session: 20 min  Stress: No Stress Concern Present (01/30/2022)   [place]    Feeling of Stress : Only a little  Social Connections: Socially Integrated (01/30/2022)   Social Connection and Isolation Panel [NHANES]    Frequency of Communication with Friends and Family: Twice a week    Frequency of Social Gatherings with Friends and Family: Once a week    Attends Religious Services: More than 4 times per year    Active Member of [employer] or Organizations: Yes    Attends Archivist Meetings: More than 4 times per year    Marital Status: Married  Human resources officer Violence: Not At Risk (01/30/2022)   Humiliation, Afraid, Rape, and Kick questionnaire    Fear of Current or Ex-Partner: No     Emotionally Abused: No    Physically Abused: No    Sexually Abused: No      Review of Systems  Constitutional:  Positive for fatigue. Negative for fever.  HENT:  Positive for rhinorrhea. Negative for congestion, mouth sores and postnasal drip.   Respiratory:  Negative for cough.   Cardiovascular:  Negative for chest pain.  Genitourinary:  Negative for flank pain.  Allergic/Immunologic: Positive for environmental allergies.  Neurological: Negative.   Psychiatric/Behavioral:  Positive for sleep disturbance. The patient is nervous/anxious.     Vital Signs: BP 101/79   Pulse 66   Temp 97.8 F (36.6 C)   Resp 16   Ht 5' (1.524 m)   Wt  168 lb 12.8 oz (76.6 kg)   SpO2 96%   BMI 32.97 kg/m    Physical Exam Vitals reviewed.  Constitutional:      General: She is not in acute distress.    Appearance: Normal appearance. She is obese. She is not ill-appearing.  HENT:     Head: Normocephalic and atraumatic.     Nose: Congestion present.  Eyes:     Pupils: Pupils are equal, round, and reactive to light.  Cardiovascular:     Rate and Rhythm: Normal rate and regular rhythm.  Pulmonary:     Effort: Pulmonary effort is normal. No respiratory distress.  Neurological:     Mental Status: She is alert and oriented to person, place, and time.     Cranial Nerves: No cranial nerve deficit.     Gait: Gait normal.  Psychiatric:        Mood and Affect: Mood normal.        Behavior: Behavior normal.        Assessment/Plan: 1. Senile osteoporosis Boniva discontinued per patient preference due to intolerable adverse side effects. Continue calcium +vitamin d supplement and weight bearing exercises  2. Aortic atherosclerosis (HCC) Taking OTC supplement for cholesterol. Declined statin therapy, is not interested in taking medication for cholesterol at this time.   3. Statin declined Patient not interested and has been approached about statin therapy multiple times.   4. GAD  (generalized anxiety disorder) Lexapro discontinued. Patient will find OTC supplement that can help decrease her anxiety or some self-care activities that can relieve anxiety.    General Counseling: Danielle Nash understanding of the findings of todays visit and agrees with plan of treatment. I have discussed any further diagnostic evaluation that may be needed or ordered today. We also reviewed her medications today. she has been encouraged to call the office with any questions or concerns that should arise related to todays visit.    No orders of the defined types were placed in this encounter.   No orders of the defined types were placed in this encounter.   Return for CPE, [facility] PCP due in mid to late february 2024.   Total time spent:30 Minutes Time spent includes review of chart, medications, test results, and follow up plan with the patient.   [place] Controlled Substance Database was reviewed by me.  This patient was seen by Jonetta Osgood, FNP-C in collaboration with Dr. Clayborn Bigness as a part of collaborative care agreement.   Antoine Vandermeulen R. Valetta Fuller, MSN, FNP-C Internal medicine

## 2022-10-27 DIAGNOSIS — G4734 Idiopathic sleep related nonobstructive alveolar hypoventilation: Secondary | ICD-10-CM | POA: Diagnosis not present

## 2022-11-26 DIAGNOSIS — G4734 Idiopathic sleep related nonobstructive alveolar hypoventilation: Secondary | ICD-10-CM | POA: Diagnosis not present

## 2022-11-28 ENCOUNTER — Ambulatory Visit: Payer: Self-pay

## 2022-11-28 NOTE — Telephone Encounter (Signed)
  Chief Complaint: nausea Symptoms: nausea vomiting or diarrhea  Frequency: 1 week  Pertinent Negatives: NA Disposition: '[]'$ ED /'[]'$ Urgent Care (no appt availability in office) / '[x]'$ Appointment(In office/virtual)/ '[]'$  [facility]/ '[]'$ Home Care/ '[]'$ Refused Recommended Disposition /'[]'$ [facility]/ '[]'$  Follow-up with PCP Additional Notes: pt states that she has been having periods where she is sick for a day and then itll get better and then come back. Pt last episode was yesterday. Pt unsure what causing sx. She is wanting to find new PCP for Senior care but advised only Senior care is in [place]. Pt stated that is too far for her to drive. Advised to call PCP to get appt for acute sx and if wanted to schedule [facility] at [facility] she can call back. Offered to go ahead and schedule but pt wants to call back.   Reason for Disposition  Nausea lasts > 1 week  Answer Assessment - Initial Assessment Questions 1. NAUSEA SEVERITY: "How bad is the nausea?" (e.g., mild, moderate, severe; dehydration, weight loss)   - MILD: loss of appetite without change in eating habits   - MODERATE: decreased oral intake without significant weight loss, dehydration, or malnutrition   - SEVERE: inadequate caloric or fluid intake, significant weight loss, symptoms of dehydration     Mild to moderate  2. ONSET: "When did the nausea begin?"     Last week  3. VOMITING: "Any vomiting?" If Yes, ask: "How many times today?"     Yes unsure can't remember   Having diarrhea and vomiting and nausea and then sick and next day or so feels fine.  Protocols used: Nausea-A-AH

## 2022-12-26 NOTE — Progress Notes (Signed)
Referring Physician:  Jonetta Osgood, NP [place],  [place] [id number]  Primary Physician:  Jonetta Osgood, NP  History of Present Illness: 12/30/2022 Ms. Danielle Nash has a history of osteoporosis, high cholesterol, GAD.   She has chronic constant diffuse back pain from her neck into her lower back x years. Her primary complaint is lower back pain, right > left. She has intermittent pain in her legs with known neuropathy. Pain is worse in the morning and gets better as she moves around. Pain is also worse after she gets from prolonged sitting. No radicular arm pain. She has known bilateral shoulder issues.   She has seen PMR ([facility]) in the past for her lower back- she states she had nausea, vomiting, and diarrhea after these. Did see some relief with PT years ago, she continues to do her HEP.    Conservative measures:  Physical therapy: did not help back in 2016 Multimodal medical therapy including regular antiinflammatories: mobic, flexeril, zanaflex, ultram  Injections:  04/07/2015: Bilateral L4-5 transforaminal ESI (no relief) 03/17/2015: Right L4-5 transforaminal ESI (relief x1 week)   Past Surgery: no spinal surgery  Danielle Nash has no symptoms of cervical myelopathy.  The symptoms are causing a significant impact on the patient's life.   Review of Systems:  A 10 point review of systems is negative, except for the pertinent positives and negatives detailed in the HPI.  Past Medical History: Past Medical History:  Diagnosis Date   Acute appendicitis 03/11/2016   Allergy    Allergy-induced asthma    Frequent headaches    Hypercalcemia 03/18/2019   Hyperlipidemia    Irritable bowel syndrome (IBS)    Neuropathy    Thyroid disease     Past Surgical History: Past Surgical History:  Procedure Laterality Date   INCISIONAL HERNIA REPAIR N/A 09/05/2017   Procedure: HERNIA REPAIR INCISIONAL;  Surgeon: Vickie Epley, MD;  Location: [facility] ORS;   Service: General;  Laterality: N/A;   LAPAROSCOPIC APPENDECTOMY N/A 10/19/2016   Procedure: APPENDECTOMY LAPAROSCOPIC;  Surgeon: Dia Crawford III, MD;  Location: [facility] ORS;  Service: General;  Laterality: N/A;   MEDIAL PARTIAL KNEE REPLACEMENT Bilateral    THYROID SURGERY      Allergies: Allergies as of 12/30/2022 - Review Complete 12/30/2022  Allergen Reaction Noted   Levofloxacin Other (See Comments) 03/02/2019   Azithromycin Other (See Comments) 09/14/2021   Erythromycin Other (See Comments) 03/11/2016   Sulfa antibiotics Other (See Comments) 02/63/7858   Tricyclic antidepressants  12/30/2022    Medications: Outpatient Encounter Medications as of 12/30/2022  Medication Sig   Ascorbic Acid (VITAMIN C PO) Take by mouth daily.   FOLIC ACID PO Take by mouth daily.   IODINE-POTASSIUM IODIDE PO Take by mouth daily.   Multiple Vitamin (MULTIVITAMIN) capsule Take 1 capsule by mouth daily.   B Complex Vitamins (VITAMIN-B COMPLEX) TABS Take 1 tablet by mouth 1 day or 1 dose.   Cholecalciferol (VITAMIN D) 2000 units tablet Take 4,000 Units by mouth 1 day or 1 dose.   dicyclomine (BENTYL) 10 MG capsule Take 1 capsule (10 mg total) by mouth 4 (four) times daily -  before meals and at bedtime.   fluticasone (FLONASE) 50 MCG/ACT nasal spray Place into both nostrils daily.   lidocaine (LIDODERM) 5 % Place 1 patch onto the skin daily. Remove & Discard patch within 12 hours or as directed by MD   MAGNESIUM PO Take 1 tablet by mouth 1 day or 1 dose.  Probiotic Product (PROBIOTIC & ACIDOPHILUS EX ST) CAPS Take 1 tablet by mouth 1 day or 1 dose.   tiZANidine (ZANAFLEX) 4 MG tablet Take 1 tablet (4 mg total) by mouth every 6 (six) hours as needed for muscle spasms.   triamcinolone cream (KENALOG) 0.1 % Apply 1 application topically 2 (two) times daily.   vitamin B-12 (CYANOCOBALAMIN) 1000 MCG tablet Take 1 tablet by mouth 1 day or 1 dose.   vitamin E 400 UNIT capsule Take 1 capsule by mouth 1 day or 1  dose.   [DISCONTINUED] fexofenadine (ALLEGRA ALLERGY) 180 MG tablet Take 1 tablet (180 mg total) by mouth daily.   [DISCONTINUED] mometasone (NASONEX) 50 MCG/ACT nasal spray Place 2 sprays into the nose daily.   No facility-administered encounter medications on file as of 12/30/2022.    Social History: Social History   Tobacco Use   Smoking status: Never   Smokeless tobacco: Never  Vaping Use   Vaping Use: Never used  Substance Use Topics   Alcohol use: No    Alcohol/week: 0.0 standard drinks of alcohol   Drug use: No    Family Medical History: Family History  Problem Relation Age of Onset   Arthritis Mother    Diabetes Mother    Heart disease Father    Breast cancer Sister    Diabetes Maternal Uncle     Physical Examination: Vitals:   12/30/22 1022  BP: (!) 168/94  Pulse: (!) 52    General: Patient is well developed, well nourished, calm, collected, and in no apparent distress. Attention to examination is appropriate.  Respiratory: Patient is breathing without any difficulty.   NEUROLOGICAL:     Awake, alert, oriented to person, place, and time.  Speech is clear and fluent. Fund of knowledge is appropriate.   Cranial Nerves: Pupils equal round and reactive to light.  Facial tone is symmetric.    No significant cervical or thoracic tenderness.   She has mild diffuse lower lumbar tenderness, right > left.   No abnormal lesions on exposed skin.   Strength: Side Biceps Triceps Deltoid Interossei Grip Wrist Ext. Wrist Flex.  R '5 5 5 5 5 5 5  '$ L '5 5 5 5 5 5 5   '$ Side Iliopsoas Quads Hamstring PF DF EHL  R '5 5 5 5 5 5  '$ L '5 5 5 5 5 5   '$ Reflexes are 2+ and symmetric at the biceps, triceps, brachioradialis, patella and achilles.   Hoffman's is absent.  Clonus is not present.   Bilateral upper and lower extremity sensation is intact to light touch.     Gait is normal.    Medical Decision Making  Imaging: Lumbar MRI dated 12/26/14:  FINDINGS:  S1 is  transitional.    There is no significant finding at T11-12, T12-L1 or L1-2. The  distal cord and conus are normal with the conus tip at the L2-3 disc  level.    L2-3: Desiccation and shallow protrusion of disc material. No  compressive stenosis. Benign-appearing hemangioma within the right  side of the L3 vertebral body.    L3-4: Retrolisthesis of 3 mm. Disc degeneration with loss of disc  height and shallow protrusion of disc material. Mild facet and  ligamentous hypertrophy. Mild stenosis of the canal without definite  neural compression. Discogenic edema within the endplates that could  be associated with back pain.    L4-5: Mild facet degeneration with 1 mm of anterolisthesis. Mild  bulging of the disc. No  compressive stenosis of the canal or  foramina.    L5-S1: Bilateral facet degeneration with 2 mm of anterolisthesis.  Mild bulging of the disc. No apparent compressive stenosis.    S1-2:  Transitional and unremarkable.    IMPRESSION:  S1 is transitional.    Degenerative spondylosis at L3-4. Retrolisthesis of 2 mm. Disc  degeneration with bulging of the disc. Mild facet and ligamentous  hypertrophy. Mild multifactorial stenosis. Discogenic changes of the  endplates with edema, a finding that can be correlated with back  pain.      Electronically Signed    By: Nelson Chimes M.D.    On: 12/26/2014 15:34      I have personally reviewed the images and agree with the above interpretation.  Assessment and Plan: Ms. Oneil is a pleasant 72 y.o. female has chronic constant diffuse back pain from her neck into her lower back x years.   Her primary complaint is lower back pain, right > left. She has intermittent pain in her legs with known neuropathy. Pain is worse in the morning and gets better as she moves around. Pain is also worse after she gets from prolonged sitting.   MRI from 2016 showed retrolisthesis L3-L4, slip at L4-L5 and L5-S1 along with lumbar spondylosis and  DDD.   Treatment options discussed with patient and following plan made:   - MRI of lumbar spine to further evaluate LBP and lumbar radiculopathy. No improvement with time, medications (zanaflex), or HEP from PT.  - She does not want to have any additional injections done.  - We discussed possible PT, will revisit once I get her MRI results back.  - She would like to have MRIs of her shoulders done at the same time she has lumbar MRI. Message to ortho [facility] [place]) regarding this.  - Will set up phone visit to review her MRI results once I have them back.   I spent a total of 40 minutes in face-to-face and non-face-to-face activities related to this patient's care today including review of outside records, review of imaging, review of symptoms, physical exam, discussion of differential diagnosis, discussion of treatment options, and documentation.   Thank you for involving me in the care of this patient.   Geronimo Boot PA-C Dept. of Neurosurgery

## 2022-12-27 DIAGNOSIS — G4734 Idiopathic sleep related nonobstructive alveolar hypoventilation: Secondary | ICD-10-CM | POA: Diagnosis not present

## 2022-12-30 ENCOUNTER — Ambulatory Visit (INDEPENDENT_AMBULATORY_CARE_PROVIDER_SITE_OTHER): Payer: 59 | Admitting: Orthopedic Surgery

## 2022-12-30 ENCOUNTER — Encounter: Payer: Self-pay | Admitting: Orthopedic Surgery

## 2022-12-30 VITALS — BP 134/84 | HR 52 | Ht 60.0 in | Wt 171.0 lb

## 2022-12-30 DIAGNOSIS — M47816 Spondylosis without myelopathy or radiculopathy, lumbar region: Secondary | ICD-10-CM

## 2022-12-30 DIAGNOSIS — M4316 Spondylolisthesis, lumbar region: Secondary | ICD-10-CM | POA: Diagnosis not present

## 2022-12-30 DIAGNOSIS — M4726 Other spondylosis with radiculopathy, lumbar region: Secondary | ICD-10-CM | POA: Diagnosis not present

## 2022-12-30 DIAGNOSIS — M5136 Other intervertebral disc degeneration, lumbar region: Secondary | ICD-10-CM

## 2022-12-30 DIAGNOSIS — M5416 Radiculopathy, lumbar region: Secondary | ICD-10-CM

## 2022-12-30 NOTE — Patient Instructions (Addendum)
It was so nice to see you today, I am sorry that you are hurting so much.   Your previous lumbar imaging in 2016 showed wear and tear (arthritis) in your back. This is likely causing your back and may be causing your leg pain.   I want to get an MRI of your lumbar spine to look into things further. We will get approved through your insurance and [facility] will call you to schedule.   I sent a message to [facility] about getting MRIs of your shoulders.   Once I get the MRI results back, we will call you to set up a phone visit for [place] to review them together.   Please do not hesitate to call if you have any questions or concerns. You can also message me in [place].   If you have not heard back about the MRI scan in the next week, please call the office so we can help you get it scheduled.   Geronimo Boot PA-C [phone]

## 2022-12-31 ENCOUNTER — Other Ambulatory Visit: Payer: Self-pay | Admitting: Student

## 2022-12-31 DIAGNOSIS — M25511 Pain in right shoulder: Secondary | ICD-10-CM

## 2022-12-31 DIAGNOSIS — G8929 Other chronic pain: Secondary | ICD-10-CM

## 2023-01-10 ENCOUNTER — Ambulatory Visit: Payer: Medicare Other

## 2023-01-13 ENCOUNTER — Ambulatory Visit
Admission: RE | Admit: 2023-01-13 | Discharge: 2023-01-13 | Disposition: A | Discharge status: Home / Self Care | Payer: 59 | Source: Ambulatory Visit | Attending: Student | Admitting: Student

## 2023-01-13 ENCOUNTER — Ambulatory Visit
Admission: RE | Admit: 2023-01-13 | Discharge: 2023-01-13 | Disposition: A | Discharge status: Home / Self Care | Payer: 59 | Source: Ambulatory Visit | Attending: Orthopedic Surgery | Admitting: Orthopedic Surgery

## 2023-01-13 DIAGNOSIS — M25411 Effusion, right shoulder: Secondary | ICD-10-CM | POA: Diagnosis not present

## 2023-01-13 DIAGNOSIS — M5136 Other intervertebral disc degeneration, lumbar region: Secondary | ICD-10-CM

## 2023-01-13 DIAGNOSIS — S46011A Strain of muscle(s) and tendon(s) of the rotator cuff of right shoulder, initial encounter: Secondary | ICD-10-CM | POA: Diagnosis not present

## 2023-01-13 DIAGNOSIS — M25511 Pain in right shoulder: Secondary | ICD-10-CM

## 2023-01-13 DIAGNOSIS — M19011 Primary osteoarthritis, right shoulder: Secondary | ICD-10-CM | POA: Diagnosis not present

## 2023-01-13 DIAGNOSIS — M545 Low back pain, unspecified: Secondary | ICD-10-CM | POA: Diagnosis not present

## 2023-01-13 DIAGNOSIS — G8929 Other chronic pain: Secondary | ICD-10-CM

## 2023-01-13 DIAGNOSIS — M5416 Radiculopathy, lumbar region: Secondary | ICD-10-CM

## 2023-01-13 DIAGNOSIS — M25512 Pain in left shoulder: Secondary | ICD-10-CM | POA: Diagnosis not present

## 2023-01-13 DIAGNOSIS — M47816 Spondylosis without myelopathy or radiculopathy, lumbar region: Secondary | ICD-10-CM

## 2023-01-13 DIAGNOSIS — M4316 Spondylolisthesis, lumbar region: Secondary | ICD-10-CM

## 2023-01-14 NOTE — Progress Notes (Unsigned)
Telephone Visit- Progress Note: Referring Physician:  No referring provider defined for this encounter.  Primary Physician:  Jonetta Osgood, NP  This visit was performed via telephone.  Patient location: home Provider location: office  I spent a total of 15 minutes non-face-to-face activities for this visit on the date of this encounter including review of current clinical condition and response to treatment.    Patient has given verbal consent to this telephone visits and we reviewed the limitations of a telephone visit. Patient wishes to proceed.    Chief Complaint:  review lumbar MRI results  History of Present Illness: TESHIA MAHONE is a 72 y.o. female has a history of osteoporosis, high cholesterol, GAD.    Last seen by me on 12/30/22 for primary complaint of lower back pain, right > left. No change in symptoms since her last visit. She has known neuropathy in her legs. LBP is worse with prolonged sitting. She is worse when she wakes up and better as the day progresses.    MRI from 2016 showed retrolisthesis L3-L4, slip at L4-L5 and L5-S1 along with lumbar spondylosis and DDD.  Exam: No exam done as this was a telephone encounter.     Imaging: Lumbar MRI dated 01/13/23:  FINDINGS: Segmentation: Transitional lumbosacral vertebra numbered S1 based on prior study.   Alignment:  L4-5 mild degenerative anterolisthesis.   Vertebrae: No fracture, evidence of discitis, or bone lesion. Mild anterior corner edema at T12-L1. Resolved discogenic edema at L3-4.   Conus medullaris and cauda equina: Conus extends to the L2-3 level. Conus and cauda equina appear normal.   Paraspinal and other soft tissues: Negative for perispinal mass or inflammation.   Disc levels:   T12- L1: Anterior disc narrowing and endplate spurring. No neural impingement   L1-L2: Mild disc height loss and bulging.   L2-L3: Mild disc height loss and bulging   L3-L4: Disc narrowing and  endplate degeneration with circumferential disc bulging and ridging. Mild facet spurring.   L4-L5: Disc narrowing and bulging with left paracentral to foraminal protrusion. Negative facets.   L5-S1:Degenerative facet spurring with small joint effusions and mild anterolisthesis. Disc height loss and mild bulging. No neural compression   IMPRESSION: 1. Generalized lumbar spine degeneration with mild L5-S1 anterolisthesis. No neural compression. 2. Mild osteophyte edema at T12-L1. 3. Transitional S1 vertebra.     Electronically Signed   By: Jorje Guild M.D.   On: 01/14/2023 11:14  I have personally reviewed the images and agree with the above interpretation.  Assessment and Plan: Ms. Ruane is a pleasant 72 y.o. female has chronic constant diffuse back pain from her neck into her lower back x years.    Her primary complaint is lower back pain, right > left. She has intermittent pain in her legs with known neuropathy.   She has known slip at L4-L5 and L5-S1 along with lumbar spondylosis and DDD. LBP likely multifactorial and due to underlying DDD/facet hypertrophy.    Treatment options discussed with patient and following plan made:    - She does not want to revisit lumbar injections. No help with them in the past.  - We discussed PT. 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  - Will mail her exercise program for her lumbar spine. She will stop any exercise that makes her pain worse.  - If no improvement with above, may need to revisit formal PT for a few sessions for good HEP.  -  She will f/u with me with phone visit in 6 weeks.   Geronimo Boot PA-C Neurosurgery

## 2023-01-15 ENCOUNTER — Ambulatory Visit (INDEPENDENT_AMBULATORY_CARE_PROVIDER_SITE_OTHER): Payer: 59 | Admitting: Orthopedic Surgery

## 2023-01-15 ENCOUNTER — Encounter: Payer: Self-pay | Admitting: Orthopedic Surgery

## 2023-01-15 DIAGNOSIS — M4316 Spondylolisthesis, lumbar region: Secondary | ICD-10-CM | POA: Diagnosis not present

## 2023-01-15 DIAGNOSIS — M47816 Spondylosis without myelopathy or radiculopathy, lumbar region: Secondary | ICD-10-CM

## 2023-01-15 DIAGNOSIS — M5136 Other intervertebral disc degeneration, lumbar region: Secondary | ICD-10-CM

## 2023-01-20 DIAGNOSIS — G8929 Other chronic pain: Secondary | ICD-10-CM | POA: Diagnosis not present

## 2023-01-20 DIAGNOSIS — M19011 Primary osteoarthritis, right shoulder: Secondary | ICD-10-CM | POA: Diagnosis not present

## 2023-01-20 DIAGNOSIS — M25512 Pain in left shoulder: Secondary | ICD-10-CM | POA: Diagnosis not present

## 2023-01-20 DIAGNOSIS — M7581 Other shoulder lesions, right shoulder: Secondary | ICD-10-CM | POA: Diagnosis not present

## 2023-01-20 DIAGNOSIS — M7582 Other shoulder lesions, left shoulder: Secondary | ICD-10-CM | POA: Diagnosis not present

## 2023-01-20 DIAGNOSIS — M25511 Pain in right shoulder: Secondary | ICD-10-CM | POA: Diagnosis not present

## 2023-01-20 DIAGNOSIS — M19012 Primary osteoarthritis, left shoulder: Secondary | ICD-10-CM | POA: Diagnosis not present

## 2023-01-27 DIAGNOSIS — G4734 Idiopathic sleep related nonobstructive alveolar hypoventilation: Secondary | ICD-10-CM | POA: Diagnosis not present

## 2023-01-30 ENCOUNTER — Other Ambulatory Visit: Payer: Self-pay | Admitting: Surgery

## 2023-01-31 ENCOUNTER — Encounter
Admission: RE | Admit: 2023-01-31 | Discharge: 2023-01-31 | Disposition: A | Discharge status: Home / Self Care | Payer: 59 | Source: Ambulatory Visit | Attending: Surgery | Admitting: Surgery

## 2023-01-31 ENCOUNTER — Other Ambulatory Visit: Payer: Self-pay

## 2023-01-31 VITALS — BP 142/92 | HR 75 | Resp 16 | Ht 60.0 in | Wt 172.2 lb

## 2023-01-31 DIAGNOSIS — Z01818 Encounter for other preprocedural examination: Secondary | ICD-10-CM | POA: Diagnosis not present

## 2023-01-31 DIAGNOSIS — Z01812 Encounter for preprocedural laboratory examination: Secondary | ICD-10-CM

## 2023-01-31 HISTORY — DX: Depression, unspecified: F32.A

## 2023-01-31 HISTORY — DX: Personal history of urinary calculi: Z87.442

## 2023-01-31 HISTORY — DX: Nausea with vomiting, unspecified: R11.2

## 2023-01-31 HISTORY — DX: Unspecified osteoarthritis, unspecified site: M19.90

## 2023-01-31 HISTORY — DX: Other specified postprocedural states: Z98.890

## 2023-01-31 HISTORY — DX: Anxiety disorder, unspecified: F41.9

## 2023-01-31 LAB — CBC WITH DIFFERENTIAL/PLATELET
Abs Immature Granulocytes: 0.01 10*3/uL (ref 0.00–0.07)
Basophils Absolute: 0 10*3/uL (ref 0.0–0.1)
Basophils Relative: 1 %
Eosinophils Absolute: 0.1 10*3/uL (ref 0.0–0.5)
Eosinophils Relative: 2 %
HCT: 40.8 % (ref 36.0–46.0)
Hemoglobin: 13.7 g/dL (ref 12.0–15.0)
Immature Granulocytes: 0 %
Lymphocytes Relative: 32 %
Lymphs Abs: 1.7 10*3/uL (ref 0.7–4.0)
MCH: 29.7 pg (ref 26.0–34.0)
MCHC: 33.6 g/dL (ref 30.0–36.0)
MCV: 88.3 fL (ref 80.0–100.0)
Monocytes Absolute: 0.6 10*3/uL (ref 0.1–1.0)
Monocytes Relative: 11 %
Neutro Abs: 2.9 10*3/uL (ref 1.7–7.7)
Neutrophils Relative %: 54 %
Platelets: 230 10*3/uL (ref 150–400)
RBC: 4.62 MIL/uL (ref 3.87–5.11)
RDW: 13.3 % (ref 11.5–15.5)
WBC: 5.3 10*3/uL (ref 4.0–10.5)
nRBC: 0 % (ref 0.0–0.2)

## 2023-01-31 LAB — URINALYSIS, ROUTINE W REFLEX MICROSCOPIC
Bilirubin Urine: NEGATIVE
Glucose, UA: NEGATIVE mg/dL
Hgb urine dipstick: NEGATIVE
Ketones, ur: NEGATIVE mg/dL
Leukocytes,Ua: NEGATIVE
Nitrite: NEGATIVE
Protein, ur: NEGATIVE mg/dL
Specific Gravity, Urine: 1.002 — ABNORMAL LOW (ref 1.005–1.030)
pH: 8 (ref 5.0–8.0)

## 2023-01-31 LAB — COMPREHENSIVE METABOLIC PANEL
ALT: 20 U/L (ref 0–44)
AST: 22 U/L (ref 15–41)
Albumin: 4 g/dL (ref 3.5–5.0)
Alkaline Phosphatase: 63 U/L (ref 38–126)
Anion gap: 7 (ref 5–15)
BUN: 15 mg/dL (ref 8–23)
CO2: 27 mmol/L (ref 22–32)
Calcium: 9.8 mg/dL (ref 8.9–10.3)
Chloride: 105 mmol/L (ref 98–111)
Creatinine, Ser: 0.52 mg/dL (ref 0.44–1.00)
GFR, Estimated: >60 mL/min (ref >60)
Glucose, Bld: 90 mg/dL (ref 70–99)
Potassium: 3.8 mmol/L (ref 3.5–5.1)
Sodium: 139 mmol/L (ref 135–145)
Total Bilirubin: 0.7 mg/dL (ref 0.3–1.2)
Total Protein: 6.9 g/dL (ref 6.5–8.1)

## 2023-01-31 LAB — TYPE AND SCREEN
ABO/RH(D): A NEG
Antibody Screen: NEGATIVE

## 2023-01-31 LAB — SURGICAL PCR SCREEN
MRSA, PCR: NEGATIVE
Staphylococcus aureus: POSITIVE — AB

## 2023-01-31 NOTE — Patient Instructions (Addendum)
Your procedure is scheduled on: 02/11/23 - Tuesday Report to the Registration Desk on the 1st floor of the [facility]. To find out your arrival time, please call [phone] between 1PM - 3PM on: 02/10/23 - Monday If your arrival time is 6:00 am, do not arrive before that time as the [facility] entrance doors do not open until 6:00 am.  REMEMBER: Instructions that are not followed completely may result in serious medical risk, up to and including death; or upon the discretion of your surgeon and anesthesiologist your surgery may need to be rescheduled.  Do not eat food after midnight the night before surgery.  No gum chewing or hard candies.  You may however, drink CLEAR liquids up to 2 hours before you are scheduled to arrive for your surgery. Do not drink anything within 2 hours of your scheduled arrival time.  Clear liquids include: - water  - apple juice without pulp - gatorade (not RED colors) - black coffee or tea (Do NOT add milk or creamers to the coffee or tea) Do NOT drink anything that is not on this list..  In addition, your doctor has ordered for you to drink the provided:  Ensure Pre-Surgery Clear Carbohydrate Drink  Drinking this carbohydrate drink up to two hours before surgery helps to reduce insulin resistance and improve patient outcomes. Please complete drinking 2 hours before scheduled arrival time.  One week prior to surgery beginning 02/04/23,  Stop Anti-inflammatories (NSAIDS) such as Advil, Aleve, Ibuprofen, Motrin, Naproxen, Naprosyn and Aspirin based products such as Excedrin, Goody's Powder, BC Powder.  Stop taking beginning 02/04/23, ANY OVER THE COUNTER supplements until after surgery.  You may take Tylenol if needed for pain up until the day of surgery.  TAKE ONLY THESE MEDICATIONS THE MORNING OF SURGERY WITH A SIP OF WATER: NONE  No Alcohol for 24 hours before or after surgery.  No Smoking including e-cigarettes for 24 hours before surgery.   No chewable tobacco products for at least 6 hours before surgery.  No nicotine patches on the day of surgery.  Do not use any "recreational" drugs for at least a week (preferably 2 weeks) before your surgery.  Please be advised that the combination of cocaine and anesthesia may have negative outcomes, up to and including death. If you test positive for cocaine, your surgery will be cancelled.  On the morning of surgery brush your teeth with toothpaste and water, you may rinse your mouth with mouthwash if you wish. Do not swallow any toothpaste or mouthwash.  Use CHG Soap or wipes as directed on instruction sheet.  Do not wear jewelry, make-up, hairpins, clips or nail polish.  Do not wear lotions, powders, or perfumes.   Do not shave body hair from the neck down 48 hours before surgery.  Contact lenses, hearing aids and dentures may not be worn into surgery.  Do not bring valuables to the hospital. [facility] is not responsible for any missing/lost belongings or valuables.   Total Shoulder Arthroplasty:  use Benzoyl Peroxide 5% Gel as directed on instruction sheet.  Notify your doctor if there is any change in your medical condition (cold, fever, infection).  Wear comfortable clothing (specific to your surgery type) to the hospital.  After surgery, you can help prevent lung complications by doing breathing exercises.  Take deep breaths and cough every 1-2 hours. Your doctor may order a device called an Incentive Spirometer to help you take deep breaths. When coughing or sneezing, hold a  pillow firmly against your incision with both hands. This is called "splinting." Doing this helps protect your incision. It also decreases belly discomfort.  If you are being admitted to the hospital overnight, leave your suitcase in the car. After surgery it may be brought to your room.  In case of increased patient census, it may be necessary for you, the patient, to continue your postoperative  care in the Same Day Surgery department.  If you are being discharged the day of surgery, you will not be allowed to drive home. You will need a responsible individual to drive you home and stay with you for 24 hours after surgery.   If you are taking public transportation, you will need to have a responsible individual with you.  Please call the [facility] at [phone] if you have any questions about these instructions.  Surgery Visitation Policy:  Patients undergoing a surgery or procedure may have two family members or support persons with them as long as the person is not COVID-19 positive or experiencing its symptoms.   Inpatient Visitation:    Visiting hours are 7 a.m. to 8 p.m. Up to four visitors are allowed at one time in a patient room. The visitors may rotate out with other people during the day. One designated support person (adult) may remain overnight.  Due to an increase in RSV and influenza rates and associated hospitalizations, children ages 69 and under will not be able to visit patients in [facility].  Masks continue to be strongly recommended.    Preparing for Surgery with CHLORHEXIDINE GLUCONATE (CHG) Soap  Chlorhexidine Gluconate (CHG) Soap  o An antiseptic cleaner that kills germs and bonds with the skin to continue killing germs even after washing  o Used for showering the night before surgery and morning of surgery  Before surgery, you can play an important role by reducing the number of germs on your skin.  CHG (Chlorhexidine gluconate) soap is an antiseptic cleanser which kills germs and bonds with the skin to continue killing germs even after washing.  Please do not use if you have an allergy to CHG or antibacterial soaps. If your skin becomes reddened/irritated stop using the CHG.  1. Shower the NIGHT BEFORE SURGERY and the MORNING OF SURGERY with CHG soap.  2. If you choose to wash your hair, wash your hair first  as usual with your normal shampoo.  3. After shampooing, rinse your hair and body thoroughly to remove the shampoo.  4. Use CHG as you would any other liquid soap. You can apply CHG directly to the skin and wash gently with a scrungie or a clean washcloth.  5. Apply the CHG soap to your body only from the neck down. Do not use on open wounds or open sores. Avoid contact with your eyes, ears, mouth, and genitals (private parts). Wash face and genitals (private parts) with your normal soap.  6. Wash thoroughly, paying special attention to the area where your surgery will be performed.  7. Thoroughly rinse your body with warm water.  8. Do not shower/wash with your normal soap after using and rinsing off the CHG soap.  9. Pat yourself dry with a clean towel.  10. Wear clean pajamas to bed the night before surgery.  12. Place clean sheets on your bed the night of your first shower and do not sleep with pets.  13. Shower again with the CHG soap on the day of surgery prior to arriving  at the hospital.  14. Do not apply any deodorants/lotions/powders.  15. Please wear clean clothes to the hospital.  How to Use an Incentive Spirometer  An incentive spirometer is a tool that measures how well you are filling your lungs with each breath. Learning to take long, deep breaths using this tool can help you keep your lungs clear and active. This may help to reverse or lessen your chance of developing breathing (pulmonary) problems, especially infection. You may be asked to use a spirometer: After a surgery. If you have a lung problem or a history of smoking. After a long period of time when you have been unable to move or be active. If the spirometer includes an indicator to show the highest number that you have reached, your health care provider or respiratory therapist will help you set a goal. Keep a log of your progress as told by your health care provider. What are the risks? Breathing too  quickly may cause dizziness or cause you to pass out. Take your time so you do not get dizzy or light-headed. If you are in pain, you may need to take pain medicine before doing incentive spirometry. It is harder to take a deep breath if you are having pain. How to use your incentive spirometer  Sit up on the edge of your bed or on a chair. Hold the incentive spirometer so that it is in an upright position. Before you use the spirometer, breathe out normally. Place the mouthpiece in your mouth. Make sure your lips are closed tightly around it. Breathe in slowly and as deeply as you can through your mouth, causing the piston or the ball to rise toward the top of the chamber. Hold your breath for 3-5 seconds, or for as long as possible. If the spirometer includes a coach indicator, use this to guide you in breathing. Slow down your breathing if the indicator goes above the marked areas. Remove the mouthpiece from your mouth and breathe out normally. The piston or ball will return to the bottom of the chamber. Rest for a few seconds, then repeat the steps 10 or more times. Take your time and take a few normal breaths between deep breaths so that you do not get dizzy or light-headed. Do this every 1-2 hours when you are awake. If the spirometer includes a goal marker to show the highest number you have reached (best effort), use this as a goal to work toward during each repetition. After each set of 10 deep breaths, cough a few times. This will help to make sure that your lungs are clear. If you have an incision on your chest or abdomen from surgery, place a pillow or a rolled-up towel firmly against the incision when you cough. This can help to reduce pain while taking deep breaths and coughing. General tips When you are able to get out of bed: Walk around often. Continue to take deep breaths and cough in order to clear your lungs. Keep using the incentive spirometer until your health care  provider says it is okay to stop using it. If you have been in the hospital, you may be told to keep using the spirometer at home. Contact a health care provider if: You are having difficulty using the spirometer. You have trouble using the spirometer as often as instructed. Your pain medicine is not giving enough relief for you to use the spirometer as told. You have a fever. Get help right away if: You develop  shortness of breath. You develop a cough with bloody mucus from the lungs. You have fluid or blood coming from an incision site after you cough. Summary An incentive spirometer is a tool that can help you learn to take long, deep breaths to keep your lungs clear and active. You may be asked to use a spirometer after a surgery, if you have a lung problem or a history of smoking, or if you have been inactive for a long period of time. Use your incentive spirometer as instructed every 1-2 hours while you are awake. If you have an incision on your chest or abdomen, place a pillow or a rolled-up towel firmly against your incision when you cough. This will help to reduce pain. Get help right away if you have shortness of breath, you cough up bloody mucus, or blood comes from your incision when you cough. This information is not intended to replace advice given to you by your health care provider. Make sure you discuss any questions you have with your health care provider. Document Revised: 02/21/2020 Document Reviewed: 02/21/2020 Elsevier Patient Education  [place].

## 2023-02-12 ENCOUNTER — Ambulatory Visit: Payer: Medicare Other | Admitting: Nurse Practitioner

## 2023-02-16 NOTE — Procedures (Signed)
[place], [place] [id number]  DATE OF SERVICE: September 23, 2022  CAROTID DOPPLER INTERPRETATION:  Bilateral Carotid Ultrsasound and Color Doppler Examination was performed. The RIGHT CCA shows mild plaque in the vessel. The LEFT CCA shows mild plaque in the vessel. There was no significant intimal thickening noted in the RIGHT carotid artery. There was no significant intimal thickening in the LEFT carotid artery.  The RIGHT CCA shows peak systolic velocity of 123XX123 cm per second. The end diastolic velocity is 30 cm per second on the RIGHT side. The RIGHT ICA shows peak systolic velocity of A999333 per second. RIGHT sided ICA end diastolic velocity is 44 cm per second. The RIGHT ECA shows a peak systolic velocity of XX123456 cm per second. The ICA/CCA ratio is calculated to be 0.84. This suggests less than 50% stenosis. The Vertebral Artery shows antegrade flow.  The LEFT CCA shows peak systolic velocity of A999333 cm per second. The end diastolic velocity is 33 cm per second on the LEFT side. The LEFT ICA shows peak systolic velocity of 99991111 per second. LEFT sided ICA end diastolic velocity is 42 cm per second. The LEFT ECA shows a peak systolic velocity of 85 cm per second. The ICA/CCA ratio is calculated to be 0.9. This suggests less than 50% stenosis. The Vertebral Artery shows antegrade flow.   Impression:    The RIGHT CAROTID shows less than 50% stenosis. The LEFT CAROTID shows less than 50% stenosis.  There is mild plaque formation noted on the LEFT and mild on the RIGHT  side. Consider a repeat Carotid doppler if clinical situation and symptoms warrant in 6-12 months. Patient should be encouraged to change lifestyles such as smoking cessation, regular exercise and dietary modification. Use of statins in the right clinical setting and ASA is encouraged.  Allyne Gee, MD [facility] Pulmonary Critical Care Medicine

## 2023-02-21 ENCOUNTER — Other Ambulatory Visit: Payer: Self-pay | Admitting: Surgery

## 2023-02-21 NOTE — Progress Notes (Unsigned)
   Telephone Visit- Progress Note: Referring Physician:  Jonetta Osgood, NP [address],  [place] [id number]  Primary Physician:  Jonetta Osgood, NP  This visit was performed via telephone.  Patient location: home Provider location: office  I spent a total of 10 minutes non-face-to-face activities for this visit on the date of this encounter including review of current clinical condition and response to treatment.    Patient has given verbal consent to this telephone visits and we reviewed the limitations of a telephone visit. Patient wishes to proceed.    Chief Complaint:  recheck  History of Present Illness: Danielle Nash is a 71 y.o. female has a history of osteoporosis, high cholesterol, GAD.      Last had phone visit with me on 01/15/23  for recheck of her LBP and bilateral leg pain.   She has known slip at L4-L5 and L5-S1 along with lumbar spondylosis and DDD. LBP likely multifactorial and due to underlying DDD/facet hypertrophy.   I mailed her lumbar HEP at her last visit. 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 Previous injections did not help and she was very sick afterwards for weeks.   Phone visit set up to discuss progress with HEP.   She continues with more constant LBP that is worse in the morning. Better as she gets up and moves around. No current leg pain. She has been doing some of the HEP I sent her and thinks they are helping.   Primary complaint is right > left shoulder pain that is severe. Scheduled for right reverse TSA on 03/11/23 and then will get left done.   Still taking care of her husband.    Conservative measures:  Physical therapy: did not help back in 2016 Multimodal medical therapy including regular antiinflammatories: mobic, flexeril, zanaflex, ultram  Injections:  04/07/2015: Bilateral L4-5 transforaminal ESI (no relief) 03/17/2015: Right L4-5 transforaminal ESI (relief x1 week)     Past Surgery: no spinal surgery  Exam: No exam done as this was a telephone encounter.     Imaging: none  Assessment and Plan: Danielle Nash is a pleasant 72 y.o. female has more constant LBP that is worse in the morning. Better as she gets up and moves around. No current leg pain. She has been doing some of the HEP I sent her and thinks they are helping.   Primary complaint is right > left shoulder pain that is severe. Scheduled for right reverse TSA on 03/11/23 and then will get left done.    She has known slip at L4-L5 and L5-S1 along with lumbar spondylosis and DDD. LBP likely multifactorial and due to underlying DDD/facet hypertrophy.    Treatment options discussed with patient and following plan made:   - Continue HEP for her back.  - Previous injections did not help and made her sick.  - May need to revisit PT at some pint.  - She will focus on shoulders for now and call when she is ready to revisit her back.   Geronimo Boot PA-C Neurosurgery

## 2023-02-25 ENCOUNTER — Encounter: Payer: Self-pay | Admitting: Orthopedic Surgery

## 2023-02-25 ENCOUNTER — Ambulatory Visit (INDEPENDENT_AMBULATORY_CARE_PROVIDER_SITE_OTHER): Payer: Medicare Other | Admitting: Orthopedic Surgery

## 2023-02-25 DIAGNOSIS — M47816 Spondylosis without myelopathy or radiculopathy, lumbar region: Secondary | ICD-10-CM | POA: Diagnosis not present

## 2023-02-25 DIAGNOSIS — M5136 Other intervertebral disc degeneration, lumbar region: Secondary | ICD-10-CM | POA: Diagnosis not present

## 2023-02-25 DIAGNOSIS — M4316 Spondylolisthesis, lumbar region: Secondary | ICD-10-CM

## 2023-02-25 DIAGNOSIS — G4734 Idiopathic sleep related nonobstructive alveolar hypoventilation: Secondary | ICD-10-CM | POA: Diagnosis not present

## 2023-02-27 ENCOUNTER — Other Ambulatory Visit: Payer: 59

## 2023-03-03 DIAGNOSIS — M19011 Primary osteoarthritis, right shoulder: Secondary | ICD-10-CM | POA: Diagnosis not present

## 2023-03-04 ENCOUNTER — Encounter
Admission: RE | Admit: 2023-03-04 | Discharge: 2023-03-04 | Disposition: A | Discharge status: Home / Self Care | Payer: Medicare Other | Source: Ambulatory Visit | Attending: Surgery | Admitting: Surgery

## 2023-03-04 DIAGNOSIS — Z01812 Encounter for preprocedural laboratory examination: Secondary | ICD-10-CM

## 2023-03-04 NOTE — Pre-Procedure Instructions (Signed)
Review of previous surgery instructions. Unable to come in to obtain updated type and screen and mrsa pcr retest. Informed that she will need this done prior to surgery on the day of surgery then. All questions answered, acknowledges understanding.

## 2023-03-04 NOTE — Patient Instructions (Signed)
Your procedure is scheduled on: 03/11/2023  Report to the Registration Desk on the 1st floor of the [facility]. To find out your arrival time, please call [phone] between 1PM - 3PM on: 03/10/2023  If your arrival time is 6:00 am, do not arrive before that time as the [facility] entrance doors do not open until 6:00 am.  REMEMBER: Instructions that are not followed completely may result in serious medical risk, up to and including death; or upon the discretion of your surgeon and anesthesiologist your surgery may need to be rescheduled.  Do not eat food after midnight the night before surgery.  No gum chewing or hard candies.  You may however, drink CLEAR liquids up to 2 hours before you are scheduled to arrive for your surgery. Do not drink anything within 2 hours of your scheduled arrival time.  Clear liquids include: - water  - apple juice without pulp - gatorade (not RED colors) - black coffee or tea (Do NOT add milk or creamers to the coffee or tea) Do NOT drink anything that is not on this list.    In addition, your doctor has ordered for you to drink the provided:  Ensure Pre-Surgery Clear Carbohydrate Drink  Gatorade G2 Drinking this carbohydrate drink up to two hours before surgery helps to reduce insulin resistance and improve patient outcomes. Please complete drinking 2 hours before scheduled arrival time.  One week prior to surgery: Stop Anti-inflammatories (NSAIDS) such as Advil, Aleve, Ibuprofen, Motrin, Naproxen, Naprosyn and Aspirin based products such as Excedrin, Goody's Powder, BC Powder. Stop ANY OVER THE COUNTER supplements until after surgery. You may however, continue to take Tylenol if needed for pain up until the day of surgery.  Continue taking all prescribed medications with the exception of the following:  **Follow guidelines for insulin and diabetes medications**  Follow recommendations from Cardiologist or PCP regarding stopping blood  thinners.  TAKE ONLY THESE MEDICATIONS THE MORNING OF SURGERY WITH A SIP OF WATER:    Antacid (take one the night before and one on the morning of surgery - helps to prevent nausea after surgery.)  Use inhalers on the day of surgery and bring to the hospital.  Fleets enema or bowel prep as directed.  No Alcohol for 24 hours before or after surgery.  No Smoking including e-cigarettes for 24 hours before surgery.  No chewable tobacco products for at least 6 hours before surgery.  No nicotine patches on the day of surgery.  Do not use any "recreational" drugs for at least a week (preferably 2 weeks) before your surgery.  Please be advised that the combination of cocaine and anesthesia may have negative outcomes, up to and including death. If you test positive for cocaine, your surgery will be cancelled.  On the morning of surgery brush your teeth with toothpaste and water, you may rinse your mouth with mouthwash if you wish. Do not swallow any toothpaste or mouthwash.  Use CHG Soap or wipes as directed on instruction sheet.  Do not wear jewelry, make-up, hairpins, clips or nail polish.  Do not wear lotions, powders, or perfumes.   Do not shave body hair from the neck down 48 hours before surgery.  Contact lenses, hearing aids and dentures may not be worn into surgery.  Do not bring valuables to the hospital. [facility] is not responsible for any missing/lost belongings or valuables.   Total Shoulder Arthroplasty:  use Benzoyl Peroxide 5% Gel as directed on instruction sheet.  Bring your C-PAP to the hospital in case you may have to spend the night.   Notify your doctor if there is any change in your medical condition (cold, fever, infection).  Wear comfortable clothing (specific to your surgery type) to the hospital.  After surgery, you can help prevent lung complications by doing breathing exercises.  Take deep breaths and cough every 1-2 hours. Your doctor may order a  device called an Incentive Spirometer to help you take deep breaths. When coughing or sneezing, hold a pillow firmly against your incision with both hands. This is called "splinting." Doing this helps protect your incision. It also decreases belly discomfort.  If you are being admitted to the hospital overnight, leave your suitcase in the car. After surgery it may be brought to your room.  In case of increased patient census, it may be necessary for you, the patient, to continue your postoperative care in the Same Day Surgery department.  If you are being discharged the day of surgery, you will not be allowed to drive home. You will need a responsible individual to drive you home and stay with you for 24 hours after surgery.   If you are taking public transportation, you will need to have a responsible individual with you.  Please call the [facility] Dept. at [phone] if you have any questions about these instructions.  Surgery Visitation Policy:  Patients undergoing a surgery or procedure may have two family members or support persons with them as long as the person is not COVID-19 positive or experiencing its symptoms.   Inpatient Visitation:    Visiting hours are 7 a.m. to 8 p.m. Up to four visitors are allowed at one time in a patient room. The visitors may rotate out with other people during the day. One designated support person (adult) may remain overnight.  Due to an increase in RSV and influenza rates and associated hospitalizations, children ages 70 and under will not be able to visit patients in [facility]. Masks continue to be strongly recommended.  Preparing for Total Shoulder Arthroplasty  Before surgery, you can play an important role by reducing the number of germs on your skin by using the following products:  Benzoyl Peroxide Gel  o Reduces the number of germs present on the skin  o Applied twice a day to shoulder area starting two days  before surgery  Chlorhexidine Gluconate (CHG) Soap  o An antiseptic cleaner that kills germs and bonds with the skin to continue killing germs even after washing  o Used for showering the night before surgery and morning of surgery  BENZOYL PEROXIDE 5% GEL  Please do not use if you have an allergy to benzoyl peroxide. If your skin becomes reddened/irritated stop using the benzoyl peroxide.  Starting two days before surgery, apply as follows:  1. Apply benzoyl peroxide in the morning and at night. Apply after taking a shower. If you are not taking a shower, clean entire shoulder front, back, and side along with the armpit with a clean wet washcloth.  2. Place a quarter-sized dollop on your shoulder and rub in thoroughly, making sure to cover the front, back, and side of your shoulder, along with the armpit.  2 days before ____ AM ____ PM 1 day before ____ AM ____ PM  3. Do this twice a day for two days. (Last application is the night before surgery, AFTER using the CHG soap).  4. Do NOT apply benzoyl peroxide gel on  the day of surgery.  How to Use an Incentive Spirometer  An incentive spirometer is a tool that measures how well you are filling your lungs with each breath. Learning to take long, deep breaths using this tool can help you keep your lungs clear and active. This may help to reverse or lessen your chance of developing breathing (pulmonary) problems, especially infection. You may be asked to use a spirometer: After a surgery. If you have a lung problem or a history of smoking. After a long period of time when you have been unable to move or be active. If the spirometer includes an indicator to show the highest number that you have reached, your health care provider or respiratory therapist will help you set a goal. Keep a log of your progress as told by your health care provider. What are the risks? Breathing too quickly may cause dizziness or cause you to pass out. Take your  time so you do not get dizzy or light-headed. If you are in pain, you may need to take pain medicine before doing incentive spirometry. It is harder to take a deep breath if you are having pain. How to use your incentive spirometer  Sit up on the edge of your bed or on a chair. Hold the incentive spirometer so that it is in an upright position. Before you use the spirometer, breathe out normally. Place the mouthpiece in your mouth. Make sure your lips are closed tightly around it. Breathe in slowly and as deeply as you can through your mouth, causing the piston or the ball to rise toward the top of the chamber. Hold your breath for 3-5 seconds, or for as long as possible. If the spirometer includes a coach indicator, use this to guide you in breathing. Slow down your breathing if the indicator goes above the marked areas. Remove the mouthpiece from your mouth and breathe out normally. The piston or ball will return to the bottom of the chamber. Rest for a few seconds, then repeat the steps 10 or more times. Take your time and take a few normal breaths between deep breaths so that you do not get dizzy or light-headed. Do this every 1-2 hours when you are awake. If the spirometer includes a goal marker to show the highest number you have reached (best effort), use this as a goal to work toward during each repetition. After each set of 10 deep breaths, cough a few times. This will help to make sure that your lungs are clear. If you have an incision on your chest or abdomen from surgery, place a pillow or a rolled-up towel firmly against the incision when you cough. This can help to reduce pain while taking deep breaths and coughing. General tips When you are able to get out of bed: Walk around often. Continue to take deep breaths and cough in order to clear your lungs. Keep using the incentive spirometer until your health care provider says it is okay to stop using it. If you have been in the  hospital, you may be told to keep using the spirometer at home. Contact a health care provider if: You are having difficulty using the spirometer. You have trouble using the spirometer as often as instructed. Your pain medicine is not giving enough relief for you to use the spirometer as told. You have a fever. Get help right away if: You develop shortness of breath. You develop a cough with bloody mucus from the lungs. You have  fluid or blood coming from an incision site after you cough. Summary An incentive spirometer is a tool that can help you learn to take long, deep breaths to keep your lungs clear and active. You may be asked to use a spirometer after a surgery, if you have a lung problem or a history of smoking, or if you have been inactive for a long period of time. Use your incentive spirometer as instructed every 1-2 hours while you are awake. If you have an incision on your chest or abdomen, place a pillow or a rolled-up towel firmly against your incision when you cough. This will help to reduce pain. Get help right away if you have shortness of breath, you cough up bloody mucus, or blood comes from your incision when you cough. This information is not intended to replace advice given to you by your health care provider. Make sure you discuss any questions you have with your health care provider. Document Revised: 02/21/2020 Document Reviewed: 02/21/2020 Elsevier Patient Education  [place].  Preoperative Educational Videos for Total Hip, Knee and Shoulder Replacements  To better prepare for surgery, please view our videos that explain the physical activity and discharge planning required to have the best surgical recovery at [facility].  [email or web address]  Questions? Call [phone] or email [email or web address]

## 2023-03-10 MED ORDER — CEFAZOLIN SODIUM-DEXTROSE 2-4 GM/100ML-% IV SOLN
2.0000 g | INTRAVENOUS | Status: AC
  Administered 2023-03-11: 2 g via INTRAVENOUS

## 2023-03-10 MED ORDER — CHLORHEXIDINE GLUCONATE 0.12 % MT SOLN: 15.0000 mL | Freq: Once | OROMUCOSAL | Status: AC

## 2023-03-10 MED ORDER — ORAL CARE MOUTH RINSE: 15.0000 mL | Freq: Once | OROMUCOSAL | Status: AC

## 2023-03-10 MED ORDER — LACTATED RINGERS IV SOLN: INTRAVENOUS | Status: DC

## 2023-03-10 MED ORDER — FAMOTIDINE 20 MG PO TABS: 20.0000 mg | ORAL_TABLET | Freq: Once | ORAL | Status: AC

## 2023-03-11 ENCOUNTER — Other Ambulatory Visit: Payer: Self-pay

## 2023-03-11 ENCOUNTER — Encounter
Admission: RE | Disposition: A | Discharge status: Home / Self Care | Payer: Self-pay | Source: Home / Self Care | Attending: Surgery

## 2023-03-11 ENCOUNTER — Encounter: Payer: Self-pay | Admitting: Surgery

## 2023-03-11 ENCOUNTER — Ambulatory Visit: Payer: Medicare Other | Admitting: Urgent Care

## 2023-03-11 ENCOUNTER — Ambulatory Visit: Payer: Medicare Other

## 2023-03-11 ENCOUNTER — Ambulatory Visit
Admission: RE | Admit: 2023-03-11 | Discharge: 2023-03-11 | Disposition: A | Discharge status: Home / Self Care | Payer: Medicare Other | Attending: Surgery | Admitting: Surgery

## 2023-03-11 ENCOUNTER — Ambulatory Visit: Payer: Medicare Other | Admitting: Anesthesiology

## 2023-03-11 DIAGNOSIS — G8918 Other acute postprocedural pain: Secondary | ICD-10-CM | POA: Diagnosis not present

## 2023-03-11 DIAGNOSIS — F32A Depression, unspecified: Secondary | ICD-10-CM | POA: Diagnosis not present

## 2023-03-11 DIAGNOSIS — M1990 Unspecified osteoarthritis, unspecified site: Secondary | ICD-10-CM | POA: Diagnosis not present

## 2023-03-11 DIAGNOSIS — E669 Obesity, unspecified: Secondary | ICD-10-CM | POA: Diagnosis not present

## 2023-03-11 DIAGNOSIS — G40909 Epilepsy, unspecified, not intractable, without status epilepticus: Secondary | ICD-10-CM | POA: Diagnosis not present

## 2023-03-11 DIAGNOSIS — M19011 Primary osteoarthritis, right shoulder: Secondary | ICD-10-CM | POA: Insufficient documentation

## 2023-03-11 DIAGNOSIS — Z6832 Body mass index (BMI) 32.0-32.9, adult: Secondary | ICD-10-CM | POA: Insufficient documentation

## 2023-03-11 DIAGNOSIS — G709 Myoneural disorder, unspecified: Secondary | ICD-10-CM | POA: Insufficient documentation

## 2023-03-11 DIAGNOSIS — M7521 Bicipital tendinitis, right shoulder: Secondary | ICD-10-CM | POA: Insufficient documentation

## 2023-03-11 DIAGNOSIS — Z471 Aftercare following joint replacement surgery: Secondary | ICD-10-CM | POA: Diagnosis not present

## 2023-03-11 DIAGNOSIS — L720 Epidermal cyst: Secondary | ICD-10-CM | POA: Insufficient documentation

## 2023-03-11 DIAGNOSIS — D2361 Other benign neoplasm of skin of right upper limb, including shoulder: Secondary | ICD-10-CM | POA: Diagnosis not present

## 2023-03-11 DIAGNOSIS — M19012 Primary osteoarthritis, left shoulder: Secondary | ICD-10-CM | POA: Diagnosis not present

## 2023-03-11 DIAGNOSIS — Z96611 Presence of right artificial shoulder joint: Secondary | ICD-10-CM | POA: Diagnosis not present

## 2023-03-11 DIAGNOSIS — Z01812 Encounter for preprocedural laboratory examination: Secondary | ICD-10-CM

## 2023-03-11 HISTORY — PX: MASS EXCISION: SHX2000

## 2023-03-11 HISTORY — PX: REVERSE SHOULDER ARTHROPLASTY: SHX5054

## 2023-03-11 LAB — TYPE AND SCREEN
ABO/RH(D): A NEG
Antibody Screen: NEGATIVE

## 2023-03-11 LAB — SURGICAL PCR SCREEN
MRSA, PCR: NEGATIVE
Staphylococcus aureus: POSITIVE — AB

## 2023-03-11 SURGERY — ARTHROPLASTY, SHOULDER, TOTAL, REVERSE
Anesthesia: Regional | Site: Shoulder | Laterality: Right

## 2023-03-11 MED ORDER — LACTATED RINGERS IV SOLN: INTRAVENOUS | Status: DC

## 2023-03-11 MED ORDER — PROPOFOL 10 MG/ML IV BOLUS
INTRAVENOUS | Status: DC | PRN
  Administered 2023-03-11: 150 mg via INTRAVENOUS
  Administered 2023-03-11: 70 mg via INTRAVENOUS

## 2023-03-11 MED ORDER — OXYCODONE HCL 5 MG PO TABS: 5.0000 mg | ORAL_TABLET | Freq: Once | ORAL | Status: DC | PRN

## 2023-03-11 MED ORDER — PHENYLEPHRINE HCL-NACL 20-0.9 MG/250ML-% IV SOLN
INTRAVENOUS | Status: AC
  Filled 2023-03-11: qty 250

## 2023-03-11 MED ORDER — METOCLOPRAMIDE HCL 10 MG PO TABS: 5.0000 mg | ORAL_TABLET | Freq: Three times a day (TID) | ORAL | Status: DC | PRN

## 2023-03-11 MED ORDER — SODIUM CHLORIDE 0.9 % IR SOLN
Status: DC | PRN
  Administered 2023-03-11: 3000 mL

## 2023-03-11 MED ORDER — DEXAMETHASONE SODIUM PHOSPHATE 10 MG/ML IJ SOLN
INTRAMUSCULAR | Status: DC | PRN
  Administered 2023-03-11: 10 mg via INTRAVENOUS

## 2023-03-11 MED ORDER — METOCLOPRAMIDE HCL 5 MG/ML IJ SOLN: 5.0000 mg | Freq: Three times a day (TID) | INTRAMUSCULAR | Status: DC | PRN

## 2023-03-11 MED ORDER — BUPIVACAINE HCL (PF) 0.5 % IJ SOLN
INTRAMUSCULAR | Status: AC
  Filled 2023-03-11: qty 10

## 2023-03-11 MED ORDER — CEFAZOLIN SODIUM-DEXTROSE 2-4 GM/100ML-% IV SOLN: 2.0000 g | Freq: Four times a day (QID) | INTRAVENOUS | Status: DC

## 2023-03-11 MED ORDER — FENTANYL CITRATE (PF) 100 MCG/2ML IJ SOLN: 25.0000 ug | INTRAMUSCULAR | Status: DC | PRN

## 2023-03-11 MED ORDER — CHLORHEXIDINE GLUCONATE 0.12 % MT SOLN
OROMUCOSAL | Status: AC
  Administered 2023-03-11: 15 mL via OROMUCOSAL
  Filled 2023-03-11: qty 15

## 2023-03-11 MED ORDER — ONDANSETRON HCL 4 MG PO TABS: 4.0000 mg | ORAL_TABLET | Freq: Four times a day (QID) | ORAL | Status: DC | PRN

## 2023-03-11 MED ORDER — TRANEXAMIC ACID 1000 MG/10ML IV SOLN
INTRAVENOUS | Status: DC | PRN
  Administered 2023-03-11: 1000 mg via TOPICAL

## 2023-03-11 MED ORDER — OXYCODONE HCL 5 MG PO TABS: 5.0000 mg | ORAL_TABLET | ORAL | 0 refills | Status: DC | PRN

## 2023-03-11 MED ORDER — MIDAZOLAM HCL 2 MG/2ML IJ SOLN
INTRAMUSCULAR | Status: AC
  Administered 2023-03-11: 1 mg via INTRAVENOUS
  Filled 2023-03-11: qty 2

## 2023-03-11 MED ORDER — SODIUM CHLORIDE FLUSH 0.9 % IV SOLN
INTRAVENOUS | Status: AC
  Filled 2023-03-11: qty 20

## 2023-03-11 MED ORDER — FENTANYL CITRATE PF 50 MCG/ML IJ SOSY: 50.0000 ug | PREFILLED_SYRINGE | Freq: Once | INTRAMUSCULAR | Status: AC

## 2023-03-11 MED ORDER — ONDANSETRON HCL 4 MG/2ML IJ SOLN
INTRAMUSCULAR | Status: DC | PRN
  Administered 2023-03-11: 4 mg via INTRAVENOUS

## 2023-03-11 MED ORDER — BUPIVACAINE HCL (PF) 0.5 % IJ SOLN
INTRAMUSCULAR | Status: DC | PRN
  Administered 2023-03-11: 10 mL

## 2023-03-11 MED ORDER — ACETAMINOPHEN 10 MG/ML IV SOLN: 1000.0000 mg | Freq: Once | INTRAVENOUS | Status: DC | PRN

## 2023-03-11 MED ORDER — 0.9 % SODIUM CHLORIDE (POUR BTL) OPTIME
TOPICAL | Status: DC | PRN
  Administered 2023-03-11: 500 mL

## 2023-03-11 MED ORDER — BUPIVACAINE LIPOSOME 1.3 % IJ SUSP
INTRAMUSCULAR | Status: AC
  Filled 2023-03-11: qty 10

## 2023-03-11 MED ORDER — LACTATED RINGERS IV SOLN: INTRAVENOUS | Status: DC | PRN

## 2023-03-11 MED ORDER — SCOPOLAMINE 1 MG/3DAYS TD PT72
1.0000 | MEDICATED_PATCH | TRANSDERMAL | Status: DC
  Administered 2023-03-11: 1.5 mg via TRANSDERMAL

## 2023-03-11 MED ORDER — PHENYLEPHRINE HCL-NACL 20-0.9 MG/250ML-% IV SOLN
INTRAVENOUS | Status: DC | PRN
  Administered 2023-03-11: 20 ug/min via INTRAVENOUS

## 2023-03-11 MED ORDER — BUPIVACAINE LIPOSOME 1.3 % IJ SUSP
INTRAMUSCULAR | Status: AC
  Filled 2023-03-11: qty 20

## 2023-03-11 MED ORDER — FENTANYL CITRATE PF 50 MCG/ML IJ SOSY
PREFILLED_SYRINGE | INTRAMUSCULAR | Status: AC
  Administered 2023-03-11: 50 ug via INTRAVENOUS
  Filled 2023-03-11: qty 1

## 2023-03-11 MED ORDER — FAMOTIDINE 20 MG PO TABS
ORAL_TABLET | ORAL | Status: AC
  Administered 2023-03-11: 20 mg via ORAL
  Filled 2023-03-11: qty 1

## 2023-03-11 MED ORDER — SUGAMMADEX SODIUM 200 MG/2ML IV SOLN
INTRAVENOUS | Status: DC | PRN
  Administered 2023-03-11: 200 mg via INTRAVENOUS

## 2023-03-11 MED ORDER — TRANEXAMIC ACID 1000 MG/10ML IV SOLN
INTRAVENOUS | Status: AC
  Filled 2023-03-11: qty 10

## 2023-03-11 MED ORDER — OXYCODONE HCL 5 MG PO TABS: 5.0000 mg | ORAL_TABLET | ORAL | Status: DC | PRN

## 2023-03-11 MED ORDER — FENTANYL CITRATE (PF) 100 MCG/2ML IJ SOLN
INTRAMUSCULAR | Status: AC
  Filled 2023-03-11: qty 2

## 2023-03-11 MED ORDER — CEFAZOLIN SODIUM-DEXTROSE 2-4 GM/100ML-% IV SOLN
INTRAVENOUS | Status: AC
  Filled 2023-03-11: qty 100

## 2023-03-11 MED ORDER — OXYCODONE HCL 5 MG/5ML PO SOLN: 5.0000 mg | Freq: Once | ORAL | Status: DC | PRN

## 2023-03-11 MED ORDER — BUPIVACAINE-EPINEPHRINE (PF) 0.5% -1:200000 IJ SOLN
INTRAMUSCULAR | Status: DC | PRN
  Administered 2023-03-11: 30 mL via PERINEURAL

## 2023-03-11 MED ORDER — PHENYLEPHRINE HCL (PRESSORS) 10 MG/ML IV SOLN
INTRAVENOUS | Status: DC | PRN
  Administered 2023-03-11 (×2): 160 ug via INTRAVENOUS
  Administered 2023-03-11: 80 ug via INTRAVENOUS
  Administered 2023-03-11: 40 ug via INTRAVENOUS

## 2023-03-11 MED ORDER — PROPOFOL 10 MG/ML IV BOLUS
INTRAVENOUS | Status: AC
  Filled 2023-03-11: qty 60

## 2023-03-11 MED ORDER — SODIUM CHLORIDE 0.9 % IV SOLN: INTRAVENOUS | Status: DC

## 2023-03-11 MED ORDER — SCOPOLAMINE 1 MG/3DAYS TD PT72
MEDICATED_PATCH | TRANSDERMAL | Status: AC
  Filled 2023-03-11: qty 1

## 2023-03-11 MED ORDER — ONDANSETRON HCL 4 MG/2ML IJ SOLN: 4.0000 mg | Freq: Four times a day (QID) | INTRAMUSCULAR | Status: DC | PRN

## 2023-03-11 MED ORDER — KETOROLAC TROMETHAMINE 15 MG/ML IJ SOLN: 15.0000 mg | Freq: Once | INTRAMUSCULAR | Status: AC

## 2023-03-11 MED ORDER — ONDANSETRON HCL 4 MG/2ML IJ SOLN: 4.0000 mg | Freq: Once | INTRAMUSCULAR | Status: DC | PRN

## 2023-03-11 MED ORDER — TRIAMCINOLONE ACETONIDE 0.1 % EX CREA: 1.0000 | TOPICAL_CREAM | CUTANEOUS | Status: AC | PRN

## 2023-03-11 MED ORDER — BUPIVACAINE HCL (PF) 0.5 % IJ SOLN
INTRAMUSCULAR | Status: AC
  Filled 2023-03-11: qty 30

## 2023-03-11 MED ORDER — CEFAZOLIN SODIUM-DEXTROSE 2-4 GM/100ML-% IV SOLN
INTRAVENOUS | Status: AC
  Administered 2023-03-11: 2 g via INTRAVENOUS
  Filled 2023-03-11: qty 100

## 2023-03-11 MED ORDER — KETOROLAC TROMETHAMINE 15 MG/ML IJ SOLN
INTRAMUSCULAR | Status: AC
  Administered 2023-03-11: 15 mg via INTRAVENOUS
  Filled 2023-03-11: qty 1

## 2023-03-11 MED ORDER — EPHEDRINE SULFATE (PRESSORS) 50 MG/ML IJ SOLN
INTRAMUSCULAR | Status: DC | PRN
  Administered 2023-03-11 (×3): 5 mg via INTRAVENOUS

## 2023-03-11 MED ORDER — FENTANYL CITRATE (PF) 100 MCG/2ML IJ SOLN
INTRAMUSCULAR | Status: DC | PRN
  Administered 2023-03-11 (×2): 50 ug via INTRAVENOUS

## 2023-03-11 MED ORDER — EPINEPHRINE PF 1 MG/ML IJ SOLN
INTRAMUSCULAR | Status: AC
  Filled 2023-03-11: qty 1

## 2023-03-11 MED ORDER — MIDAZOLAM HCL 2 MG/2ML IJ SOLN
1.0000 mg | INTRAMUSCULAR | Status: AC | PRN
  Administered 2023-03-11: 1 mg via INTRAVENOUS

## 2023-03-11 MED ORDER — STERILE WATER FOR IRRIGATION IR SOLN
Status: DC | PRN
  Administered 2023-03-11: 1000 mL

## 2023-03-11 MED ORDER — ROCURONIUM BROMIDE 100 MG/10ML IV SOLN
INTRAVENOUS | Status: DC | PRN
  Administered 2023-03-11: 50 mg via INTRAVENOUS
  Administered 2023-03-11: 10 mg via INTRAVENOUS

## 2023-03-11 MED ORDER — BUPIVACAINE LIPOSOME 1.3 % IJ SUSP
INTRAMUSCULAR | Status: DC | PRN
  Administered 2023-03-11: 20 mL

## 2023-03-11 SURGICAL SUPPLY — 72 items
APL PRP STRL LF DISP 70% ISPRP (MISCELLANEOUS) ×1
BASEPLATE BOSS DRILL (MISCELLANEOUS) IMPLANT
BIT DRILL 2.5 (BIT) ×1
BIT DRILL 2.5X4.5XSCR (BIT) IMPLANT
BIT DRILL F/BASEPLATE CENTRAL (BIT) IMPLANT
BIT DRL 2.5X4.5XSCR (BIT) ×1
BLADE SAW SAG 25X90X1.19 (BLADE) ×1 IMPLANT
BSPLAT GLND THK2 22.8X27.3 (Plate) ×1 IMPLANT
CHLORAPREP W/TINT 26 (MISCELLANEOUS) ×1 IMPLANT
COOLER POLAR GLACIER W/PUMP (MISCELLANEOUS) ×1 IMPLANT
COVER BACK TABLE REUSABLE LG (DRAPES) ×1 IMPLANT
DRAPE 3/4 80X56 (DRAPES) ×1 IMPLANT
DRAPE INCISE IOBAN 66X45 STRL (DRAPES) ×1 IMPLANT
DRILL BASEPLATE CENTRAL  S (BIT) ×1
DRILL BASEPLATE CENTRAL S (BIT) ×1
DRSG OPSITE POSTOP 3X4 (GAUZE/BANDAGES/DRESSINGS) IMPLANT
DRSG OPSITE POSTOP 4X8 (GAUZE/BANDAGES/DRESSINGS) ×1 IMPLANT
ELECT BLADE 6.5 EXT (BLADE) IMPLANT
ELECT CAUTERY BLADE 6.4 (BLADE) ×1 IMPLANT
ELECT REM PT RETURN 9FT ADLT (ELECTROSURGICAL) ×1
ELECTRODE REM PT RTRN 9FT ADLT (ELECTROSURGICAL) ×1 IMPLANT
GAUZE XEROFORM 1X8 LF (GAUZE/BANDAGES/DRESSINGS) ×1 IMPLANT
GLENOSPHERE RSS 2 CONCENTRIC (Shoulder) IMPLANT
GLOVE BIO SURGEON STRL SZ7.5 (GLOVE) ×4 IMPLANT
GLOVE BIO SURGEON STRL SZ8 (GLOVE) ×4 IMPLANT
GLOVE BIOGEL PI IND STRL 8 (GLOVE) ×2 IMPLANT
GLOVE INDICATOR 8.0 STRL GRN (GLOVE) ×1 IMPLANT
GOWN STRL REUS W/ TWL LRG LVL3 (GOWN DISPOSABLE) ×1 IMPLANT
GOWN STRL REUS W/ TWL XL LVL3 (GOWN DISPOSABLE) ×1 IMPLANT
GOWN STRL REUS W/TWL LRG LVL3 (GOWN DISPOSABLE) ×1
GOWN STRL REUS W/TWL XL LVL3 (GOWN DISPOSABLE) ×1
GUIDE PIN 2.0 X 150 (WIRE) IMPLANT
HOOD PEEL AWAY T7 (MISCELLANEOUS) ×3 IMPLANT
IV NS IRRIG 3000ML ARTHROMATIC (IV SOLUTION) ×1 IMPLANT
KIT STABILIZATION SHOULDER (MISCELLANEOUS) ×1 IMPLANT
KIT TURNOVER KIT A (KITS) ×1 IMPLANT
LINER STD +3S RSS HXL (Liner) IMPLANT
MANIFOLD NEPTUNE II (INSTRUMENTS) ×1 IMPLANT
MASK FACE SPIDER DISP (MASK) ×1 IMPLANT
MAT ABSORB  FLUID 56X50 GRAY (MISCELLANEOUS) ×1
MAT ABSORB FLUID 56X50 GRAY (MISCELLANEOUS) ×1 IMPLANT
NDL MAYO CATGUT SZ1 (NEEDLE) IMPLANT
NDL SAFETY ECLIP 18X1.5 (MISCELLANEOUS) ×1 IMPLANT
NDL SPNL 20GX3.5 QUINCKE YW (NEEDLE) ×1 IMPLANT
NEEDLE MAYO CATGUT SZ1 (NEEDLE) IMPLANT
NEEDLE SPNL 20GX3.5 QUINCKE YW (NEEDLE) ×1 IMPLANT
NS IRRIG 500ML POUR BTL (IV SOLUTION) ×1 IMPLANT
PACK ARTHROSCOPY SHOULDER (MISCELLANEOUS) ×1 IMPLANT
PAD ARMBOARD 7.5X6 YLW CONV (MISCELLANEOUS) ×1 IMPLANT
PAD WRAPON POLAR SHDR UNIV (MISCELLANEOUS) ×1 IMPLANT
PLATE BASE REVERSE RSS S (Plate) IMPLANT
PULSAVAC PLUS IRRIG FAN TIP (DISPOSABLE) ×1
SCREW 4.5X15 RSS W CAP (Screw) IMPLANT
SCREW 4.5X30 RSS W CAP (Screw) IMPLANT
SCREW BODY REVERSE LRG (Screw) IMPLANT
SLING ULTRA II M (MISCELLANEOUS) IMPLANT
SPONGE T-LAP 18X18 ~~LOC~~+RFID (SPONGE) ×2 IMPLANT
STAPLER SKIN PROX 35W (STAPLE) ×1 IMPLANT
STEM PRESS FIT SZ 10 TSS (Stem) IMPLANT
SUT ETHIBOND 0 MO6 C/R (SUTURE) ×1 IMPLANT
SUT FIBERWIRE #2 38 BLUE 1/2 (SUTURE) ×4
SUT VIC AB 0 CT1 36 (SUTURE) ×1 IMPLANT
SUT VIC AB 2-0 CT1 27 (SUTURE) ×2
SUT VIC AB 2-0 CT1 TAPERPNT 27 (SUTURE) ×2 IMPLANT
SUTURE FIBERWR #2 38 BLUE 1/2 (SUTURE) ×4 IMPLANT
SYR 10ML LL (SYRINGE) ×1 IMPLANT
SYR 30ML LL (SYRINGE) ×1 IMPLANT
SYR TOOMEY 50ML (SYRINGE) ×1 IMPLANT
TIP FAN IRRIG PULSAVAC PLUS (DISPOSABLE) ×1 IMPLANT
TRAP FLUID SMOKE EVACUATOR (MISCELLANEOUS) ×1 IMPLANT
WATER STERILE IRR 500ML POUR (IV SOLUTION) ×1 IMPLANT
WRAPON POLAR PAD SHDR UNIV (MISCELLANEOUS) ×1

## 2023-03-11 NOTE — Op Note (Signed)
03/11/2023  10:15 AM  Patient:   Danielle Nash  Pre-Op Diagnosis:   1. Advanced degenerative joint disease with rotator cuff and biceps tendinopathy, right shoulder.  2. Benign neoplasm posterior superior right shoulder.  Post-Op Diagnosis:   Same.  Procedure:   1. Reverse right total shoulder arthroplasty with biceps tenodesis.  2. Excision of benign neoplasm posterior superior right shoulder.  Surgeon:   Pascal Lux, MD  Assistant:   Cameron Proud, PA-C  Anesthesia:   General endotracheal with an interscalene block using Exparel placed preoperatively by the anesthesiologist.  Findings:   As above.  The benign neoplasm removed was consistent with a 1 cm diameter sebaceous cyst.  Complications:   None  EBL:   50 cc  Fluids:   800 cc crystalloid  UOP:   None  TT:   None  Drains:   None  Closure:   Staples  Implants:   All press-fit Integra system with a 10 mm stem, a large metaphyseal body, a +3 mm humeral platform, a mini baseplate, and a 38 mm concentric +2 mm laterally offset glenosphere.  Brief Clinical Note:   The patient is a 72 year old female with a long history of progressively worsening right shoulder pain, weakness, and stiffness. The symptoms have progressed despite medications, activity modification, etc. Her history and examination are consistent with advanced degenerative joint disease as confirmed by plain radiographs. An MRI scan also demonstrated significant tendinopathic changes with biceps tendinopathy. The patient presents at this time for a reverse right total shoulder arthroplasty with biceps tenodesis.  The patient also notes a long history of an intermittently painful soft tissue mass on the posterior side inferior aspect of her right shoulder. The patient's history and examination are consistent with a sebaceous cyst. The patient would like to have this removed as well.  Procedure:   The patient underwent placement of an interscalene block by the  anesthesiologist in the preoperative holding area before being brought into the operating room and lain in the supine position. The patient then underwent general endotracheal intubation and anesthesia before the patient was repositioned in the beach chair position using the beach chair positioner. The right shoulder and upper extremity were prepped with ChloraPrep solution before being draped sterilely. Preoperative antibiotics were administered.   A timeout was performed to verify the appropriate surgical site before a standard anterior approach to the shoulder was made through an approximately 4-5 inch incision. The incision was carried down through the subcutaneous tissues to expose the deltopectoral fascia. The interval between the deltoid and pectoralis muscles was identified and this plane developed, retracting the cephalic vein laterally with the deltoid muscle. The conjoined tendon was identified. Its lateral margin was dissected and the Kolbel self-retraining retractor inserted. The "three sisters" were identified and cauterized. Bursal tissues were removed to improve visualization.   The biceps tendon was identified near the inferior aspect of the bicipital groove. A soft tissue tenodesis was performed by attaching the biceps tendon to the adjacent pectoralis major tendon using two #0 Ethibond interrupted sutures. The biceps tendon was then transected just proximal to the tenodesis site. The subscapularis tendon was released from its attachment to the lesser tuberosity 1 cm proximal to its insertion and several tagging sutures placed. The inferior capsule was released with care after identifying and protecting the axillary nerve. The proximal humeral cut was made at approximately 20 of retroversion using the extra-medullary guide.   Attention was redirected to the glenoid. The labrum was debrided  circumferentially before the center of the glenoid was identified. The guidewire was drilled into the  glenoid neck using the appropriate guide. After verifying its position, it was overreamed with the mini-baseplate reamer to create a flat surface before the stem reamer was utilized. The superior and inferior peg sites were reamed using the appropriate guide to complete the glenoid preparation. The permanent mini-baseplate was impacted into place. It was stabilized with a 15 x 4.5 mm central screw and four peripheral screws. Locking caps were placed over the superior and inferior screws. The permanent 38 mm concentric glenosphere with +2 mm of lateral offset was then impacted into place and its Morse taper locking mechanism verified using manual distraction.  Attention was directed to the humeral side. The humeral canal was prepared utilizing the tapered stem reamers sequentially beginning with the 7 mm stem and progressing to a 10 mm stem. This demonstrated a good tight fit. The metaphyseal region was then prepared using the appropriate planar device. The trial stem and standard metaphyseal body were put together on the back table and a trial reduction performed using the +0 mm and +6 mm inserts. Even with the 6 mm insert, it was still somewhat loose. Therefore, the large metaphyseal body was trialed with the +0 mm and +3 mm inserts. With the +3 mm insert, the arm demonstrated excellent range of motion as the hand could be brought across the chest to the opposite shoulder and brought to the top of the patient's head and to the patient's ear. The shoulder appeared stable throughout this range of motion. The joint was dislocated and the trial components removed. The permanent 10 mm stem with the large metaphyseal body was impacted into place with care taken to maintain the appropriate version. A repeat trial reduction with the +3 mm insert again demonstrated excellent stability with the findings as described above. Therefore, the shoulder was re-dislocated and, after inserting the locking screw to secure the body  to the stem, the permanent +3 mm insert impacted into place. After verifying its locking mechanism, the shoulder was relocated using two finger pressure and again placed through a range of motion with the findings as described above.  The wound was copiously irrigated with sterile saline solution using the jet lavage system before a total of 20 cc of Exparel diluted out to 60 cc with normal saline and 30 cc of 0.5% Sensorcaine with epinephrine was injected into the pericapsular and peri-incisional tissues to help with postoperative analgesia. The subscapularis tendon was reapproximated using #2 FiberWire interrupted sutures. The deltopectoral interval was closed using #0 Vicryl interrupted sutures before the subcutaneous tissues were closed using 2-0 Vicryl interrupted sutures. The skin was closed using staples. Prior to closing the skin, 1 g of transexemic acid in 10 cc of normal saline was injected intra-articularly to help with postoperative bleeding.   At this point, the benign neoplasm was addressed. An approximate 1.5 cm incision was made longitudinally over the subcutaneous mass which was easily palpable. The incision was carried down through the subcutaneous tissues to expose the mass. This was ellipsed out in its entirety and removed. During its removal, it ruptured, demonstrating caseating material consistent with a sebaceous cyst. The mass was sent to pathology for identification as a precaution.  This wound was irrigated thoroughly with sterile saline solution before being closed using 2-0 Vicryl interrupted sutures for the subcutaneous tissue and staples for the skin.    Sterile occlusive dressings were applied to each wound before the arm  was placed into a shoulder immobilizer with an abduction pillow. A Polar Care system also was applied to the shoulder. The patient was then transferred back to a hospital bed before being awakened, extubated, and returned to the recovery room in satisfactory  condition after tolerating the procedure well.

## 2023-03-11 NOTE — Transfer of Care (Signed)
Immediate Anesthesia Transfer of Care Note  Patient: Arna Snipe  Procedure(s) Performed: REVERSE SHOULDER ARTHROPLASTY (Right: Shoulder) EXCISION OF BENIGN NEOPLASM (Right: Shoulder)  Patient Location: PACU  Anesthesia Type:General  Level of Consciousness: drowsy  Airway & Oxygen Therapy: Patient Spontanous Breathing and Patient connected to face mask oxygen  Post-op Assessment: Report given to RN and Post -op Vital signs reviewed and stable  Post vital signs: Reviewed  Last Vitals:  Vitals Value Taken Time  BP 114/66 03/11/23 1015  Temp 8F   Pulse 82 03/11/23 1023  Resp 18 03/11/23 1023  SpO2 93 % 03/11/23 1023  Vitals shown include unvalidated device data.  Last Pain:  Vitals:   03/11/23 [id number]  TempSrc: Temporal  PainSc: 0-No pain         Complications: No notable events documented.

## 2023-03-11 NOTE — Discharge Instructions (Addendum)
Orthopedic discharge instructions: Keep dressing dry and intact.  May shower with intact OpSite dressing once nerve block has worn off (on post-op day #4 - Saturday).  Cover staples/sutures with Band-Aids after drying off. Apply ice frequently to shoulder or use Polar Care device. Take ibuprofen 600 mg TID with meals for 3-5 days, then as necessary. Take oxycodone as prescribed when needed.  May supplement with ES Tylenol if necessary. Keep shoulder immobilizer on at all times except may remove for bathing purposes. Follow-up in 10-14 days or as scheduled.   AMBULATORY SURGERY  DISCHARGE INSTRUCTIONS   The drugs that you were given will stay in your system until tomorrow so for the next 24 hours you should not:  Drive an automobile Make any legal decisions Drink any alcoholic beverage   You may resume regular meals tomorrow.  Today it is better to start with liquids and gradually work up to solid foods.  You may eat anything you prefer, but it is better to start with liquids, then soup and crackers, and gradually work up to solid foods.   Please notify your doctor immediately if you have any unusual bleeding, trouble breathing, redness and pain at the surgery site, drainage, fever, or pain not relieved by medication.    Additional Instructions: PLEASE LEAVE TEAL (EXPAREL) ARMBAND ON FOR 4 DAYS  YOU HAVE A SCOPE PATCH BEHIND YOUR LEFT EAR, PLEASE REMOVE IT NO LATER THAN TOMORROW MORNING    Please contact your physician with any problems or Same Day Surgery at [phone], Monday through Friday 6 am to 4 pm, or [place] at [facility] number at [phone].   POLAR CARE INFORMATION  [email or web address]  How to use [place] Cold Therapy System?  YouTube   [email or web address]  OPERATING INSTRUCTIONS  Start the product With dry hands, connect the transformer to the electrical connection located on the top of the cooler. Next, plug  the transformer into an appropriate electrical outlet. The unit will automatically start running at this point.  To stop the pump, disconnect electrical power.  Unplug to stop the product when not in use. Unplugging the Polar Care unit turns it off. Always unplug immediately after use. Never leave it plugged in while unattended. Remove pad.    FIRST ADD WATER TO FILL LINE, THEN ICE---Replace ice when existing ice is almost melted  1 Discuss Treatment with your [place] Practitioner and Use Only as Prescribed 2 Apply Insulation Barrier & Cold Therapy Pad 3 Check for Moisture 4 Inspect Skin Regularly  Tips and Trouble Shooting Usage Tips 1. Use cubed or chunked ice for optimal performance. 2. It is recommended to drain the Pad between uses. To drain the pad, hold the Pad upright with the hose pointed toward the ground. Depress the black plunger and allow water to drain out. 3. You may disconnect the Pad from the unit without removing the pad from the affected area by depressing the silver tabs on the hose coupling and gently pulling the hoses apart. The Pad and unit will seal itself and will not leak. Note: Some dripping during release is normal. 4. DO NOT RUN PUMP WITHOUT WATER! The pump in this unit is designed to run with water. Running the unit without water will cause permanent damage to the pump. 5. Unplug unit before removing lid.  TROUBLESHOOTING GUIDE Pump not running, Water not flowing to the pad, Pad is not getting cold 1. Make sure the transformer is plugged into the wall outlet.  2. Confirm that the ice and water are filled to the indicated levels. 3. Make sure there are no kinks in the pad. 4. Gently pull on the blue tube to make sure the tube/pad junction is straight. 5. Remove the pad from the treatment site and ll it while the pad is lying at; then reapply. 6. Confirm that the pad couplings are securely attached to the unit. Listen for the double clicks (Figure 1)  to confirm the pad couplings are securely attached.  Leaks    Note: Some condensation on the lines, controller, and pads is unavoidable, especially in warmer climates. 1. If using a [place] Polar Care Cold Therapy unit with a detachable Cold Therapy Pad, and a leak exists (other than condensation on the lines) disconnect the pad couplings. Make sure the silver tabs on the couplings are depressed before reconnecting the pad to the pump hose; then confirm both sides of the coupling are properly clicked in. 2. If the coupling continues to leak or a leak is detected in the pad itself, stop using it and call [place] at [phone].  Cleaning After use, empty and dry the unit with a soft cloth. Warm water and mild detergent may be used occasionally to clean the pump and tubes.  WARNING: The [place] can be cold enough to cause serious injury, including full skin necrosis. Follow these Operating Instructions, and carefully read the Product Insert (see pouch on side of unit) and the Cold Therapy Pad Fitting Instructions (provided with each Cold Therapy Pad) prior to use.   SHOULDER SLING IMMOBILIZER   VIDEO Slingshot 2 Shoulder Brace Application - YouTube ---[email or web address]  INSTRUCTIONS While supporting the injured arm, slide the forearm into the sling. Wrap the adjustable shoulder strap around the neck and shoulders and attach the strap end to the sling using  the "alligator strap tab."  Adjust the shoulder strap to the required length. Position the shoulder pad behind the neck. To secure the shoulder pad location (optional), pull the shoulder strap away from the shoulder pad, unfold the hook material on the top of the pad, then press the shoulder strap back onto the hook material to secure the pad in place. Attach the closure strap across the open top of the sling. Position the strap so that it holds the arm securely in the sling. Next, attach the thumb  strap to the open end of the sling between the thumb and fingers. After sling has been fit, it may be easily removed and reapplied using the quick release buckle on shoulder strap. If a neutral pillow or 15 abduction pillow is included, place the pillow at the waistline. Attach the sling to the pillow, lining up hook material on the pillow with the loop on sling. Adjust the waist strap to fit.  If waist strap is too long, cut it to fit. Use the small piece of double sided hook material (located on top of the pillow) to secure the strap end. Place the double sided hook material on the inside of the cut strap end and secure it to the waist strap.     If no pillow is included, attach the waist strap to the sling and adjust to fit.    Washing Instructions: Straps and sling must be removed and cleaned regularly depending on your activity level and perspiration. Hand wash straps and sling in cold water with mild detergent, rinse, air dry         Interscalene Nerve Block  with Exparel   For your surgery you have received an Interscalene Nerve Block with Exparel. Nerve Blocks affect many types of nerves, including nerves that control movement, pain and normal sensation.  You may experience feelings such as numbness, tingling, heaviness, weakness or the inability to move your arm or the feeling or sensation that your arm has "fallen asleep". A nerve block with Exparel can last up to 5 days.  Usually the weakness wears off first.  The tingling and heaviness usually wear off next.  Finally you may start to notice pain.  Keep in mind that this may occur in any order.  Once a nerve block starts to wear off it is usually completely gone within 60 minutes. ISNB may cause mild shortness of breath, a hoarse voice, blurry vision, unequal pupils, or drooping of the face on the same side as the nerve block.  These symptoms will usually resolve with the numbness.  Very rarely the procedure itself can cause mild  seizures. If needed, your surgeon will give you a prescription for pain medication.  It will take about 60 minutes for the oral pain medication to become fully effective.  So, it is recommended that you start taking this medication before the nerve block first begins to wear off, or when you first begin to feel discomfort. Take your pain medication only as prescribed.  Pain medication can cause sedation and decrease your breathing if you take more than you need for the level of pain that you have. Nausea is a common side effect of many pain medications.  You may want to eat something before taking your pain medicine to prevent nausea. After an Interscalene nerve block, you cannot feel pain, pressure or extremes in temperature in the effected arm.  Because your arm is numb it is at an increased risk for injury.  To decrease the possibility of injury, please practice the following:  While you are awake change the position of your arm frequently to prevent too much pressure on any one area for prolonged periods of time.  If you have a cast or tight dressing, check the color or your fingers every couple of hours.  Call your surgeon with the appearance of any discoloration (white or blue). If you are given a sling to wear before you go home, please wear it  at all times until the block has completely worn off.  Do not get up at night without your sling. Please contact [facility] or your surgeon if you do not begin to regain sensation after 7 days from the surgery.  Anesthesia may be contacted by calling the Same Day Surgery Department, Mon. through Fri., 6 am to 4 pm at [phone].   If you experience any other problems or concerns, please contact your surgeon's office. If you experience severe or prolonged shortness of breath go to the nearest emergency department.

## 2023-03-11 NOTE — Anesthesia Postprocedure Evaluation (Signed)
Anesthesia Post Note  Patient: SMITHA CLAYWELL  Procedure(s) Performed: REVERSE SHOULDER ARTHROPLASTY (Right: Shoulder) EXCISION OF BENIGN NEOPLASM (Right: Shoulder)  Patient location during evaluation: PACU Anesthesia Type: Regional Level of consciousness: awake and alert, oriented and patient cooperative Pain management: pain level controlled Vital Signs Assessment: post-procedure vital signs reviewed and stable Respiratory status: spontaneous breathing, nonlabored ventilation and respiratory function stable Cardiovascular status: blood pressure returned to baseline and stable Postop Assessment: adequate PO intake Anesthetic complications: no   No notable events documented.   Last Vitals:  Vitals:   03/11/23 1109 03/11/23 1114  BP: 114/65   Pulse: 71   Resp: 16   Temp:    SpO2:  93%    Last Pain:  Vitals:   03/11/23 1109  TempSrc:   PainSc: 0-No pain                 Darrin Nipper

## 2023-03-11 NOTE — Anesthesia Preprocedure Evaluation (Addendum)
Anesthesia Evaluation  Patient identified by MRN, date of birth, ID band Patient awake    Reviewed: Allergy & Precautions, NPO status , Patient's Chart, lab work & pertinent test results  History of Anesthesia Complications (+) PONV and history of anesthetic complications  Airway Mallampati: IV   Neck ROM: Full    Dental  (+) Missing   Pulmonary  Reactive airway disease   Pulmonary exam normal breath sounds clear to auscultation       Cardiovascular Exercise Tolerance: Good Normal cardiovascular exam Rhythm:Regular Rate:Normal  ECG 01/31/23:  Sinus bradycardia with sinus arrhythmia Otherwise normal ECG When compared with ECG of 05-Sep-2017 01:02, No significant change was found   Neuro/Psych  Headaches, Seizures: neuropathy.  PSYCHIATRIC DISORDERS  Depression     Neuromuscular disease    GI/Hepatic PUD,,,  Endo/Other  Obesity   Renal/GU Renal disease (nephrolithiasis)     Musculoskeletal  (+) Arthritis ,    Abdominal   Peds  Hematology negative hematology ROS (+)   Anesthesia Other Findings   Reproductive/Obstetrics                             Anesthesia Physical Anesthesia Plan  ASA: 2  Anesthesia Plan: General and Regional   Post-op Pain Management: Regional block*   Induction: Intravenous  PONV Risk Score and Plan: 4 or greater and Ondansetron, Dexamethasone, Treatment may vary due to age or medical condition and Scopolamine patch - Pre-op  Airway Management Planned: Oral ETT  Additional Equipment:   Intra-op Plan:   Post-operative Plan: Extubation in OR  Informed Consent: I have reviewed the patients History and Physical, chart, labs and discussed the procedure including the risks, benefits and alternatives for the proposed anesthesia with the patient or authorized representative who has indicated his/her understanding and acceptance.     Dental advisory  given  Plan Discussed with: CRNA  Anesthesia Plan Comments: (Plan for preoperative interscalene nerve block and GETA.  Patient consented for risks of anesthesia including but not limited to:  - adverse reactions to medications - damage to eyes, teeth, lips or other oral mucosa - nerve damage due to positioning  - sore throat or hoarseness - damage to heart, brain, nerves, lungs, other parts of body or loss of life  Informed patient about role of CRNA in peri- and intra-operative care.  Patient voiced understanding.)        Anesthesia Quick Evaluation

## 2023-03-11 NOTE — H&P (Signed)
History of Present Illness: Danielle Nash is a 72 y.o. who presents today for a history and physical. She is to undergo a right reverse total shoulder arthroplasty on 03/11/2023. Since her last visit here to clinic there is been no improvement in her condition. Patient expresses her desire to proceed with surgery.  The patient has been seen in the past on several occasions, she has undergone multiple bilateral shoulder steroid injections without significant relief. She has undergone viscosupplementation injections in the past without any significant relief as well. She has also undergone Zilretta injections without any significant relief. In the past the patient had been experiencing increased pain in the left shoulder versus right however over the last several weeks to months she reports more significant pain in her right shoulder compared to the left. The patient did recently undergo bilateral shoulder MRI scans which demonstrated severe bilateral shoulder osteoarthritic changes with significant rotator cuff tendinopathy. The patient is quite frustrated by her continued discomfort and would like to proceed with more aggressive treatment options at this time. She denies any recent trauma or injury affecting either shoulder. She denies any new onset numbness or tingling, she does have a history of a carpal tunnel release of the right upper extremity in the past. She takes over-the-counter medication as needed for discomfort at this time. She reports a 9 out of 10 pain score in the right shoulder at today's visit. She denies any personal history of heart attack, stroke, asthma or COPD. She denies any personal history of blood clots. She is not a diabetic  Past Medical History: Allergic rhinitis  Arthritis  Chronic insomnia 10/02/2015  DDD (degenerative disc disease), lumbar 2016  At L4-5 there is minimal disc desiccation and minimal disc bulging. There are mild facet degenerative changes. There is no  central or foraminal stenosis. At L3-4 there is severe degenerative disc disease with reactive end-plate changes and broad-based disc bulging. There are mild to moderate facet degenerative changes with a grade 1 retrolisthesis. There is no central or foraminal stenosis.  Hemorrhoids  Hypercalcemia  s/p parathyroidectomy  Lumbar radiculitis 05/19/2015  Migraines  Parathyroid adenoma 2005 (In Rt lobe. s/p excision)  Sleep apnea (On CPAP)   Past Surgical History: KNEE ARTHROSCOPY Right 1998  Makoplasty Bilateral 10/06/2013  APPENDECTOMY 10/19/2016  HERNIA REPAIR 09/05/2017 (abdominal)  ENDOSCOPIC CARPAL TUNNEL RELEASE Right 09/10/2021 (Dr. Rudene Christians)  [place] (Per pt report 1/2 of her thyroid was removed during surgery)   Past Family History: Stroke Mother  Osteoporosis (Thinning of bones) Mother  Diabetes Mother  Myocardial Infarction (Heart attack) Father  High blood pressure (Hypertension) Father  Breast cancer Sister   Medications: acetaminophen (TYLENOL) 500 MG tablet Take 1,000 mg by mouth every 6 (six) hours as needed for Pain  b complex vitamins tablet Take 1 tablet by mouth once daily.  biotin 5 mg Tab Take 1 tablet by mouth once daily  cholecalciferol (VITAMIN D3) 2,000 unit tablet Take 2,000 Units by mouth 2 (two) times daily.  cyanocobalamin (VITAMIN B12) 1000 MCG tablet Take 1,000 mcg by mouth once daily.  dicyclomine (BENTYL) 10 mg capsule Take by mouth Take 1 capsule (10 mg total) by mouth 4 (four) times daily - before meals and at bedtime.  krill-om3-dha-epa-om6-lip-astx (KRILL OIL, OMEGA 3 AND 6,) 1000-130(40-80) mg Cap Take 1 capsule by mouth once daily  Lacto.acidophilus-Bif.animalis 32 billion cell Cap Take by mouth Take 1 tablet by mouth 1 day or 1 dose.  lidocaine (LIDODERM) 5 %  patch Place 1 patch onto the skin once as needed  loratadine (CLARITIN) 10 mg tablet Take 10 mg by mouth once daily.  MAGNESIUM CHLORIDE ORAL  Take 1 tablet by mouth 2 (two) times daily  potassium gluconate 595 mg (99 mg) Tab Take 1 tablet by mouth 3 (three) times daily.  ZINC ORAL Take 400 mg by mouth once daily.   Allergies: Levofloxacin Other (Caused severe joint pain)  Erythromycin Other (Burned holes in esophagus, Severe pain)  Oxycodone Other (Itching and depression) Percocet [Oxycodone-Acetaminophen] Other (Multiple doses cause depression)  Sulfa (Sulfonamide Antibiotics) Other (Caused skin to burn)  Sulfasalazine Other (Unknown)   Review of Systems: A comprehensive 14 point ROS was performed, reviewed, and the pertinent orthopaedic findings are documented in the HPI.  Exam BP 130/80 (BP Location: Left upper arm, Patient Position: Sitting, BP Cuff Size: Adult)  Ht 152.4 cm (5')  Wt 76.7 kg (169 lb 3.2 oz)  LMP 08/16/2001 (LMP Unknown)  BMI 33.04 kg/m   General: Well-developed well-nourished female seen in no acute distress.   HEENT: Atraumatic,normocephalic. Pupils are equal and reactive to light. Oropharynx is clear with moist mucosa  Lungs: Clear to auscultation bilaterally   Cardiovascular: Regular rate and rhythm. Normal S1, S2. No murmurs. No appreciable gallops or rubs. Peripheral pulses are palpable.  Abdomen: Soft, non-tender, nondistended. Bowel sounds present  Extremity: Left arm shows forward flexion to be approximate 90 degrees actively with 180 degrees passively. Abduction was 25 degrees actively and passively 120 degrees. Right arm shows full range of motion except with internal rotation which is limited to 30 degrees with increased pain.   Neurological: The patient is alert and oriented Sensation to light touch appears to be intact and within normal limits Gross motor strength appeared to be equal to 5/5  Vascular: Peripheral pulses felt to be palpable. Capillary refill appears to be intact and within normal limits  X-ray: 1. X-rays taken in [facility] of the right shoulder shows  bone-on-bone to the glenohumeral articulation. There is flattening of the humeral head. There is a large osteophyte formation noted off the inferior humeral head. Subchondral sclerosis is noted.   Impression: 1. Degenerative arthrosis right shoulder  Plan:  1. Treatment options were discussed today with the patient. 2. MRI scan of bilateral shoulders demonstrate significant osteoarthritic changes to bilateral upper extremities with moderate to severe rotator cuff tendinopathy. 3. The patient is quite frustrated by her continued right greater than left shoulder discomfort at this time. After a discussion of the risk and benefits of potential surgical intervention, the patient would like to proceed with a right reverse total shoulder arthroplasty at this time. 4. The patient was instructed on the risk and benefits of a right reverse total shoulder arthroplasty, the patient will be scheduled Dr. Roland Rack in the future. 5. This document will serve as a surgical history and physical for the patient. 6. She will follow-up per standard postop protocol. She can contact the clinic if she has any questions, new symptoms develop or symptoms worsen.  The procedure was discussed with the patient, as were the potential risks (including bleeding, infection, nerve and/or blood vessel injury, persistent or recurrent pain, failure of the hardware, dislocation, axillary nerve injury, stress fracture, need for further surgery, blood clots, strokes, heart attacks and/or arhythmias, pneumonia, etc.) and benefits. The patient states her understanding and wishes to proceed.    H&P reviewed and patient re-examined. No changes.

## 2023-03-11 NOTE — Evaluation (Signed)
Occupational Therapy Evaluation Patient Details Name: Danielle Nash MRN: [id number] DOB: 1951/03/15 Today's Date: 03/11/2023   History of Present Illness Pt is 72 y/o female s/p reverse shoulder arthroplasty.   Clinical Impression   Upon entering the room, pt seated in recliner chair with son present in the room. Pt endorses being Ind at baseline and living with husband who is disabled and in wheelchair. OT reviewed R UE NWB status, H/W/E exercise, donning/doffing of sling, polar care use, and self care education with pt and son. Pt has not arranged from support person at home to assist with sling and self care tasks. Pt's son believes some others can intermittently provide support. Pt needing assistance to fasten LB clothing fasteners. OT educated son on how to don sling and reposition with him returning demonstration. Paper handout provided for home to refer to in regards of education/information. They had no further questions at this time. OT to complete order and sign off.      Recommendations for follow up therapy are one component of a multi-disciplinary discharge planning process, led by the attending physician.  Recommendations may be updated based on patient status, additional functional criteria and insurance authorization.   Assistance Recommended at Discharge Intermittent Supervision/Assistance  Patient can return home with the following A little help with walking and/or transfers;A lot of help with bathing/dressing/bathroom;Assistance with cooking/housework;Assist for transportation;Help with stairs or ramp for entrance       Equipment Recommendations  None recommended by OT       Precautions / Restrictions Precautions Precautions: Fall;Shoulder Shoulder Interventions: Timmothy Sours joy ultra sling;Off for dressing/bathing/exercises;At all times;Shoulder abduction pillow Restrictions Weight Bearing Restrictions: Yes RUE Weight Bearing: Non weight bearing      Mobility Bed  Mobility               General bed mobility comments: seated in recliner chair    Transfers Overall transfer level: Modified independent                        Balance Overall balance assessment: Modified Independent                                         ADL either performed or assessed with clinical judgement   ADL Overall ADL's : Needs assistance/impaired                 Upper Body Dressing : Moderate assistance;Sitting   Lower Body Dressing: Minimal assistance;Sit to/from stand   Toilet Transfer: Supervision/safety                   Vision Patient Visual Report: No change from baseline              Pertinent Vitals/Pain Pain Assessment Pain Assessment: No/denies pain     Hand Dominance Right   Extremity/Trunk Assessment Upper Extremity Assessment Upper Extremity Assessment: RUE deficits/detail RUE Deficits / Details: surgical incision - NWB (not tested)           Communication Communication Communication: No difficulties   Cognition Arousal/Alertness: Awake/alert Behavior During Therapy: WFL for tasks assessed/performed Overall Cognitive Status: Within Functional Limits for tasks assessed  Home Living Family/patient expects to be discharged to:: Private residence Living Arrangements: Other relatives;Non-relatives/Friends Available Help at Discharge: Family;Friend(s);Available PRN/intermittently Type of Home: Mobile home Home Access: Ramped entrance     Home Layout: One level     Bathroom Shower/Tub: Walk-in shower         Home Equipment: Shower seat          Prior Functioning/Environment Prior Level of Function : Independent/Modified Independent                                 OT Goals(Current goals can be found in the care plan section) Acute Rehab OT Goals Patient Stated Goal: to go home OT Goal  Formulation: With patient Time For Goal Achievement: 03/11/23 Potential to Achieve Goals: Good  OT Frequency:         AM-PAC OT "6 Clicks" Daily Activity     Outcome Measure Help from another person eating meals?: None Help from another person taking care of personal grooming?: None Help from another person toileting, which includes using toliet, bedpan, or urinal?: A Little Help from another person bathing (including washing, rinsing, drying)?: A Little Help from another person to put on and taking off regular upper body clothing?: A Lot Help from another person to put on and taking off regular lower body clothing?: A Little 6 Click Score: 19   End of Session Nurse Communication: Mobility status  Activity Tolerance: Patient tolerated treatment well Patient left: in chair;with family/visitor present                   Time: 1345-1416 OT Time Calculation (min): 31 min Charges:  OT General Charges $OT Visit: 1 Visit OT Evaluation $OT Eval Low Complexity: 1 Low OT Treatments $Self Care/Home Management : 8-22 mins  Darleen Crocker, MS, OTR/L , CBIS ascom [phone]  03/11/23, 2:25 PM

## 2023-03-11 NOTE — Anesthesia Procedure Notes (Signed)
Anesthesia Regional Block: Interscalene brachial plexus block   Pre-Anesthetic Checklist: , timeout performed,  Correct Patient, Correct Site, Correct Laterality,  Correct Procedure,, risks and benefits discussed,  Surgical consent,  Pre-op evaluation,  At surgeon's request and post-op pain management  Laterality: Right  Prep: chloraprep       Needles:  Injection technique: Single-shot  Needle Type: Echogenic Needle          Additional Needles:   Procedures:,,,, ultrasound used (permanent image in chart),,   Motor weakness within 20 minutes.  Narrative:  Start time: 03/11/2023 7:38 AM End time: 03/11/2023 7:40 AM Injection made incrementally with aspirations every 5 mL.  Performed by: Personally  Anesthesiologist: Darrin Nipper, MD  Additional Notes: Functioning IV was confirmed and monitors applied.  Sterile prep and drape, hand hygiene and sterile gloves were used. Ultrasound guidance: relevant anatomy identified, needle position confirmed, local anesthetic spread visualized around nerve(s), vascular puncture avoided.  Image saved to electronic medical record.  Negative aspiration prior to incremental administration of local anesthetic for total 20 ml Exparel and 10 ml bupivacaine 0.5% given in interscalene distribution. The patient tolerated the procedure well. Vital signs and moderate sedation medications recorded in RN notes.

## 2023-03-11 NOTE — Anesthesia Procedure Notes (Signed)
Procedure Name: Intubation Date/Time: 03/11/2023 7:56 AM  Performed by: Otho Perl, CRNAPre-anesthesia Checklist: Patient identified, Patient being monitored, Timeout performed, Emergency Drugs available and Suction available Patient Re-evaluated:Patient Re-evaluated prior to induction Oxygen Delivery Method: Circle system utilized Preoxygenation: Pre-oxygenation with 100% oxygen Induction Type: IV induction Ventilation: Mask ventilation without difficulty Laryngoscope Size: Mac and 3 Grade View: Grade I Tube type: Oral Tube size: 7.0 mm Number of attempts: 1 Airway Equipment and Method: Stylet Placement Confirmation: ETT inserted through vocal cords under direct vision, positive ETCO2 and breath sounds checked- equal and bilateral Secured at: 19 cm Tube secured with: Tape Dental Injury: Teeth and Oropharynx as per pre-operative assessment

## 2023-03-12 ENCOUNTER — Encounter: Payer: Self-pay | Admitting: Surgery

## 2023-03-13 LAB — SURGICAL PATHOLOGY

## 2023-03-15 DIAGNOSIS — J309 Allergic rhinitis, unspecified: Secondary | ICD-10-CM | POA: Diagnosis not present

## 2023-03-15 DIAGNOSIS — Z471 Aftercare following joint replacement surgery: Secondary | ICD-10-CM | POA: Diagnosis not present

## 2023-03-15 DIAGNOSIS — Z483 Aftercare following surgery for neoplasm: Secondary | ICD-10-CM | POA: Diagnosis not present

## 2023-03-15 DIAGNOSIS — C7641 Malignant neoplasm of right upper limb: Secondary | ICD-10-CM | POA: Diagnosis not present

## 2023-03-15 DIAGNOSIS — G4700 Insomnia, unspecified: Secondary | ICD-10-CM | POA: Diagnosis not present

## 2023-03-15 DIAGNOSIS — G43909 Migraine, unspecified, not intractable, without status migrainosus: Secondary | ICD-10-CM | POA: Diagnosis not present

## 2023-03-15 DIAGNOSIS — M5116 Intervertebral disc disorders with radiculopathy, lumbar region: Secondary | ICD-10-CM | POA: Diagnosis not present

## 2023-03-15 DIAGNOSIS — Z96611 Presence of right artificial shoulder joint: Secondary | ICD-10-CM | POA: Diagnosis not present

## 2023-03-15 DIAGNOSIS — G4730 Sleep apnea, unspecified: Secondary | ICD-10-CM | POA: Diagnosis not present

## 2023-03-18 DIAGNOSIS — G4730 Sleep apnea, unspecified: Secondary | ICD-10-CM | POA: Diagnosis not present

## 2023-03-18 DIAGNOSIS — C7641 Malignant neoplasm of right upper limb: Secondary | ICD-10-CM | POA: Diagnosis not present

## 2023-03-18 DIAGNOSIS — M5116 Intervertebral disc disorders with radiculopathy, lumbar region: Secondary | ICD-10-CM | POA: Diagnosis not present

## 2023-03-18 DIAGNOSIS — G4700 Insomnia, unspecified: Secondary | ICD-10-CM | POA: Diagnosis not present

## 2023-03-18 DIAGNOSIS — Z96611 Presence of right artificial shoulder joint: Secondary | ICD-10-CM | POA: Diagnosis not present

## 2023-03-18 DIAGNOSIS — Z483 Aftercare following surgery for neoplasm: Secondary | ICD-10-CM | POA: Diagnosis not present

## 2023-03-18 DIAGNOSIS — Z471 Aftercare following joint replacement surgery: Secondary | ICD-10-CM | POA: Diagnosis not present

## 2023-03-18 DIAGNOSIS — J309 Allergic rhinitis, unspecified: Secondary | ICD-10-CM | POA: Diagnosis not present

## 2023-03-18 DIAGNOSIS — G43909 Migraine, unspecified, not intractable, without status migrainosus: Secondary | ICD-10-CM | POA: Diagnosis not present

## 2023-03-20 DIAGNOSIS — C7641 Malignant neoplasm of right upper limb: Secondary | ICD-10-CM | POA: Diagnosis not present

## 2023-03-20 DIAGNOSIS — Z483 Aftercare following surgery for neoplasm: Secondary | ICD-10-CM | POA: Diagnosis not present

## 2023-03-20 DIAGNOSIS — G43909 Migraine, unspecified, not intractable, without status migrainosus: Secondary | ICD-10-CM | POA: Diagnosis not present

## 2023-03-20 DIAGNOSIS — Z471 Aftercare following joint replacement surgery: Secondary | ICD-10-CM | POA: Diagnosis not present

## 2023-03-20 DIAGNOSIS — J309 Allergic rhinitis, unspecified: Secondary | ICD-10-CM | POA: Diagnosis not present

## 2023-03-20 DIAGNOSIS — M5116 Intervertebral disc disorders with radiculopathy, lumbar region: Secondary | ICD-10-CM | POA: Diagnosis not present

## 2023-03-20 DIAGNOSIS — Z96611 Presence of right artificial shoulder joint: Secondary | ICD-10-CM | POA: Diagnosis not present

## 2023-03-20 DIAGNOSIS — G4700 Insomnia, unspecified: Secondary | ICD-10-CM | POA: Diagnosis not present

## 2023-03-20 DIAGNOSIS — G4730 Sleep apnea, unspecified: Secondary | ICD-10-CM | POA: Diagnosis not present

## 2023-03-21 DIAGNOSIS — Z483 Aftercare following surgery for neoplasm: Secondary | ICD-10-CM | POA: Diagnosis not present

## 2023-03-21 DIAGNOSIS — J309 Allergic rhinitis, unspecified: Secondary | ICD-10-CM | POA: Diagnosis not present

## 2023-03-21 DIAGNOSIS — C7641 Malignant neoplasm of right upper limb: Secondary | ICD-10-CM | POA: Diagnosis not present

## 2023-03-21 DIAGNOSIS — Z96611 Presence of right artificial shoulder joint: Secondary | ICD-10-CM | POA: Diagnosis not present

## 2023-03-21 DIAGNOSIS — Z471 Aftercare following joint replacement surgery: Secondary | ICD-10-CM | POA: Diagnosis not present

## 2023-03-21 DIAGNOSIS — M5116 Intervertebral disc disorders with radiculopathy, lumbar region: Secondary | ICD-10-CM | POA: Diagnosis not present

## 2023-03-21 DIAGNOSIS — G4700 Insomnia, unspecified: Secondary | ICD-10-CM | POA: Diagnosis not present

## 2023-03-21 DIAGNOSIS — G4730 Sleep apnea, unspecified: Secondary | ICD-10-CM | POA: Diagnosis not present

## 2023-03-21 DIAGNOSIS — G43909 Migraine, unspecified, not intractable, without status migrainosus: Secondary | ICD-10-CM | POA: Diagnosis not present

## 2023-03-25 DIAGNOSIS — J309 Allergic rhinitis, unspecified: Secondary | ICD-10-CM | POA: Diagnosis not present

## 2023-03-25 DIAGNOSIS — G4730 Sleep apnea, unspecified: Secondary | ICD-10-CM | POA: Diagnosis not present

## 2023-03-25 DIAGNOSIS — G4700 Insomnia, unspecified: Secondary | ICD-10-CM | POA: Diagnosis not present

## 2023-03-25 DIAGNOSIS — G43909 Migraine, unspecified, not intractable, without status migrainosus: Secondary | ICD-10-CM | POA: Diagnosis not present

## 2023-03-25 DIAGNOSIS — M5116 Intervertebral disc disorders with radiculopathy, lumbar region: Secondary | ICD-10-CM | POA: Diagnosis not present

## 2023-03-25 DIAGNOSIS — Z483 Aftercare following surgery for neoplasm: Secondary | ICD-10-CM | POA: Diagnosis not present

## 2023-03-25 DIAGNOSIS — Z471 Aftercare following joint replacement surgery: Secondary | ICD-10-CM | POA: Diagnosis not present

## 2023-03-25 DIAGNOSIS — C7641 Malignant neoplasm of right upper limb: Secondary | ICD-10-CM | POA: Diagnosis not present

## 2023-03-25 DIAGNOSIS — Z96611 Presence of right artificial shoulder joint: Secondary | ICD-10-CM | POA: Diagnosis not present

## 2023-03-28 DIAGNOSIS — G4734 Idiopathic sleep related nonobstructive alveolar hypoventilation: Secondary | ICD-10-CM | POA: Diagnosis not present

## 2023-03-28 DIAGNOSIS — G4700 Insomnia, unspecified: Secondary | ICD-10-CM | POA: Diagnosis not present

## 2023-03-28 DIAGNOSIS — M5116 Intervertebral disc disorders with radiculopathy, lumbar region: Secondary | ICD-10-CM | POA: Diagnosis not present

## 2023-03-28 DIAGNOSIS — G4730 Sleep apnea, unspecified: Secondary | ICD-10-CM | POA: Diagnosis not present

## 2023-03-28 DIAGNOSIS — G43909 Migraine, unspecified, not intractable, without status migrainosus: Secondary | ICD-10-CM | POA: Diagnosis not present

## 2023-03-28 DIAGNOSIS — J309 Allergic rhinitis, unspecified: Secondary | ICD-10-CM | POA: Diagnosis not present

## 2023-03-28 DIAGNOSIS — Z96611 Presence of right artificial shoulder joint: Secondary | ICD-10-CM | POA: Diagnosis not present

## 2023-03-28 DIAGNOSIS — C7641 Malignant neoplasm of right upper limb: Secondary | ICD-10-CM | POA: Diagnosis not present

## 2023-03-28 DIAGNOSIS — Z471 Aftercare following joint replacement surgery: Secondary | ICD-10-CM | POA: Diagnosis not present

## 2023-03-28 DIAGNOSIS — Z483 Aftercare following surgery for neoplasm: Secondary | ICD-10-CM | POA: Diagnosis not present

## 2023-04-01 DIAGNOSIS — Z96611 Presence of right artificial shoulder joint: Secondary | ICD-10-CM | POA: Diagnosis not present

## 2023-04-01 DIAGNOSIS — Z471 Aftercare following joint replacement surgery: Secondary | ICD-10-CM | POA: Diagnosis not present

## 2023-04-01 DIAGNOSIS — M5116 Intervertebral disc disorders with radiculopathy, lumbar region: Secondary | ICD-10-CM | POA: Diagnosis not present

## 2023-04-01 DIAGNOSIS — G43909 Migraine, unspecified, not intractable, without status migrainosus: Secondary | ICD-10-CM | POA: Diagnosis not present

## 2023-04-01 DIAGNOSIS — J309 Allergic rhinitis, unspecified: Secondary | ICD-10-CM | POA: Diagnosis not present

## 2023-04-01 DIAGNOSIS — C7641 Malignant neoplasm of right upper limb: Secondary | ICD-10-CM | POA: Diagnosis not present

## 2023-04-01 DIAGNOSIS — G4700 Insomnia, unspecified: Secondary | ICD-10-CM | POA: Diagnosis not present

## 2023-04-01 DIAGNOSIS — G4730 Sleep apnea, unspecified: Secondary | ICD-10-CM | POA: Diagnosis not present

## 2023-04-01 DIAGNOSIS — Z483 Aftercare following surgery for neoplasm: Secondary | ICD-10-CM | POA: Diagnosis not present

## 2023-04-04 DIAGNOSIS — Z471 Aftercare following joint replacement surgery: Secondary | ICD-10-CM | POA: Diagnosis not present

## 2023-04-04 DIAGNOSIS — Z96611 Presence of right artificial shoulder joint: Secondary | ICD-10-CM | POA: Diagnosis not present

## 2023-04-04 DIAGNOSIS — Z483 Aftercare following surgery for neoplasm: Secondary | ICD-10-CM | POA: Diagnosis not present

## 2023-04-04 DIAGNOSIS — J309 Allergic rhinitis, unspecified: Secondary | ICD-10-CM | POA: Diagnosis not present

## 2023-04-04 DIAGNOSIS — C7641 Malignant neoplasm of right upper limb: Secondary | ICD-10-CM | POA: Diagnosis not present

## 2023-04-04 DIAGNOSIS — G4700 Insomnia, unspecified: Secondary | ICD-10-CM | POA: Diagnosis not present

## 2023-04-04 DIAGNOSIS — G4730 Sleep apnea, unspecified: Secondary | ICD-10-CM | POA: Diagnosis not present

## 2023-04-04 DIAGNOSIS — M5116 Intervertebral disc disorders with radiculopathy, lumbar region: Secondary | ICD-10-CM | POA: Diagnosis not present

## 2023-04-04 DIAGNOSIS — G43909 Migraine, unspecified, not intractable, without status migrainosus: Secondary | ICD-10-CM | POA: Diagnosis not present

## 2023-04-07 DIAGNOSIS — G4700 Insomnia, unspecified: Secondary | ICD-10-CM | POA: Diagnosis not present

## 2023-04-07 DIAGNOSIS — Z483 Aftercare following surgery for neoplasm: Secondary | ICD-10-CM | POA: Diagnosis not present

## 2023-04-07 DIAGNOSIS — G4730 Sleep apnea, unspecified: Secondary | ICD-10-CM | POA: Diagnosis not present

## 2023-04-07 DIAGNOSIS — G43909 Migraine, unspecified, not intractable, without status migrainosus: Secondary | ICD-10-CM | POA: Diagnosis not present

## 2023-04-07 DIAGNOSIS — J309 Allergic rhinitis, unspecified: Secondary | ICD-10-CM | POA: Diagnosis not present

## 2023-04-07 DIAGNOSIS — Z96611 Presence of right artificial shoulder joint: Secondary | ICD-10-CM | POA: Diagnosis not present

## 2023-04-07 DIAGNOSIS — M5116 Intervertebral disc disorders with radiculopathy, lumbar region: Secondary | ICD-10-CM | POA: Diagnosis not present

## 2023-04-07 DIAGNOSIS — Z471 Aftercare following joint replacement surgery: Secondary | ICD-10-CM | POA: Diagnosis not present

## 2023-04-07 DIAGNOSIS — C7641 Malignant neoplasm of right upper limb: Secondary | ICD-10-CM | POA: Diagnosis not present

## 2023-04-10 DIAGNOSIS — M5116 Intervertebral disc disorders with radiculopathy, lumbar region: Secondary | ICD-10-CM | POA: Diagnosis not present

## 2023-04-10 DIAGNOSIS — Z96611 Presence of right artificial shoulder joint: Secondary | ICD-10-CM | POA: Diagnosis not present

## 2023-04-10 DIAGNOSIS — G43909 Migraine, unspecified, not intractable, without status migrainosus: Secondary | ICD-10-CM | POA: Diagnosis not present

## 2023-04-10 DIAGNOSIS — Z483 Aftercare following surgery for neoplasm: Secondary | ICD-10-CM | POA: Diagnosis not present

## 2023-04-10 DIAGNOSIS — G4730 Sleep apnea, unspecified: Secondary | ICD-10-CM | POA: Diagnosis not present

## 2023-04-10 DIAGNOSIS — C7641 Malignant neoplasm of right upper limb: Secondary | ICD-10-CM | POA: Diagnosis not present

## 2023-04-10 DIAGNOSIS — J309 Allergic rhinitis, unspecified: Secondary | ICD-10-CM | POA: Diagnosis not present

## 2023-04-10 DIAGNOSIS — G4700 Insomnia, unspecified: Secondary | ICD-10-CM | POA: Diagnosis not present

## 2023-04-10 DIAGNOSIS — Z471 Aftercare following joint replacement surgery: Secondary | ICD-10-CM | POA: Diagnosis not present

## 2023-04-14 DIAGNOSIS — G4700 Insomnia, unspecified: Secondary | ICD-10-CM | POA: Diagnosis not present

## 2023-04-14 DIAGNOSIS — M5116 Intervertebral disc disorders with radiculopathy, lumbar region: Secondary | ICD-10-CM | POA: Diagnosis not present

## 2023-04-14 DIAGNOSIS — Z483 Aftercare following surgery for neoplasm: Secondary | ICD-10-CM | POA: Diagnosis not present

## 2023-04-14 DIAGNOSIS — G4730 Sleep apnea, unspecified: Secondary | ICD-10-CM | POA: Diagnosis not present

## 2023-04-14 DIAGNOSIS — Z471 Aftercare following joint replacement surgery: Secondary | ICD-10-CM | POA: Diagnosis not present

## 2023-04-14 DIAGNOSIS — G43909 Migraine, unspecified, not intractable, without status migrainosus: Secondary | ICD-10-CM | POA: Diagnosis not present

## 2023-04-14 DIAGNOSIS — J309 Allergic rhinitis, unspecified: Secondary | ICD-10-CM | POA: Diagnosis not present

## 2023-04-14 DIAGNOSIS — C7641 Malignant neoplasm of right upper limb: Secondary | ICD-10-CM | POA: Diagnosis not present

## 2023-04-14 DIAGNOSIS — Z96611 Presence of right artificial shoulder joint: Secondary | ICD-10-CM | POA: Diagnosis not present

## 2023-04-16 DIAGNOSIS — Z483 Aftercare following surgery for neoplasm: Secondary | ICD-10-CM | POA: Diagnosis not present

## 2023-04-16 DIAGNOSIS — M5116 Intervertebral disc disorders with radiculopathy, lumbar region: Secondary | ICD-10-CM | POA: Diagnosis not present

## 2023-04-16 DIAGNOSIS — C7641 Malignant neoplasm of right upper limb: Secondary | ICD-10-CM | POA: Diagnosis not present

## 2023-04-16 DIAGNOSIS — Z471 Aftercare following joint replacement surgery: Secondary | ICD-10-CM | POA: Diagnosis not present

## 2023-04-16 DIAGNOSIS — Z96611 Presence of right artificial shoulder joint: Secondary | ICD-10-CM | POA: Diagnosis not present

## 2023-04-16 DIAGNOSIS — G4730 Sleep apnea, unspecified: Secondary | ICD-10-CM | POA: Diagnosis not present

## 2023-04-16 DIAGNOSIS — G43909 Migraine, unspecified, not intractable, without status migrainosus: Secondary | ICD-10-CM | POA: Diagnosis not present

## 2023-04-16 DIAGNOSIS — G4700 Insomnia, unspecified: Secondary | ICD-10-CM | POA: Diagnosis not present

## 2023-04-16 DIAGNOSIS — J309 Allergic rhinitis, unspecified: Secondary | ICD-10-CM | POA: Diagnosis not present

## 2023-04-21 DIAGNOSIS — Z96611 Presence of right artificial shoulder joint: Secondary | ICD-10-CM | POA: Diagnosis not present

## 2023-04-21 DIAGNOSIS — M7541 Impingement syndrome of right shoulder: Secondary | ICD-10-CM | POA: Diagnosis not present

## 2023-04-22 DIAGNOSIS — J309 Allergic rhinitis, unspecified: Secondary | ICD-10-CM | POA: Diagnosis not present

## 2023-04-22 DIAGNOSIS — Z483 Aftercare following surgery for neoplasm: Secondary | ICD-10-CM | POA: Diagnosis not present

## 2023-04-22 DIAGNOSIS — C7641 Malignant neoplasm of right upper limb: Secondary | ICD-10-CM | POA: Diagnosis not present

## 2023-04-22 DIAGNOSIS — G43909 Migraine, unspecified, not intractable, without status migrainosus: Secondary | ICD-10-CM | POA: Diagnosis not present

## 2023-04-22 DIAGNOSIS — G4730 Sleep apnea, unspecified: Secondary | ICD-10-CM | POA: Diagnosis not present

## 2023-04-22 DIAGNOSIS — Z471 Aftercare following joint replacement surgery: Secondary | ICD-10-CM | POA: Diagnosis not present

## 2023-04-22 DIAGNOSIS — G4700 Insomnia, unspecified: Secondary | ICD-10-CM | POA: Diagnosis not present

## 2023-04-22 DIAGNOSIS — M5116 Intervertebral disc disorders with radiculopathy, lumbar region: Secondary | ICD-10-CM | POA: Diagnosis not present

## 2023-04-22 DIAGNOSIS — Z96611 Presence of right artificial shoulder joint: Secondary | ICD-10-CM | POA: Diagnosis not present

## 2023-04-25 DIAGNOSIS — C7641 Malignant neoplasm of right upper limb: Secondary | ICD-10-CM | POA: Diagnosis not present

## 2023-04-25 DIAGNOSIS — G4700 Insomnia, unspecified: Secondary | ICD-10-CM | POA: Diagnosis not present

## 2023-04-25 DIAGNOSIS — G43909 Migraine, unspecified, not intractable, without status migrainosus: Secondary | ICD-10-CM | POA: Diagnosis not present

## 2023-04-25 DIAGNOSIS — M5116 Intervertebral disc disorders with radiculopathy, lumbar region: Secondary | ICD-10-CM | POA: Diagnosis not present

## 2023-04-25 DIAGNOSIS — Z483 Aftercare following surgery for neoplasm: Secondary | ICD-10-CM | POA: Diagnosis not present

## 2023-04-25 DIAGNOSIS — Z471 Aftercare following joint replacement surgery: Secondary | ICD-10-CM | POA: Diagnosis not present

## 2023-04-25 DIAGNOSIS — Z96611 Presence of right artificial shoulder joint: Secondary | ICD-10-CM | POA: Diagnosis not present

## 2023-04-25 DIAGNOSIS — G4730 Sleep apnea, unspecified: Secondary | ICD-10-CM | POA: Diagnosis not present

## 2023-04-25 DIAGNOSIS — J309 Allergic rhinitis, unspecified: Secondary | ICD-10-CM | POA: Diagnosis not present

## 2023-04-27 DIAGNOSIS — G4734 Idiopathic sleep related nonobstructive alveolar hypoventilation: Secondary | ICD-10-CM | POA: Diagnosis not present

## 2023-04-29 DIAGNOSIS — G4700 Insomnia, unspecified: Secondary | ICD-10-CM | POA: Diagnosis not present

## 2023-04-29 DIAGNOSIS — G43909 Migraine, unspecified, not intractable, without status migrainosus: Secondary | ICD-10-CM | POA: Diagnosis not present

## 2023-04-29 DIAGNOSIS — C7641 Malignant neoplasm of right upper limb: Secondary | ICD-10-CM | POA: Diagnosis not present

## 2023-04-29 DIAGNOSIS — Z471 Aftercare following joint replacement surgery: Secondary | ICD-10-CM | POA: Diagnosis not present

## 2023-04-29 DIAGNOSIS — J309 Allergic rhinitis, unspecified: Secondary | ICD-10-CM | POA: Diagnosis not present

## 2023-04-29 DIAGNOSIS — Z483 Aftercare following surgery for neoplasm: Secondary | ICD-10-CM | POA: Diagnosis not present

## 2023-04-29 DIAGNOSIS — G4730 Sleep apnea, unspecified: Secondary | ICD-10-CM | POA: Diagnosis not present

## 2023-04-29 DIAGNOSIS — M5116 Intervertebral disc disorders with radiculopathy, lumbar region: Secondary | ICD-10-CM | POA: Diagnosis not present

## 2023-04-29 DIAGNOSIS — Z96611 Presence of right artificial shoulder joint: Secondary | ICD-10-CM | POA: Diagnosis not present

## 2023-05-07 DIAGNOSIS — M5116 Intervertebral disc disorders with radiculopathy, lumbar region: Secondary | ICD-10-CM | POA: Diagnosis not present

## 2023-05-07 DIAGNOSIS — G43909 Migraine, unspecified, not intractable, without status migrainosus: Secondary | ICD-10-CM | POA: Diagnosis not present

## 2023-05-07 DIAGNOSIS — J309 Allergic rhinitis, unspecified: Secondary | ICD-10-CM | POA: Diagnosis not present

## 2023-05-07 DIAGNOSIS — G4700 Insomnia, unspecified: Secondary | ICD-10-CM | POA: Diagnosis not present

## 2023-05-07 DIAGNOSIS — Z483 Aftercare following surgery for neoplasm: Secondary | ICD-10-CM | POA: Diagnosis not present

## 2023-05-07 DIAGNOSIS — Z96611 Presence of right artificial shoulder joint: Secondary | ICD-10-CM | POA: Diagnosis not present

## 2023-05-07 DIAGNOSIS — Z471 Aftercare following joint replacement surgery: Secondary | ICD-10-CM | POA: Diagnosis not present

## 2023-05-07 DIAGNOSIS — G4730 Sleep apnea, unspecified: Secondary | ICD-10-CM | POA: Diagnosis not present

## 2023-05-07 DIAGNOSIS — C7641 Malignant neoplasm of right upper limb: Secondary | ICD-10-CM | POA: Diagnosis not present

## 2023-05-13 DIAGNOSIS — Z4789 Encounter for other orthopedic aftercare: Secondary | ICD-10-CM | POA: Diagnosis not present

## 2023-05-13 DIAGNOSIS — Z96611 Presence of right artificial shoulder joint: Secondary | ICD-10-CM | POA: Diagnosis not present

## 2023-05-13 DIAGNOSIS — M25511 Pain in right shoulder: Secondary | ICD-10-CM | POA: Diagnosis not present

## 2023-05-21 DIAGNOSIS — Z4789 Encounter for other orthopedic aftercare: Secondary | ICD-10-CM | POA: Diagnosis not present

## 2023-05-21 DIAGNOSIS — Z96611 Presence of right artificial shoulder joint: Secondary | ICD-10-CM | POA: Diagnosis not present

## 2023-05-21 DIAGNOSIS — M25511 Pain in right shoulder: Secondary | ICD-10-CM | POA: Diagnosis not present

## 2023-05-26 DIAGNOSIS — Z96611 Presence of right artificial shoulder joint: Secondary | ICD-10-CM | POA: Diagnosis not present

## 2023-05-26 DIAGNOSIS — Z4789 Encounter for other orthopedic aftercare: Secondary | ICD-10-CM | POA: Diagnosis not present

## 2023-05-26 DIAGNOSIS — M25511 Pain in right shoulder: Secondary | ICD-10-CM | POA: Diagnosis not present

## 2023-05-28 DIAGNOSIS — G4734 Idiopathic sleep related nonobstructive alveolar hypoventilation: Secondary | ICD-10-CM | POA: Diagnosis not present

## 2023-06-05 DIAGNOSIS — Z96611 Presence of right artificial shoulder joint: Secondary | ICD-10-CM | POA: Diagnosis not present

## 2023-06-05 DIAGNOSIS — Z4789 Encounter for other orthopedic aftercare: Secondary | ICD-10-CM | POA: Diagnosis not present

## 2023-06-05 DIAGNOSIS — M25511 Pain in right shoulder: Secondary | ICD-10-CM | POA: Diagnosis not present

## 2023-06-11 ENCOUNTER — Encounter: Payer: Self-pay | Admitting: Nurse Practitioner

## 2023-06-11 ENCOUNTER — Ambulatory Visit (INDEPENDENT_AMBULATORY_CARE_PROVIDER_SITE_OTHER): Payer: Medicare Other | Admitting: Nurse Practitioner

## 2023-06-11 VITALS — BP 138/82 | HR 78 | Temp 98.0°F | Resp 16 | Ht 60.0 in | Wt 173.6 lb

## 2023-06-11 DIAGNOSIS — E039 Hypothyroidism, unspecified: Secondary | ICD-10-CM | POA: Diagnosis not present

## 2023-06-11 DIAGNOSIS — G4719 Other hypersomnia: Secondary | ICD-10-CM

## 2023-06-11 DIAGNOSIS — R300 Dysuria: Secondary | ICD-10-CM | POA: Diagnosis not present

## 2023-06-11 DIAGNOSIS — I700 Atherosclerosis of aorta: Secondary | ICD-10-CM

## 2023-06-11 DIAGNOSIS — E559 Vitamin D deficiency, unspecified: Secondary | ICD-10-CM

## 2023-06-11 DIAGNOSIS — E210 Primary hyperparathyroidism: Secondary | ICD-10-CM

## 2023-06-11 DIAGNOSIS — E538 Deficiency of other specified B group vitamins: Secondary | ICD-10-CM

## 2023-06-11 DIAGNOSIS — Z0001 Encounter for general adult medical examination with abnormal findings: Secondary | ICD-10-CM | POA: Diagnosis not present

## 2023-06-11 NOTE — Progress Notes (Signed)
[facility] [address] [place], [place] [address]  Internal MEDICINE  Office Visit Note  Patient Name: Danielle Nash  [id number]  [id number]  Date of Service: 06/11/2023  Chief Complaint  Patient presents with   Depression   Hyperlipidemia   Medicare Wellness    HPI Dalaina presents for an annual well visit and physical exam.  Well-appearing 72 y.o. female/female with [PMH]  Routine CRC screening:cologuard due in 2025 Routine mammogram: declined  DEXA scan: done last year Labs: due for routine labs  New or worsening pain: Other concerns:       06/11/2023    2:53 PM 01/25/2021    2:01 PM 01/27/2020    3:08 PM  MMSE - Mini Mental State Exam  Orientation to time 5 5 5   Orientation to Place 5 5 5   Registration 3 3 3   Attention/ Calculation 5 5 5   Recall 3 3 3   Language- name 2 objects 2 2 2   Language- repeat 1 1 1   Language- follow 3 step command 3 3 3   Language- read & follow direction 1 1 1   Write a sentence 1 1 1   Copy design 1 1 1   Total score 30 30 30     Functional Status Survey: Is the patient deaf or have difficulty hearing?: Yes Does the patient have difficulty seeing, even when wearing glasses/contacts?: No Does the patient have difficulty concentrating, remembering, or making decisions?: Yes Does the patient have difficulty walking or climbing stairs?: Yes Does the patient have difficulty dressing or bathing?: No Does the patient have difficulty doing errands alone such as visiting a doctor's office or shopping?: No     01/23/2022    9:32 AM 01/30/2022    8:28 AM 04/25/2022    8:37 AM 10/02/2022   10:45 AM 06/11/2023    2:51 PM  Fall Risk  Falls in the past year? 0 0 0 0 0  Was there an injury with Fall? 0 0  0 0  Fall Risk Category Calculator 0 0  0 0  Fall Risk Category (Retired) Low Low  Low   (RETIRED) Patient Fall Risk Level  Low fall risk Low fall risk Low fall risk   Patient at Risk for Falls Due to No Fall Risks  No Fall Risks No  Fall Risks No Fall Risks  Fall risk Follow up Falls evaluation completed Falls evaluation completed;Falls prevention discussed Falls evaluation completed Falls evaluation completed Falls evaluation completed       10/02/2022   10:45 AM  Depression screen PHQ 2/9  Decreased Interest 2  Down, Depressed, Hopeless 1  PHQ - 2 Score 3  Altered sleeping 3  Tired, decreased energy 3  Change in appetite 0  Feeling bad or failure about yourself  2  Trouble concentrating 0  Moving slowly or fidgety/restless 0  Suicidal thoughts 0  PHQ-9 Score 11  Difficult doing work/chores Somewhat difficult       01/23/2022    9:32 AM 10/19/2021   11:01 AM  GAD 7 : Generalized Anxiety Score  Nervous, Anxious, on Edge 0 1  Control/stop worrying 0 0  Worry too much - different things 0 1  Trouble relaxing 2 2  Restless 0 0  Easily annoyed or irritable 1 2  Afraid - awful might happen 0 0  Total GAD 7 Score 3 6  Anxiety Difficulty  Not difficult at all      Current Medication: Outpatient Encounter Medications as of 06/11/2023  Medication  Sig   Ascorbic Acid (VITAMIN C PO) Take 1,000 mg by mouth daily.   B Complex Vitamins (VITAMIN-B COMPLEX) TABS Take 1 tablet by mouth 1 day or 1 dose.   Biotin 5000 MCG TABS Take by mouth daily.   Cholecalciferol (VITAMIN D) 2000 units tablet Take 5,000 Units by mouth 1 day or 1 dose.   dicyclomine (BENTYL) 10 MG capsule Take 1 capsule (10 mg total) by mouth 4 (four) times daily -  before meals and at bedtime.   Emollient (COLLAGEN EX) Apply 1 tablet topically daily. 5 vitamins and minerals   FOLIC ACID PO Take by mouth daily.   Iodine, Kelp, (KELP PO) Take 325 mg by mouth daily.   Krill Oil 1000 MG CAPS Take by mouth daily. Krill Oil   loratadine (CLARITIN) 10 MG tablet Take 10 mg by mouth daily.   Magnesium 500 MG CAPS Take by mouth.   OVER THE COUNTER MEDICATION 1 tablet daily. Annatrol bone and heart support   OVER THE COUNTER MEDICATION Take 2 tablets by  mouth daily. Liposomal - D3 - K2 125 mg - 100mg    OVER THE COUNTER MEDICATION Take 2 tablets by mouth daily. Osteo - Organic  Cal plus 5000iu-03, magnesium 400 mg   OVER THE COUNTER MEDICATION Take 2 tablets by mouth daily. Bone strength, calcium 60 mg - D3 50 mcg - K2 - mag 380 mg   Probiotic Product (PROBIOTIC & ACIDOPHILUS EX ST) CAPS Take 1 tablet by mouth 1 day or 1 dose.   triamcinolone cream (KENALOG) 0.1 % Apply 1 Application topically as needed.   Turmeric (QC TUMERIC COMPLEX PO) Take 1 tablet by mouth daily. With washabi extract   vitamin B-12 (CYANOCOBALAMIN) 1000 MCG tablet Take 1 tablet by mouth 1 day or 1 dose.   Zinc Acetate, Oral, (ZINC ACETATE PO) Take 100 mg by mouth 2 (two) times daily.   [DISCONTINUED] COLOSTRUM PO Take by mouth daily. 40 % 1 gram   [DISCONTINUED] acetaminophen (TYLENOL) 500 MG tablet Take 1,000 mg by mouth every 6 (six) hours as needed. (Patient not taking: Reported on 06/11/2023)   [DISCONTINUED] [employer] EVENING PRIMROSE OIL PO Take 1,300 mg by mouth daily. (Patient not taking: Reported on 06/11/2023)   [DISCONTINUED] magnesium gluconate (MAGONATE) 500 MG tablet Take 500 mg by mouth 2 (two) times daily. (Patient not taking: Reported on 06/11/2023)   [DISCONTINUED] Multiple Vitamin (MULTIVITAMIN) capsule Take 1 capsule by mouth daily. (Patient not taking: Reported on 06/11/2023)   [DISCONTINUED] OVER THE COUNTER MEDICATION 1 tablet daily. Thyroid Support with iodine (Patient not taking: Reported on 06/11/2023)   [DISCONTINUED] OVER THE COUNTER MEDICATION 1 tablet daily. Cognitive memory and brain health (Patient not taking: Reported on 06/11/2023)   [DISCONTINUED] OVER THE COUNTER MEDICATION 1 tablet daily. Sleep optimizer - valerain ltrytopen Hops, lemon balm (Patient not taking: Reported on 06/11/2023)   [DISCONTINUED] OVER THE COUNTER MEDICATION Take 100 mg by mouth daily. 2 tabs High absorbtion magnesium (Patient not taking: Reported on 06/11/2023)   [DISCONTINUED] OVER  THE COUNTER MEDICATION 1 tablet daily. Grass feed Resiccated Beef Liver 3000 mcg (Patient not taking: Reported on 06/11/2023)   [DISCONTINUED] oxyCODONE (ROXICODONE) 5 MG immediate release tablet Take 1-2 tablets (5-10 mg total) by mouth every 4 (four) hours as needed for moderate pain or severe pain. (Patient not taking: Reported on 06/11/2023)   [DISCONTINUED] vitamin E 400 UNIT capsule Take 1 capsule by mouth 1 day or 1 dose. (Patient not taking: Reported on 06/11/2023)  No facility-administered encounter medications on file as of 06/11/2023.    Surgical History: Past Surgical History:  Procedure Laterality Date   COLONOSCOPY     HERNIA REPAIR     INCISIONAL HERNIA REPAIR N/A 09/05/2017   Procedure: HERNIA REPAIR INCISIONAL;  Surgeon: Ancil Linsey, MD;  Location: [facility] ORS;  Service: General;  Laterality: N/A;   KNEE ARTHROSCOPY     she is not sure of which knee   LAPAROSCOPIC APPENDECTOMY N/A 10/19/2016   Procedure: APPENDECTOMY LAPAROSCOPIC;  Surgeon: Tiney Rouge III, MD;  Location: [facility];  Service: General;  Laterality: N/A;   MASS EXCISION Right 03/11/2023   Procedure: EXCISION OF BENIGN NEOPLASM;  Surgeon: Christena Flake, MD;  Location: [facility] OR[facility];  Service: Orthopedics;  Laterality: Right;   MEDIAL PARTIAL KNEE REPLACEMENT Bilateral    REVERSE SHOULDER ARTHROPLASTY Right 03/11/2023   Procedure: REVERSE SHOULDER ARTHROPLASTY;  Surgeon: Christena Flake, MD;  Location: [facility] OR[facility];  Service: Orthopedics;  Laterality: Right;   THYROID SURGERY      Medical History: Past Medical History:  Diagnosis Date   Acute appendicitis 03/11/2016   Allergy    Allergy-induced asthma    Anxiety    Arthritis    Depression    Frequent headaches    History of kidney stones    Hypercalcemia 03/18/2019   Hyperlipidemia    Irritable bowel syndrome (IBS)    Neuropathy    PONV (postoperative nausea and vomiting)    Thyroid disease     Family History: Family History  Problem Relation Age of Onset    Arthritis Mother    Diabetes Mother    Heart disease Father    Breast cancer Sister    Diabetes Maternal Uncle     Social History   Socioeconomic History   Marital status: Married    Spouse name: Iantha Fallen   Number of children: Not on file   Years of education: Not on file   Highest education level: Not on file  Occupational History   Not on file  Tobacco Use   Smoking status: Never   Smokeless tobacco: Never  Vaping Use   Vaping Use: Never used  Substance and Sexual Activity   Alcohol use: No    Alcohol/week: 0.0 standard drinks of alcohol   Drug use: No   Sexual activity: Never  Other Topics Concern   Not on file  Social History Narrative   Not on file   Social Determinants of Health   Financial Resource Strain: Low Risk  (01/30/2022)   Overall Financial Resource Strain (CARDIA)    Difficulty of Paying Living Expenses: Not hard at all  Food Insecurity: No Food Insecurity (01/30/2022)   Hunger Vital Sign    Worried About Running Out of Food in the Last Year: Never true    Ran Out of Food in the Last Year: Never true  Transportation Needs: No Transportation Needs (01/30/2022)   PRAPARE - Administrator, Civil Service (Medical): No    Lack of Transportation (Non-Medical): No  Physical Activity: Insufficiently Active (01/30/2022)   Exercise Vital Sign    Days of Exercise per Week: 3 days    Minutes of Exercise per Session: 20 min  Stress: No Stress Concern Present (01/30/2022)   [employer] of Occupational Health - Occupational Stress Questionnaire    Feeling of Stress : Only a little  Social Connections: Socially Integrated (01/30/2022)   Social Connection and Isolation Panel [NHANES]    Frequency of Communication  with Friends and Family: Twice a week    Frequency of Social Gatherings with Friends and Family: Once a week    Attends Religious Services: More than 4 times per year    Active Member of [employer] or Organizations: Yes    Attends Museum/gallery exhibitions officer: More than 4 times per year    Marital Status: Married  Catering manager Violence: Not At Risk (01/30/2022)   Humiliation, Afraid, Rape, and Kick questionnaire    Fear of Current or Ex-Partner: No    Emotionally Abused: No    Physically Abused: No    Sexually Abused: No      Review of Systems  Vital Signs: BP 138/82   Pulse 78   Temp 98 F (36.7 C)   Resp 16   Ht 5' (1.524 m)   Wt 173 lb 9.6 oz (78.7 kg)   SpO2 95%   BMI 33.90 kg/m    Physical Exam     Assessment/Plan: 1. Dysuria - UA/M w/rflx Culture, Routine - Lipid Profile - TSH + free T4 - B12 and Folate Panel - Vitamin D (25 hydroxy) - Iron, TIBC and Ferritin Panel - Magnesium - Calcium  2. Aortic atherosclerosis (HCC) - Lipid Profile - TSH + free T4 - B12 and Folate Panel - Vitamin D (25 hydroxy) - Iron, TIBC and Ferritin Panel - Magnesium - Calcium  3. Primary hyperparathyroidism (HCC) - Lipid Profile - TSH + free T4 - B12 and Folate Panel - Vitamin D (25 hydroxy) - Iron, TIBC and Ferritin Panel - Magnesium - Calcium  4. Excessive daytime sleepiness - Lipid Profile - TSH + free T4 - B12 and Folate Panel - Vitamin D (25 hydroxy) - Iron, TIBC and Ferritin Panel - Magnesium - Calcium  5. Hypothyroidism, unspecified type - Lipid Profile - TSH + free T4 - B12 and Folate Panel - Vitamin D (25 hydroxy) - Iron, TIBC and Ferritin Panel - Magnesium - Calcium  6. B12 deficiency - Lipid Profile - TSH + free T4 - B12 and Folate Panel - Vitamin D (25 hydroxy) - Iron, TIBC and Ferritin Panel - Magnesium - Calcium  7. Vitamin D deficiency - Lipid Profile - TSH + free T4 - B12 and Folate Panel - Vitamin D (25 hydroxy) - Iron, TIBC and Ferritin Panel - Magnesium - Calcium  8. Encounter for routine adult health examination with abnormal findings - Lipid Profile - TSH + free T4 - B12 and Folate Panel - Vitamin D (25 hydroxy) - Iron, TIBC and Ferritin  Panel - Magnesium - Calcium     General Counseling: Jeily verbalizes understanding of the findings of todays visit and agrees with plan of treatment. I have discussed any further diagnostic evaluation that may be needed or ordered today. We also reviewed her medications today. she has been encouraged to call the office with any questions or concerns that should arise related to todays visit.    Orders Placed This Encounter  Procedures   UA/M w/rflx Culture, Routine   Lipid Profile   TSH + free T4   B12 and Folate Panel   Vitamin D (25 hydroxy)   Iron, TIBC and Ferritin Panel   Magnesium   Calcium    No orders of the defined types were placed in this encounter.   Return in about 6 months (around 12/11/2023) for F/U, Ammiel Guiney PCP.   Total time spent:*** Minutes Time spent includes review of chart, medications, test results, and follow up plan with the  patient.   [place] Controlled Substance Database was reviewed by me.  This patient was seen by Jonetta Osgood, FNP-C in collaboration with Dr. Clayborn Bigness as a part of collaborative care agreement.  Ellard Nan R. Valetta Fuller, MSN, FNP-C Internal medicine

## 2023-06-12 LAB — MICROSCOPIC EXAMINATION
Bacteria, UA: NONE SEEN
Casts: NONE SEEN /lpf
Epithelial Cells (non renal): NONE SEEN /hpf (ref 0–10)
RBC, Urine: NONE SEEN /hpf (ref 0–2)
WBC, UA: NONE SEEN /hpf (ref 0–5)

## 2023-06-12 LAB — UA/M W/RFLX CULTURE, ROUTINE
Bilirubin, UA: NEGATIVE
Glucose, UA: NEGATIVE
Ketones, UA: NEGATIVE
Leukocytes,UA: NEGATIVE
Nitrite, UA: NEGATIVE
Protein,UA: NEGATIVE
RBC, UA: NEGATIVE
Specific Gravity, UA: 1.007 (ref 1.005–1.030)
Urobilinogen, Ur: 0.2 mg/dL (ref 0.2–1.0)
pH, UA: 7.5 (ref 5.0–7.5)

## 2023-06-20 DIAGNOSIS — I700 Atherosclerosis of aorta: Secondary | ICD-10-CM | POA: Diagnosis not present

## 2023-06-20 DIAGNOSIS — R300 Dysuria: Secondary | ICD-10-CM | POA: Diagnosis not present

## 2023-06-20 DIAGNOSIS — D508 Other iron deficiency anemias: Secondary | ICD-10-CM | POA: Diagnosis not present

## 2023-06-20 DIAGNOSIS — E210 Primary hyperparathyroidism: Secondary | ICD-10-CM | POA: Diagnosis not present

## 2023-06-20 DIAGNOSIS — E039 Hypothyroidism, unspecified: Secondary | ICD-10-CM | POA: Diagnosis not present

## 2023-06-21 LAB — IRON,TIBC AND FERRITIN PANEL
Ferritin: 122 ng/mL (ref 15–150)
Iron Saturation: 25 % (ref 15–55)
Iron: 98 ug/dL (ref 27–139)
Total Iron Binding Capacity: 387 ug/dL (ref 250–450)
UIBC: 289 ug/dL (ref 118–369)

## 2023-06-21 LAB — LIPID PANEL
Chol/HDL Ratio: 2.9 ratio (ref 0.0–4.4)
Cholesterol, Total: 225 mg/dL — ABNORMAL HIGH (ref 100–199)
HDL: 78 mg/dL (ref >39)
LDL Chol Calc (NIH): 131 mg/dL — ABNORMAL HIGH (ref 0–99)
Triglycerides: 94 mg/dL (ref 0–149)
VLDL Cholesterol Cal: 16 mg/dL (ref 5–40)

## 2023-06-21 LAB — VITAMIN D 25 HYDROXY (VIT D DEFICIENCY, FRACTURES): Vit D, 25-Hydroxy: 127.2 ng/mL — ABNORMAL HIGH (ref 30.0–100.0)

## 2023-06-21 LAB — MAGNESIUM: Magnesium: 2.1 mg/dL (ref 1.6–2.3)

## 2023-06-21 LAB — TSH+FREE T4
Free T4: 1.65 ng/dL (ref 0.82–1.77)
TSH: 0.729 u[IU]/mL (ref 0.450–4.500)

## 2023-06-21 LAB — B12 AND FOLATE PANEL
Folate: >20 ng/mL (ref >3.0)
Vitamin B-12: >2000 pg/mL — ABNORMAL HIGH (ref 232–1245)

## 2023-06-21 LAB — CALCIUM: Calcium: 10.3 mg/dL (ref 8.7–10.3)

## 2023-06-23 ENCOUNTER — Telehealth: Payer: Self-pay

## 2023-06-23 NOTE — Progress Notes (Signed)
If possible, I would like to discuss her results at an office visit and have her bring ALL of her supplements with her so we can go through them all together.

## 2023-06-23 NOTE — Telephone Encounter (Signed)
-----   Message from Sallyanne Kuster, NP sent at 06/23/2023  8:28 AM EDT ----- If possible, I would like to discuss her results at an office visit and have her bring ALL of her supplements with her so we can go through them all together.

## 2023-06-23 NOTE — Telephone Encounter (Signed)
Patient notified and appointment was made.

## 2023-06-25 ENCOUNTER — Ambulatory Visit (INDEPENDENT_AMBULATORY_CARE_PROVIDER_SITE_OTHER): Payer: Medicare Other | Admitting: Nurse Practitioner

## 2023-06-25 ENCOUNTER — Encounter: Payer: Self-pay | Admitting: Nurse Practitioner

## 2023-06-25 VITALS — BP 136/80 | HR 94 | Temp 97.4°F | Resp 16 | Ht 60.0 in

## 2023-06-25 DIAGNOSIS — E210 Primary hyperparathyroidism: Secondary | ICD-10-CM | POA: Diagnosis not present

## 2023-06-25 DIAGNOSIS — E673 Hypervitaminosis D: Secondary | ICD-10-CM

## 2023-06-25 DIAGNOSIS — G4719 Other hypersomnia: Secondary | ICD-10-CM | POA: Diagnosis not present

## 2023-06-25 NOTE — Progress Notes (Signed)
[facility] [address] [place], [place] [address]  Internal MEDICINE  Office Visit Note  Patient Name: Danielle Nash  [id number]  [id number]  Date of Service: 06/25/2023  Chief Complaint  Patient presents with   Follow-up    HPI Danielle Nash presents for a follow-up visit to review lab results  Hypervitaminosis D -- level is 127. She is taking multiple supplements approx 14,000 units of vitamin D daily. This increases risk of cardiac arrhythmias and coma at the most severe. Current symptoms include fatigue, dizziness, lethargy, increased thirst, increased urination, decreased appetite, muscle weakness, nausea and borderline high calcium.  Magnesium level is normal  Calcium level is high normal Thyroid and iron panel are normal.  LDL remains high but is improving B12 is high but this is ok, does not pose any negative symptoms     Current Medication: Outpatient Encounter Medications as of 06/25/2023  Medication Sig   Ascorbic Acid (VITAMIN C PO) Take 1,000 mg by mouth daily.   B Complex Vitamins (VITAMIN-B COMPLEX) TABS Take 1 tablet by mouth 1 day or 1 dose.   Biotin 5000 MCG TABS Take by mouth daily.   Cholecalciferol (VITAMIN D) 2000 units tablet Take 5,000 Units by mouth 1 day or 1 dose.   dicyclomine (BENTYL) 10 MG capsule Take 1 capsule (10 mg total) by mouth 4 (four) times daily -  before meals and at bedtime.   Emollient (COLLAGEN EX) Apply 1 tablet topically daily. 5 vitamins and minerals   FOLIC ACID PO Take by mouth daily.   Iodine, Kelp, (KELP PO) Take 325 mg by mouth daily.   Krill Oil 1000 MG CAPS Take by mouth daily. Krill Oil   loratadine (CLARITIN) 10 MG tablet Take 10 mg by mouth daily.   Magnesium 500 MG CAPS Take by mouth.   OVER THE COUNTER MEDICATION 1 tablet daily. Annatrol bone and heart support   OVER THE COUNTER MEDICATION Take 2 tablets by mouth daily. Liposomal - D3 - K2 125 mg - 100mg    OVER THE COUNTER MEDICATION Take 2 tablets by mouth  daily. Osteo - Organic  Cal plus 5000iu-03, magnesium 400 mg   OVER THE COUNTER MEDICATION Take 2 tablets by mouth daily. Bone strength, calcium 60 mg - D3 50 mcg - K2 - mag 380 mg   Probiotic Product (PROBIOTIC & ACIDOPHILUS EX ST) CAPS Take 1 tablet by mouth 1 day or 1 dose.   triamcinolone cream (KENALOG) 0.1 % Apply 1 Application topically as needed.   Turmeric (QC TUMERIC COMPLEX PO) Take 1 tablet by mouth daily. With washabi extract   vitamin B-12 (CYANOCOBALAMIN) 1000 MCG tablet Take 1 tablet by mouth 1 day or 1 dose.   Zinc Acetate, Oral, (ZINC ACETATE PO) Take 100 mg by mouth 2 (two) times daily.   No facility-administered encounter medications on file as of 06/25/2023.    Surgical History: Past Surgical History:  Procedure Laterality Date   COLONOSCOPY     HERNIA REPAIR     INCISIONAL HERNIA REPAIR N/A 09/05/2017   Procedure: HERNIA REPAIR INCISIONAL;  Surgeon: Ancil Linsey, MD;  Location: [facility];  Service: General;  Laterality: N/A;   KNEE ARTHROSCOPY     she is not sure of which knee   LAPAROSCOPIC APPENDECTOMY N/A 10/19/2016   Procedure: APPENDECTOMY LAPAROSCOPIC;  Surgeon: Tiney Rouge III, MD;  Location: [facility];  Service: General;  Laterality: N/A;   MASS EXCISION Right 03/11/2023   Procedure: EXCISION OF BENIGN NEOPLASM;  Surgeon: Christena Flake, MD;  Location: [facility];  Service: Orthopedics;  Laterality: Right;   MEDIAL PARTIAL KNEE REPLACEMENT Bilateral    REVERSE SHOULDER ARTHROPLASTY Right 03/11/2023   Procedure: REVERSE SHOULDER ARTHROPLASTY;  Surgeon: Christena Flake, MD;  Location: [facility];  Service: Orthopedics;  Laterality: Right;   THYROID SURGERY      Medical History: Past Medical History:  Diagnosis Date   Acute appendicitis 03/11/2016   Allergy    Allergy-induced asthma    Anxiety    Arthritis    Depression    Frequent headaches    History of kidney stones    Hypercalcemia 03/18/2019   Hyperlipidemia    Irritable bowel syndrome (IBS)     Neuropathy    PONV (postoperative nausea and vomiting)    Thyroid disease     Family History: Family History  Problem Relation Age of Onset   Arthritis Mother    Diabetes Mother    Heart disease Father    Breast cancer Sister    Diabetes Maternal Uncle     Social History   Socioeconomic History   Marital status: Married    Spouse name: Iantha Fallen   Number of children: Not on file   Years of education: Not on file   Highest education level: Not on file  Occupational History   Not on file  Tobacco Use   Smoking status: Never   Smokeless tobacco: Never  Vaping Use   Vaping status: Never Used  Substance and Sexual Activity   Alcohol use: No    Alcohol/week: 0.0 standard drinks of alcohol   Drug use: No   Sexual activity: Never  Other Topics Concern   Not on file  Social History Narrative   Not on file   Social Determinants of Health   Financial Resource Strain: Low Risk  (01/30/2022)   Overall Financial Resource Strain (CARDIA)    Difficulty of Paying Living Expenses: Not hard at all  Food Insecurity: No Food Insecurity (01/30/2022)   Hunger Vital Sign    Worried About Running Out of Food in the Last Year: Never true    Ran Out of Food in the Last Year: Never true  Transportation Needs: No Transportation Needs (01/30/2022)   PRAPARE - Administrator, Civil Service (Medical): No    Lack of Transportation (Non-Medical): No  Physical Activity: Insufficiently Active (01/30/2022)   Exercise Vital Sign    Days of Exercise per Week: 3 days    Minutes of Exercise per Session: 20 min  Stress: No Stress Concern Present (01/30/2022)   [employer] of Occupational Health - Occupational Stress Questionnaire    Feeling of Stress : Only a little  Social Connections: Socially Integrated (01/30/2022)   Social Connection and Isolation Panel [NHANES]    Frequency of Communication with Friends and Family: Twice a week    Frequency of Social Gatherings with Friends and  Family: Once a week    Attends Religious Services: More than 4 times per year    Active Member of [employer] or Organizations: Yes    Attends Banker Meetings: More than 4 times per year    Marital Status: Married  Catering manager Violence: Not At Risk (01/30/2022)   Humiliation, Afraid, Rape, and Kick questionnaire    Fear of Current or Ex-Partner: No    Emotionally Abused: No    Physically Abused: No    Sexually Abused: No      Review of Systems  Constitutional:  Positive for appetite change and fatigue.  HENT: Negative.    Respiratory: Negative.  Negative for cough, chest tightness, shortness of breath and wheezing.   Cardiovascular: Negative.  Negative for chest pain and palpitations.  Gastrointestinal:  Positive for constipation, diarrhea and nausea. Negative for abdominal distention and abdominal pain.  Musculoskeletal:  Positive for arthralgias.  Neurological:  Positive for dizziness and weakness.  Psychiatric/Behavioral: Negative.  Negative for behavioral problems, self-injury, sleep disturbance and suicidal ideas. The patient is not nervous/anxious.     Vital Signs: BP 136/80   Pulse 94   Temp (!) 97.4 F (36.3 C)   Resp 16   Ht 5' (1.524 m)   SpO2 96%   BMI 33.90 kg/m    Physical Exam Vitals reviewed.  Constitutional:      General: She is not in acute distress.    Appearance: Normal appearance. She is not ill-appearing.  HENT:     Head: Normocephalic and atraumatic.  Eyes:     Pupils: Pupils are equal, round, and reactive to light.  Cardiovascular:     Rate and Rhythm: Normal rate and regular rhythm.  Pulmonary:     Effort: Pulmonary effort is normal. No respiratory distress.  Neurological:     Mental Status: She is alert and oriented to person, place, and time.  Psychiatric:        Mood and Affect: Mood normal.        Behavior: Behavior normal.        Assessment/Plan: 1. Hypervitaminosis D Currently symptomatic. Patient instructed to  stop all supplements that include vitamin D. We will repeat her vitamin D level in December.  2. Excessive daytime sleepiness Symptom of hypervitaminosis D  3. Primary hyperparathyroidism (HCC) Has produced high calcium levels in the past, high vitamin D can also increase calcium level   General Counseling: Danielle Nash verbalizes understanding of the findings of todays visit and agrees with plan of treatment. I have discussed any further diagnostic evaluation that may be needed or ordered today. We also reviewed her medications today. she has been encouraged to call the office with any questions or concerns that should arise related to todays visit.    No orders of the defined types were placed in this encounter.   No orders of the defined types were placed in this encounter.   Return for previously scheduled, F/U, Danielle Nash PCP in december.   Total time spent:30 Minutes Time spent includes review of chart, medications, test results, and follow up plan with the patient.   [place] Controlled Substance Database was reviewed by me.  This patient was seen by Sallyanne Kuster, FNP-C in collaboration with Dr. Beverely Risen as a part of collaborative care agreement.   Danielle Nash R. Tedd Sias, MSN, FNP-C Internal medicine

## 2023-06-26 ENCOUNTER — Encounter: Payer: Self-pay | Admitting: Nurse Practitioner

## 2023-06-27 DIAGNOSIS — G4734 Idiopathic sleep related nonobstructive alveolar hypoventilation: Secondary | ICD-10-CM | POA: Diagnosis not present

## 2023-07-18 DIAGNOSIS — L570 Actinic keratosis: Secondary | ICD-10-CM | POA: Diagnosis not present

## 2023-07-18 DIAGNOSIS — L853 Xerosis cutis: Secondary | ICD-10-CM | POA: Diagnosis not present

## 2023-07-18 DIAGNOSIS — Z85828 Personal history of other malignant neoplasm of skin: Secondary | ICD-10-CM | POA: Diagnosis not present

## 2023-07-18 DIAGNOSIS — L578 Other skin changes due to chronic exposure to nonionizing radiation: Secondary | ICD-10-CM | POA: Diagnosis not present

## 2023-07-18 DIAGNOSIS — L821 Other seborrheic keratosis: Secondary | ICD-10-CM | POA: Diagnosis not present

## 2023-07-21 DIAGNOSIS — M5430 Sciatica, unspecified side: Secondary | ICD-10-CM | POA: Diagnosis not present

## 2023-07-21 DIAGNOSIS — M549 Dorsalgia, unspecified: Secondary | ICD-10-CM | POA: Diagnosis not present

## 2023-07-28 DIAGNOSIS — G4734 Idiopathic sleep related nonobstructive alveolar hypoventilation: Secondary | ICD-10-CM | POA: Diagnosis not present

## 2023-07-30 DIAGNOSIS — M531 Cervicobrachial syndrome: Secondary | ICD-10-CM | POA: Diagnosis not present

## 2023-07-30 DIAGNOSIS — M9901 Segmental and somatic dysfunction of cervical region: Secondary | ICD-10-CM | POA: Diagnosis not present

## 2023-07-30 DIAGNOSIS — M546 Pain in thoracic spine: Secondary | ICD-10-CM | POA: Diagnosis not present

## 2023-07-30 DIAGNOSIS — M9903 Segmental and somatic dysfunction of lumbar region: Secondary | ICD-10-CM | POA: Diagnosis not present

## 2023-07-30 DIAGNOSIS — M5136 Other intervertebral disc degeneration, lumbar region: Secondary | ICD-10-CM | POA: Diagnosis not present

## 2023-07-30 DIAGNOSIS — M9902 Segmental and somatic dysfunction of thoracic region: Secondary | ICD-10-CM | POA: Diagnosis not present

## 2023-08-05 ENCOUNTER — Ambulatory Visit: Payer: Medicare Other | Admitting: Orthopedic Surgery

## 2023-08-07 DIAGNOSIS — M9902 Segmental and somatic dysfunction of thoracic region: Secondary | ICD-10-CM | POA: Diagnosis not present

## 2023-08-07 DIAGNOSIS — M5136 Other intervertebral disc degeneration, lumbar region: Secondary | ICD-10-CM | POA: Diagnosis not present

## 2023-08-07 DIAGNOSIS — M9903 Segmental and somatic dysfunction of lumbar region: Secondary | ICD-10-CM | POA: Diagnosis not present

## 2023-08-07 DIAGNOSIS — M531 Cervicobrachial syndrome: Secondary | ICD-10-CM | POA: Diagnosis not present

## 2023-08-07 DIAGNOSIS — M9901 Segmental and somatic dysfunction of cervical region: Secondary | ICD-10-CM | POA: Diagnosis not present

## 2023-08-07 DIAGNOSIS — M546 Pain in thoracic spine: Secondary | ICD-10-CM | POA: Diagnosis not present

## 2023-08-13 DIAGNOSIS — M5136 Other intervertebral disc degeneration, lumbar region: Secondary | ICD-10-CM | POA: Diagnosis not present

## 2023-08-13 DIAGNOSIS — M9901 Segmental and somatic dysfunction of cervical region: Secondary | ICD-10-CM | POA: Diagnosis not present

## 2023-08-13 DIAGNOSIS — M9903 Segmental and somatic dysfunction of lumbar region: Secondary | ICD-10-CM | POA: Diagnosis not present

## 2023-08-13 DIAGNOSIS — M9902 Segmental and somatic dysfunction of thoracic region: Secondary | ICD-10-CM | POA: Diagnosis not present

## 2023-08-13 DIAGNOSIS — M546 Pain in thoracic spine: Secondary | ICD-10-CM | POA: Diagnosis not present

## 2023-08-13 DIAGNOSIS — M531 Cervicobrachial syndrome: Secondary | ICD-10-CM | POA: Diagnosis not present

## 2023-08-21 ENCOUNTER — Ambulatory Visit (INDEPENDENT_AMBULATORY_CARE_PROVIDER_SITE_OTHER): Payer: Medicare Other | Admitting: Nurse Practitioner

## 2023-08-21 ENCOUNTER — Encounter: Payer: Self-pay | Admitting: Nurse Practitioner

## 2023-08-21 VITALS — BP 132/88 | HR 60 | Temp 96.9°F | Resp 16 | Ht 60.0 in | Wt 175.0 lb

## 2023-08-21 DIAGNOSIS — W57XXXA Bitten or stung by nonvenomous insect and other nonvenomous arthropods, initial encounter: Secondary | ICD-10-CM | POA: Diagnosis not present

## 2023-08-21 DIAGNOSIS — S30860A Insect bite (nonvenomous) of lower back and pelvis, initial encounter: Secondary | ICD-10-CM | POA: Diagnosis not present

## 2023-08-21 DIAGNOSIS — A6923 Arthritis due to Lyme disease: Secondary | ICD-10-CM

## 2023-08-21 MED ORDER — DOXYCYCLINE HYCLATE 100 MG PO TABS: 100.0000 mg | ORAL_TABLET | Freq: Two times a day (BID) | ORAL | 0 refills | Status: AC

## 2023-08-21 NOTE — Progress Notes (Signed)
[facility] [address] [place], [place] [address]  Internal MEDICINE  Office Visit Note  Patient Name: Danielle Nash  [id number]  [id number]  Date of Service: 08/21/2023  Chief Complaint  Patient presents with   Acute Visit    Bump on lower left side and red. Hurting everything.     HPI Danielle Nash presents for an acute sick visit for tick bite with poss symptoms of lyme disease.  Found the tick bite on Monday this week, but no tick.  It is located on her left lower back with the bulls eye rash. Several weeks ago she also had a possible tick bite with the bulls eye rash and rash on her left abdomen and left flank that may have been erythema migrans.  Her current symptoms include increasingly more severe arthritic pain in her joints esp her knees, fatigue, myalgia, headache, stiff neck, cognitive slowing/MCI, dizziness, and gait disturbance     Current Medication:  Outpatient Encounter Medications as of 08/21/2023  Medication Sig   Ascorbic Acid (VITAMIN C PO) Take 1,000 mg by mouth daily.   B Complex Vitamins (VITAMIN-B COMPLEX) TABS Take 1 tablet by mouth 1 day or 1 dose.   Biotin 5000 MCG TABS Take by mouth daily.   Cholecalciferol (VITAMIN D) 2000 units tablet Take 5,000 Units by mouth 1 day or 1 dose.   dicyclomine (BENTYL) 10 MG capsule Take 1 capsule (10 mg total) by mouth 4 (four) times daily -  before meals and at bedtime.   doxycycline (VIBRA-TABS) 100 MG tablet Take 1 tablet (100 mg total) by mouth 2 (two) times daily for 28 days. For tx for lyme   Emollient (COLLAGEN EX) Apply 1 tablet topically daily. 5 vitamins and minerals   FOLIC ACID PO Take by mouth daily.   Iodine, Kelp, (KELP PO) Take 325 mg by mouth daily.   Krill Oil 1000 MG CAPS Take by mouth daily. Krill Oil   loratadine (CLARITIN) 10 MG tablet Take 10 mg by mouth daily.   Magnesium 500 MG CAPS Take by mouth.   OVER THE COUNTER MEDICATION 1 tablet daily. Annatrol bone and heart support   OVER  THE COUNTER MEDICATION Take 2 tablets by mouth daily. Liposomal - D3 - K2 125 mg - 100mg    OVER THE COUNTER MEDICATION Take 2 tablets by mouth daily. Osteo - Organic  Cal plus 5000iu-03, magnesium 400 mg   OVER THE COUNTER MEDICATION Take 2 tablets by mouth daily. Bone strength, calcium 60 mg - D3 50 mcg - K2 - mag 380 mg   Probiotic Product (PROBIOTIC & ACIDOPHILUS EX ST) CAPS Take 1 tablet by mouth 1 day or 1 dose.   triamcinolone cream (KENALOG) 0.1 % Apply 1 Application topically as needed.   Turmeric (QC TUMERIC COMPLEX PO) Take 1 tablet by mouth daily. With washabi extract   vitamin B-12 (CYANOCOBALAMIN) 1000 MCG tablet Take 1 tablet by mouth 1 day or 1 dose.   Zinc Acetate, Oral, (ZINC ACETATE PO) Take 100 mg by mouth 2 (two) times daily.   No facility-administered encounter medications on file as of 08/21/2023.      Medical History: Past Medical History:  Diagnosis Date   Acute appendicitis 03/11/2016   Allergy    Allergy-induced asthma    Anxiety    Arthritis    Depression    Frequent headaches    History of kidney stones    Hypercalcemia 03/18/2019   Hyperlipidemia    Irritable bowel syndrome (  IBS)    Neuropathy    PONV (postoperative nausea and vomiting)    Thyroid disease      Vital Signs: BP 132/88   Pulse 60   Temp (!) 96.9 F (36.1 C)   Resp 16   Ht 5' (1.524 m)   Wt 175 lb (79.4 kg)   SpO2 98%   BMI 34.18 kg/m    Review of Systems  Constitutional:  Positive for appetite change and fatigue.  Respiratory: Negative.  Negative for cough, chest tightness, shortness of breath and wheezing.   Cardiovascular: Negative.  Negative for chest pain and palpitations.  Gastrointestinal: Negative.   Musculoskeletal:  Positive for arthralgias, back pain, gait problem, myalgias and neck stiffness.  Skin:  Positive for rash.  Neurological:  Positive for dizziness and headaches.  Psychiatric/Behavioral:  Positive for sleep disturbance. Negative for behavioral  problems. The patient is not nervous/anxious.     Physical Exam Vitals reviewed.  Constitutional:      General: She is not in acute distress.    Appearance: Normal appearance. She is ill-appearing.  HENT:     Head: Normocephalic and atraumatic.  Eyes:     Pupils: Pupils are equal, round, and reactive to light.  Cardiovascular:     Rate and Rhythm: Normal rate and regular rhythm.  Pulmonary:     Effort: Pulmonary effort is normal. No respiratory distress.  Musculoskeletal:     Cervical back: Rigidity present.  Skin:    Findings: Rash (bulls eye rash on left lower back) present.          Comments: Marked area is the bullseye rash  Neurological:     Mental Status: She is alert and oriented to person, place, and time.     Motor: Weakness present.     Gait: Gait abnormal.  Psychiatric:        Mood and Affect: Mood normal.        Behavior: Behavior normal.       Assessment/Plan: 1. 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  - doxycycline (VIBRA-TABS) 100 MG tablet; Take 1 tablet (100 mg total) by mouth 2 (two) times daily for 28 days. For tx for lyme  Dispense: 56 tablet; Refill: 0 - Lyme Disease Serology w/Reflex  2. Tick bite of lower back, initial encounter Treatment and labs ordered. - doxycycline (VIBRA-TABS) 100 MG tablet; Take 1 tablet (100 mg total) by mouth 2 (two) times daily for 28 days. For tx for lyme  Dispense: 56 tablet; Refill: 0 - Lyme Disease Serology w/Reflex   General Counseling: [name] verbalizes understanding of the findings of todays visit and agrees with plan of treatment. I have discussed any further diagnostic evaluation that may be needed or ordered today. We also reviewed her medications today. she has been encouraged to call the office with any questions or concerns that should arise related to todays visit.    Counseling:    Orders Placed This Encounter  Procedures    Lyme Disease Serology w/Reflex    Meds ordered this encounter  Medications   doxycycline (VIBRA-TABS) 100 MG tablet    Sig: Take 1 tablet (100 mg total) by mouth 2 (two) times daily for 28 days. For tx for lyme    Dispense:  56 tablet    Refill:  0    Return if symptoms worsen or fail to improve.  [place] Controlled Substance Database was reviewed by me for overdose  risk score (ORS)  Time spent:20 Minutes Time spent with patient included reviewing progress notes, labs, imaging studies, and discussing plan for follow up.   This patient was seen by Sallyanne Kuster, FNP-C in collaboration with Dr. Beverely Risen as a part of collaborative care agreement.  Demetris Capell R. Tedd Sias, MSN, FNP-C Internal Medicine

## 2023-08-22 ENCOUNTER — Encounter: Payer: Self-pay | Admitting: Nurse Practitioner

## 2023-08-23 LAB — LYME DISEASE SEROLOGY W/REFLEX: Lyme Total Antibody EIA: NEGATIVE

## 2023-08-28 DIAGNOSIS — G4734 Idiopathic sleep related nonobstructive alveolar hypoventilation: Secondary | ICD-10-CM | POA: Diagnosis not present

## 2023-08-29 ENCOUNTER — Telehealth: Payer: Self-pay

## 2023-08-29 DIAGNOSIS — S30860A Insect bite (nonvenomous) of lower back and pelvis, initial encounter: Secondary | ICD-10-CM

## 2023-08-29 DIAGNOSIS — A6923 Arthritis due to Lyme disease: Secondary | ICD-10-CM

## 2023-09-01 DIAGNOSIS — Z96611 Presence of right artificial shoulder joint: Secondary | ICD-10-CM | POA: Diagnosis not present

## 2023-09-03 NOTE — Telephone Encounter (Signed)
Pt advised labs ordered and copy of labs order  ready for pickup

## 2023-09-05 DIAGNOSIS — W57XXXA Bitten or stung by nonvenomous insect and other nonvenomous arthropods, initial encounter: Secondary | ICD-10-CM | POA: Diagnosis not present

## 2023-09-05 DIAGNOSIS — S30860A Insect bite (nonvenomous) of lower back and pelvis, initial encounter: Secondary | ICD-10-CM | POA: Diagnosis not present

## 2023-09-05 DIAGNOSIS — A6923 Arthritis due to Lyme disease: Secondary | ICD-10-CM | POA: Diagnosis not present

## 2023-09-11 DIAGNOSIS — M545 Low back pain, unspecified: Secondary | ICD-10-CM | POA: Diagnosis not present

## 2023-09-11 DIAGNOSIS — M546 Pain in thoracic spine: Secondary | ICD-10-CM | POA: Diagnosis not present

## 2023-09-11 DIAGNOSIS — M9903 Segmental and somatic dysfunction of lumbar region: Secondary | ICD-10-CM | POA: Diagnosis not present

## 2023-09-11 DIAGNOSIS — M9901 Segmental and somatic dysfunction of cervical region: Secondary | ICD-10-CM | POA: Diagnosis not present

## 2023-09-11 DIAGNOSIS — M531 Cervicobrachial syndrome: Secondary | ICD-10-CM | POA: Diagnosis not present

## 2023-09-11 DIAGNOSIS — M9902 Segmental and somatic dysfunction of thoracic region: Secondary | ICD-10-CM | POA: Diagnosis not present

## 2023-09-11 LAB — SPOTTED FEVER GROUP ANTIBODIES
Spotted Fever Group IgG: 1:64
Spotted Fever Group IgM: <1:64 {titer}

## 2023-09-11 LAB — TICKBORNE DISEASE ANTIBODY PROFILE, SERUM
Babesia microti IgG: <1:10 {titer}
E.Chaffeensis (HME) IgG: NEGATIVE
HGE IgG Titer: NEGATIVE
Lyme Total Antibody EIA: NEGATIVE

## 2023-09-17 ENCOUNTER — Telehealth: Payer: Self-pay

## 2023-09-19 NOTE — Telephone Encounter (Signed)
Pt advised all labs negative

## 2023-09-27 DIAGNOSIS — G4734 Idiopathic sleep related nonobstructive alveolar hypoventilation: Secondary | ICD-10-CM | POA: Diagnosis not present

## 2023-10-14 DIAGNOSIS — M546 Pain in thoracic spine: Secondary | ICD-10-CM | POA: Diagnosis not present

## 2023-10-14 DIAGNOSIS — M531 Cervicobrachial syndrome: Secondary | ICD-10-CM | POA: Diagnosis not present

## 2023-10-14 DIAGNOSIS — M545 Low back pain, unspecified: Secondary | ICD-10-CM | POA: Diagnosis not present

## 2023-10-14 DIAGNOSIS — M9901 Segmental and somatic dysfunction of cervical region: Secondary | ICD-10-CM | POA: Diagnosis not present

## 2023-10-14 DIAGNOSIS — M9902 Segmental and somatic dysfunction of thoracic region: Secondary | ICD-10-CM | POA: Diagnosis not present

## 2023-10-14 DIAGNOSIS — M9903 Segmental and somatic dysfunction of lumbar region: Secondary | ICD-10-CM | POA: Diagnosis not present

## 2023-10-28 DIAGNOSIS — G4734 Idiopathic sleep related nonobstructive alveolar hypoventilation: Secondary | ICD-10-CM | POA: Diagnosis not present

## 2023-11-07 DIAGNOSIS — H40003 Preglaucoma, unspecified, bilateral: Secondary | ICD-10-CM | POA: Diagnosis not present

## 2023-11-07 DIAGNOSIS — H2513 Age-related nuclear cataract, bilateral: Secondary | ICD-10-CM | POA: Diagnosis not present

## 2023-11-07 DIAGNOSIS — H43813 Vitreous degeneration, bilateral: Secondary | ICD-10-CM | POA: Diagnosis not present

## 2023-11-27 DIAGNOSIS — G4734 Idiopathic sleep related nonobstructive alveolar hypoventilation: Secondary | ICD-10-CM | POA: Diagnosis not present

## 2023-12-15 ENCOUNTER — Ambulatory Visit: Payer: Medicare Other | Admitting: Nurse Practitioner

## 2023-12-28 DIAGNOSIS — G4734 Idiopathic sleep related nonobstructive alveolar hypoventilation: Secondary | ICD-10-CM | POA: Diagnosis not present

## 2024-01-22 DIAGNOSIS — M13861 Other specified arthritis, right knee: Secondary | ICD-10-CM | POA: Diagnosis not present

## 2024-01-22 DIAGNOSIS — M79661 Pain in right lower leg: Secondary | ICD-10-CM | POA: Diagnosis not present

## 2024-01-28 DIAGNOSIS — G4734 Idiopathic sleep related nonobstructive alveolar hypoventilation: Secondary | ICD-10-CM | POA: Diagnosis not present

## 2024-02-09 DIAGNOSIS — M79604 Pain in right leg: Secondary | ICD-10-CM | POA: Diagnosis not present

## 2024-02-09 DIAGNOSIS — Z96659 Presence of unspecified artificial knee joint: Secondary | ICD-10-CM | POA: Diagnosis not present

## 2024-02-09 DIAGNOSIS — S8011XA Contusion of right lower leg, initial encounter: Secondary | ICD-10-CM | POA: Diagnosis not present

## 2024-02-25 DIAGNOSIS — G4734 Idiopathic sleep related nonobstructive alveolar hypoventilation: Secondary | ICD-10-CM | POA: Diagnosis not present

## 2024-03-09 DIAGNOSIS — Z1212 Encounter for screening for malignant neoplasm of rectum: Secondary | ICD-10-CM | POA: Diagnosis not present

## 2024-03-09 DIAGNOSIS — Z1211 Encounter for screening for malignant neoplasm of colon: Secondary | ICD-10-CM | POA: Diagnosis not present

## 2024-03-13 LAB — COLOGUARD: COLOGUARD: NEGATIVE

## 2024-03-13 LAB — EXTERNAL GENERIC LAB PROCEDURE: COLOGUARD: NEGATIVE

## 2024-03-14 ENCOUNTER — Other Ambulatory Visit: Payer: Self-pay

## 2024-03-14 LAB — COLOGUARD: Cologuard: NEGATIVE

## 2024-03-27 DIAGNOSIS — G4734 Idiopathic sleep related nonobstructive alveolar hypoventilation: Secondary | ICD-10-CM | POA: Diagnosis not present

## 2024-04-26 DIAGNOSIS — G4734 Idiopathic sleep related nonobstructive alveolar hypoventilation: Secondary | ICD-10-CM | POA: Diagnosis not present

## 2024-05-05 DIAGNOSIS — G5603 Carpal tunnel syndrome, bilateral upper limbs: Secondary | ICD-10-CM | POA: Diagnosis not present

## 2024-05-05 DIAGNOSIS — G3184 Mild cognitive impairment, so stated: Secondary | ICD-10-CM | POA: Diagnosis not present

## 2024-05-05 DIAGNOSIS — Z7189 Other specified counseling: Secondary | ICD-10-CM | POA: Diagnosis not present

## 2024-05-05 DIAGNOSIS — G629 Polyneuropathy, unspecified: Secondary | ICD-10-CM | POA: Diagnosis not present

## 2024-05-27 DIAGNOSIS — G4734 Idiopathic sleep related nonobstructive alveolar hypoventilation: Secondary | ICD-10-CM | POA: Diagnosis not present

## 2024-06-08 DIAGNOSIS — R202 Paresthesia of skin: Secondary | ICD-10-CM | POA: Diagnosis not present

## 2024-06-08 DIAGNOSIS — R200 Anesthesia of skin: Secondary | ICD-10-CM | POA: Diagnosis not present

## 2024-06-14 ENCOUNTER — Encounter: Payer: Self-pay | Admitting: Nurse Practitioner

## 2024-06-14 ENCOUNTER — Ambulatory Visit: Payer: Medicare Other | Admitting: Nurse Practitioner

## 2024-06-14 VITALS — BP 136/88 | HR 66 | Temp 97.7°F | Resp 16 | Ht 60.0 in | Wt 177.4 lb

## 2024-06-14 DIAGNOSIS — Z9889 Other specified postprocedural states: Secondary | ICD-10-CM | POA: Diagnosis not present

## 2024-06-14 DIAGNOSIS — I700 Atherosclerosis of aorta: Secondary | ICD-10-CM | POA: Diagnosis not present

## 2024-06-14 DIAGNOSIS — F411 Generalized anxiety disorder: Secondary | ICD-10-CM

## 2024-06-14 DIAGNOSIS — Z9089 Acquired absence of other organs: Secondary | ICD-10-CM | POA: Diagnosis not present

## 2024-06-14 DIAGNOSIS — R002 Palpitations: Secondary | ICD-10-CM

## 2024-06-14 DIAGNOSIS — E673 Hypervitaminosis D: Secondary | ICD-10-CM

## 2024-06-14 DIAGNOSIS — A6923 Arthritis due to Lyme disease: Secondary | ICD-10-CM | POA: Diagnosis not present

## 2024-06-14 DIAGNOSIS — E538 Deficiency of other specified B group vitamins: Secondary | ICD-10-CM

## 2024-06-14 DIAGNOSIS — E210 Primary hyperparathyroidism: Secondary | ICD-10-CM

## 2024-06-14 DIAGNOSIS — R0609 Other forms of dyspnea: Secondary | ICD-10-CM

## 2024-06-14 DIAGNOSIS — Z0000 Encounter for general adult medical examination without abnormal findings: Secondary | ICD-10-CM

## 2024-06-14 DIAGNOSIS — G4719 Other hypersomnia: Secondary | ICD-10-CM | POA: Diagnosis not present

## 2024-06-14 NOTE — Progress Notes (Signed)
 [facility] [address] [place], [place] [address]  Internal MEDICINE  Office Visit Note  Patient Name: Danielle Nash  [id number]  [id number]  Date of Service: 06/14/2024  Chief Complaint  Patient presents with   Depression   Hyperlipidemia   Medicare Wellness    HPI Sunday presents for a medicare annual wellness visit.  Well-appearing 73 y.o. female with migraines, allergic rhinitis, IBS, hypothyroidism, hyperparathyroidism,low back pain, insomnia and excessive daytime sleepiness and hypervitaminosis D.  Routine CRC screening: cologuard negative on 330/25, repeat in 3 yeahrs  Routine mammogram: declined  DEXA scan: done in 2023 Pap smear: discontinued due to age  Labs: due for routine labs  New or worsening pain: chronic neuropathy in legs, seeing neurology.  Other concerns: throbbing headaches on left side of head.  Patient was informed last year to stop taking vitamin D  supplements because her level is elevated and elevated vitamin D  can cause serious complications. Patient reports today that she did not stop taking the vitamin D  supplements and continues to take them and does not remember discussing those possible complications.      06/14/2024    2:47 PM 06/11/2023    2:53 PM 01/25/2021    2:01 PM  MMSE - Mini Mental State Exam  Orientation to time 5 5 5   Orientation to Place 5 5 5   Registration 3 3 3   Attention/ Calculation 5 5 5   Recall 3 3 3   Language- name 2 objects 2 2 2   Language- repeat 1 1 1   Language- follow 3 step command 3 3 3   Language- read & follow direction 1 1 1   Write a sentence 1 1 1   Copy design 1 1 1   Total score 30 30 30     Functional Status Survey: Is the patient deaf or have difficulty hearing?: Yes Does the patient have difficulty seeing, even when wearing glasses/contacts?: Yes Does the patient have difficulty concentrating, remembering, or making decisions?: Yes Does the patient have difficulty walking or climbing stairs?:  Yes Does the patient have difficulty dressing or bathing?: No Does the patient have difficulty doing errands alone such as visiting a doctor's office or shopping?: No     01/30/2022    8:28 AM 04/25/2022    8:37 AM 10/02/2022   10:45 AM 06/11/2023    2:51 PM 06/14/2024    2:46 PM  Fall Risk  Falls in the past year? 0 0 0 0 1  Was there an injury with Fall? 0  0 0 0  Fall Risk Category Calculator 0  0 0 1  Fall Risk Category (Retired) Low   Low     (RETIRED) Patient Fall Risk Level Low fall risk  Low fall risk  Low fall risk     Patient at Risk for Falls Due to  No Fall Risks No Fall Risks No Fall Risks   Fall risk Follow up Falls evaluation completed;Falls prevention discussed  Falls evaluation completed  Falls evaluation completed  Falls evaluation completed Falls evaluation completed     Data saved with a previous flowsheet row definition       06/14/2024    3:10 PM  Depression screen PHQ 2/9  Decreased Interest 0  Down, Depressed, Hopeless 1  PHQ - 2 Score 1       01/23/2022    9:32 AM 10/19/2021   11:01 AM  GAD 7 : Generalized Anxiety Score  Nervous, Anxious, on Edge 0 1  Control/stop worrying 0 0  Worry too much - different things 0 1  Trouble relaxing 2 2  Restless 0 0  Easily annoyed or irritable 1 2  Afraid - awful might happen 0 0  Total GAD 7 Score 3 6  Anxiety Difficulty  Not difficult at all      Current Medication: Outpatient Encounter Medications as of 06/14/2024  Medication Sig   Ascorbic Acid (VITAMIN C PO) Take 1,000 mg by mouth daily.   B Complex Vitamins (VITAMIN-B COMPLEX) TABS Take 1 tablet by mouth 1 day or 1 dose.   Biotin 5000 MCG TABS Take by mouth daily.   Cholecalciferol (VITAMIN D ) 2000 units tablet Take 5,000 Units by mouth 1 day or 1 dose.   Emollient (COLLAGEN EX) Apply 1 tablet topically daily. 5 vitamins and minerals   FOLIC ACID  PO Take by mouth daily.   Iodine, Kelp, (KELP PO) Take 325 mg by mouth daily.   Krill Oil 1000 MG CAPS  Take by mouth daily. Krill Oil   loratadine (CLARITIN) 10 MG tablet Take 10 mg by mouth daily.   Magnesium 500 MG CAPS Take by mouth.   OVER THE COUNTER MEDICATION 1 tablet daily. Annatrol bone and heart support   OVER THE COUNTER MEDICATION Take 2 tablets by mouth daily. Liposomal - D3 - K2 125 mg - 100mg    OVER THE COUNTER MEDICATION Take 2 tablets by mouth daily. Osteo - Organic  Cal plus 5000iu-03, magnesium 400 mg   OVER THE COUNTER MEDICATION Take 2 tablets by mouth daily. Bone strength, calcium 60 mg - D3 50 mcg - K2 - mag 380 mg   Probiotic Product (PROBIOTIC & ACIDOPHILUS EX ST) CAPS Take 1 tablet by mouth 1 day or 1 dose.   triamcinolone  cream (KENALOG ) 0.1 % Apply 1 Application topically as needed.   Turmeric (QC TUMERIC COMPLEX PO) Take 1 tablet by mouth daily. With washabi extract   vitamin B-12 (CYANOCOBALAMIN ) 1000 MCG tablet Take 1 tablet by mouth 1 day or 1 dose.   Zinc Acetate, Oral, (ZINC ACETATE PO) Take 100 mg by mouth 2 (two) times daily.   [DISCONTINUED] dicyclomine  (BENTYL ) 10 MG capsule Take 1 capsule (10 mg total) by mouth 4 (four) times daily -  before meals and at bedtime.   No facility-administered encounter medications on file as of 06/14/2024.    Surgical History: Past Surgical History:  Procedure Laterality Date   COLONOSCOPY     HERNIA REPAIR     INCISIONAL HERNIA REPAIR N/A 09/05/2017   Procedure: HERNIA REPAIR INCISIONAL;  Surgeon: Nicholaus Selinda Birmingham, MD;  Location: [facility] ORS;  Service: General;  Laterality: N/A;   KNEE ARTHROSCOPY     she is not sure of which knee   LAPAROSCOPIC APPENDECTOMY N/A 10/19/2016   Procedure: APPENDECTOMY LAPAROSCOPIC;  Surgeon: Elgin Laurence III, MD;  Location: [facility];  Service: General;  Laterality: N/A;   MASS EXCISION Right 03/11/2023   Procedure: EXCISION OF BENIGN NEOPLASM;  Surgeon: Edie Norleen PARAS, MD;  Location: [facility] OR[facility];  Service: Orthopedics;  Laterality: Right;   MEDIAL PARTIAL KNEE REPLACEMENT Bilateral    REVERSE  SHOULDER ARTHROPLASTY Right 03/11/2023   Procedure: REVERSE SHOULDER ARTHROPLASTY;  Surgeon: Edie Norleen PARAS, MD;  Location: [facility] OR[facility];  Service: Orthopedics;  Laterality: Right;   THYROID  SURGERY      Medical History: Past Medical History:  Diagnosis Date   Acute appendicitis 03/11/2016   Allergy    Allergy-induced asthma    Anxiety    Arthritis  Depression    Frequent headaches    History of kidney stones    Hypercalcemia 03/18/2019   Hyperlipidemia    Irritable bowel syndrome (IBS)    Neuropathy    PONV (postoperative nausea and vomiting)    Thyroid  disease     Family History: Family History  Problem Relation Age of Onset   Arthritis Mother    Diabetes Mother    Heart disease Father    Breast cancer Sister    Diabetes Maternal Uncle     Social History   Socioeconomic History   Marital status: Married    Spouse name: Vinie   Number of children: Not on file   Years of education: Not on file   Highest education level: Not on file  Occupational History   Not on file  Tobacco Use   Smoking status: Never   Smokeless tobacco: Never  Vaping Use   Vaping status: Never Used  Substance and Sexual Activity   Alcohol use: No    Alcohol/week: 0.0 standard drinks of alcohol   Drug use: No   Sexual activity: Never  Other Topics Concern   Not on file  Social History Narrative   Not on file   Social Drivers of Health   Financial Resource Strain: Low Risk  (06/08/2024)   Received from [facility]   Overall Financial Resource Strain (CARDIA)    Difficulty of Paying Living Expenses: Not very hard  Food Insecurity: No Food Insecurity (06/08/2024)   Received from [facility]   Hunger Vital Sign    Within the past 12 months, you worried that your food would run out before you got the money to buy more.: Never true    Within the past 12 months, the food you bought just didn't last and you didn't have money to get more.: Never true   Transportation Needs: No Transportation Needs (06/08/2024)   Received from [facility] - Transportation    In the past 12 months, has lack of transportation kept you from medical appointments or from getting medications?: No    Lack of Transportation (Non-Medical): No  Physical Activity: Insufficiently Active (01/30/2022)   Exercise Vital Sign    Days of Exercise per Week: 3 days    Minutes of Exercise per Session: 20 min  Stress: No Stress Concern Present (01/30/2022)   [employer] of Occupational Health - Occupational Stress Questionnaire    Feeling of Stress : Only a little  Social Connections: Socially Integrated (01/30/2022)   Social Connection and Isolation Panel    Frequency of Communication with Friends and Family: Twice a week    Frequency of Social Gatherings with Friends and Family: Once a week    Attends Religious Services: More than 4 times per year    Active Member of [employer] or Organizations: Yes    Attends Engineer, structural: More than 4 times per year    Marital Status: Married  Catering manager Violence: Not At Risk (01/30/2022)   Humiliation, Afraid, Rape, and Kick questionnaire    Fear of Current or Ex-Partner: No    Emotionally Abused: No    Physically Abused: No    Sexually Abused: No      Review of Systems  Constitutional:  Positive for appetite change and fatigue. Negative for activity change, chills, fever and unexpected weight change.  HENT: Negative.  Negative for congestion, ear pain, rhinorrhea, sore throat and trouble swallowing.   Eyes:  Negative.   Respiratory: Negative.  Negative for cough, chest tightness, shortness of breath and wheezing.   Cardiovascular: Negative.  Negative for chest pain and palpitations.  Gastrointestinal:  Positive for constipation, diarrhea and nausea. Negative for abdominal distention, abdominal pain, blood in stool and vomiting.  Endocrine: Negative.   Genitourinary: Negative.   Negative for difficulty urinating, dysuria, frequency, hematuria and urgency.  Musculoskeletal:  Positive for arthralgias. Negative for back pain, joint swelling, myalgias and neck pain.  Skin: Negative.  Negative for rash and wound.  Allergic/Immunologic: Negative.  Negative for immunocompromised state.  Neurological:  Positive for dizziness and weakness. Negative for seizures, numbness and headaches.  Hematological: Negative.   Psychiatric/Behavioral: Negative.  Negative for behavioral problems, self-injury, sleep disturbance and suicidal ideas. The patient is not nervous/anxious.     Vital Signs: BP 136/88   Pulse 66   Temp 97.7 F (36.5 C)   Resp 16   Ht 5' (1.524 m)   Wt 177 lb 6.4 oz (80.5 kg)   SpO2 97%   BMI 34.65 kg/m    Physical Exam Vitals reviewed.  Constitutional:      General: She is not in acute distress.    Appearance: Normal appearance. She is well-developed. She is obese. She is not ill-appearing or diaphoretic.  HENT:     Head: Normocephalic and atraumatic.     Right Ear: Tympanic membrane, ear canal and external ear normal.     Left Ear: Tympanic membrane, ear canal and external ear normal.     Nose: Nose normal. No congestion or rhinorrhea.     Mouth/Throat:     Mouth: Mucous membranes are moist.     Pharynx: Oropharynx is clear. No oropharyngeal exudate or posterior oropharyngeal erythema.  Eyes:     General: No scleral icterus.       Right eye: No discharge.        Left eye: No discharge.     Extraocular Movements: Extraocular movements intact.     Conjunctiva/sclera: Conjunctivae normal.     Pupils: Pupils are equal, round, and reactive to light.  Neck:     Thyroid : No thyromegaly.     Vascular: No JVD.     Trachea: No tracheal deviation.  Cardiovascular:     Rate and Rhythm: Normal rate and regular rhythm.     Pulses: Normal pulses.     Heart sounds: Normal heart sounds. No murmur heard.    No friction rub. No gallop.  Pulmonary:      Effort: Pulmonary effort is normal. No respiratory distress.     Breath sounds: Normal breath sounds. No stridor. No wheezing or rales.  Chest:     Chest wall: No tenderness.     Comments: Declined clinical breast exam and mammogram.  Abdominal:     General: Bowel sounds are normal. There is no distension.     Palpations: Abdomen is soft. There is no mass.     Tenderness: There is no abdominal tenderness. There is no guarding or rebound.  Musculoskeletal:        General: No tenderness or deformity. Normal range of motion.     Cervical back: Normal range of motion and neck supple.     Right lower leg: No edema.     Left lower leg: No edema.  Lymphadenopathy:     Cervical: No cervical adenopathy.  Skin:    General: Skin is warm and dry.     Capillary Refill: Capillary refill takes less than 2  seconds.     Coloration: Skin is not pale.     Findings: No erythema or rash.  Neurological:     Mental Status: She is alert and oriented to person, place, and time.     Cranial Nerves: No cranial nerve deficit.     Motor: No abnormal muscle tone.     Coordination: Coordination normal.     Gait: Gait normal.     Deep Tendon Reflexes: Reflexes are normal and symmetric.  Psychiatric:        Mood and Affect: Mood normal.        Behavior: Behavior normal.        Thought Content: Thought content normal.        Judgment: Judgment normal.        Assessment/Plan: 1. Encounter for subsequent annual wellness visit (AWV) in Medicare patient (Primary) Age-appropriate preventive screenings and vaccinations discussed. Routine labs for health maintenance will be ordered.  PHM updated.    2. Hypervitaminosis D Counseled patient on risks of high vitamin D  levels, encouraged her to stop taking supplements. Will recheck levels to see where it is at as well.   3. Excessive daytime sleepiness As a symptoms of high vitamin D  levels   4. Palpitations Echocardiogram ordered  - ECHOCARDIOGRAM COMPLETE;  Future  5. Dyspnea on exertion Echocardiogram ordered  - ECHOCARDIOGRAM COMPLETE; Future  6. Aortic atherosclerosis (HCC) Noted. Not on any statin therapy. She is taking krill oil supplement  7. B12 deficiency Takes OTC B12 supplements   9. GAD (generalized anxiety disorder) Not currently taking any medications, anxiety is manageable.   10. History of parathyroidectomy Noted, monitoring calcium and vitamin D  levels.      General Counseling: aeryn medici understanding of the findings of todays visit and agrees with plan of treatment. I have discussed any further diagnostic evaluation that may be needed or ordered today. We also reviewed her medications today. she has been encouraged to call the office with any questions or concerns that should arise related to todays visit.    Orders Placed This Encounter  Procedures   ECHOCARDIOGRAM COMPLETE    No orders of the defined types were placed in this encounter.   Return in about 5 weeks (around 07/21/2024) for F/U, Review labs/test, Anan Dapolito PCP echo and bloodwork.   Total time spent:30 Minutes Time spent includes review of chart, medications, test results, and follow up plan with the patient.   [place] Controlled Substance Database was reviewed by me.  This patient was seen by Mardy Maxin, FNP-C in collaboration with Dr. Sigrid Bathe as a part of collaborative care agreement.  Naveh Rickles R. Maxin, MSN, FNP-C Internal medicine

## 2024-06-26 DIAGNOSIS — G4734 Idiopathic sleep related nonobstructive alveolar hypoventilation: Secondary | ICD-10-CM | POA: Diagnosis not present

## 2024-07-06 ENCOUNTER — Other Ambulatory Visit: Payer: Self-pay | Admitting: Nurse Practitioner

## 2024-07-06 DIAGNOSIS — E673 Hypervitaminosis D: Secondary | ICD-10-CM | POA: Diagnosis not present

## 2024-07-06 DIAGNOSIS — E210 Primary hyperparathyroidism: Secondary | ICD-10-CM | POA: Diagnosis not present

## 2024-07-06 DIAGNOSIS — E538 Deficiency of other specified B group vitamins: Secondary | ICD-10-CM | POA: Diagnosis not present

## 2024-07-06 DIAGNOSIS — I700 Atherosclerosis of aorta: Secondary | ICD-10-CM | POA: Diagnosis not present

## 2024-07-07 LAB — CBC WITH DIFFERENTIAL/PLATELET
Basophils Absolute: 0 x10E3/uL (ref 0.0–0.2)
Basos: 1 %
EOS (ABSOLUTE): 0.1 x10E3/uL (ref 0.0–0.4)
Eos: 2 %
Hematocrit: 43 % (ref 34.0–46.6)
Hemoglobin: 14.2 g/dL (ref 11.1–15.9)
Immature Grans (Abs): 0 x10E3/uL (ref 0.0–0.1)
Immature Granulocytes: 0 %
Lymphocytes Absolute: 1.4 x10E3/uL (ref 0.7–3.1)
Lymphs: 31 %
MCH: 30.1 pg (ref 26.6–33.0)
MCHC: 33 g/dL (ref 31.5–35.7)
MCV: 91 fL (ref 79–97)
Monocytes Absolute: 0.4 x10E3/uL (ref 0.1–0.9)
Monocytes: 10 %
Neutrophils Absolute: 2.5 x10E3/uL (ref 1.4–7.0)
Neutrophils: 56 %
Platelets: 237 x10E3/uL (ref 150–450)
RBC: 4.72 x10E6/uL (ref 3.77–5.28)
RDW: 12.4 % (ref 11.7–15.4)
WBC: 4.4 x10E3/uL (ref 3.4–10.8)

## 2024-07-07 LAB — LIPID PANEL
Chol/HDL Ratio: 3.3 ratio (ref 0.0–4.4)
Cholesterol, Total: 226 mg/dL — ABNORMAL HIGH (ref 100–199)
HDL: 69 mg/dL (ref >39)
LDL Chol Calc (NIH): 140 mg/dL — ABNORMAL HIGH (ref 0–99)
Triglycerides: 95 mg/dL (ref 0–149)
VLDL Cholesterol Cal: 17 mg/dL (ref 5–40)

## 2024-07-07 LAB — COMPREHENSIVE METABOLIC PANEL WITH GFR
ALT: 15 IU/L (ref 0–32)
AST: 23 IU/L (ref 0–40)
Albumin: 4.7 g/dL (ref 3.8–4.8)
Alkaline Phosphatase: 95 IU/L (ref 44–121)
BUN/Creatinine Ratio: 16 (ref 12–28)
BUN: 11 mg/dL (ref 8–27)
Bilirubin Total: 0.5 mg/dL (ref 0.0–1.2)
CO2: 20 mmol/L (ref 20–29)
Calcium: 10.5 mg/dL — ABNORMAL HIGH (ref 8.7–10.3)
Chloride: 104 mmol/L (ref 96–106)
Creatinine, Ser: 0.67 mg/dL (ref 0.57–1.00)
Globulin, Total: 2.2 g/dL (ref 1.5–4.5)
Glucose: 93 mg/dL (ref 70–99)
Potassium: 4.4 mmol/L (ref 3.5–5.2)
Sodium: 142 mmol/L (ref 134–144)
Total Protein: 6.9 g/dL (ref 6.0–8.5)
eGFR: 92 mL/min/1.73 (ref >59)

## 2024-07-07 LAB — B12 AND FOLATE PANEL
Folate: >20 ng/mL (ref >3.0)
Vitamin B-12: >2000 pg/mL — ABNORMAL HIGH (ref 232–1245)

## 2024-07-07 LAB — ANA: Anti Nuclear Antibody (ANA): NEGATIVE

## 2024-07-07 LAB — VITAMIN D 25 HYDROXY (VIT D DEFICIENCY, FRACTURES): Vit D, 25-Hydroxy: 167.6 ng/mL — AB (ref 30.0–100.0)

## 2024-07-13 ENCOUNTER — Telehealth: Payer: Self-pay

## 2024-07-13 ENCOUNTER — Encounter: Payer: Self-pay | Admitting: Nurse Practitioner

## 2024-07-13 DIAGNOSIS — F411 Generalized anxiety disorder: Secondary | ICD-10-CM | POA: Insufficient documentation

## 2024-07-13 DIAGNOSIS — I700 Atherosclerosis of aorta: Secondary | ICD-10-CM | POA: Insufficient documentation

## 2024-07-13 DIAGNOSIS — E673 Hypervitaminosis D: Secondary | ICD-10-CM | POA: Insufficient documentation

## 2024-07-13 NOTE — Telephone Encounter (Signed)
 Pt advised that her vitamin D  id high and calcium advised her as per alyssa please stopped all supplement that had vitamin D  in it ASAP due alyssa advised pt Last year her vitamin D  level was elevated and to stop taking vitamin D . She did not stop taking it and continued to take it.  This year her vitamin D  level is 167 which is considered toxic. SHE HAS TO STOP TAKING VITAMIN D  SUPPLEMENTS.Possible symptoms include: high calcium level (which she has), confusion, drowsiness, nausea and/or vomiting, increased thirst, increased urination, abnormal and dangerous heart rhythms, dehydration, and hearing loss. Pt  agreed and stopped taking all supplement that had vitamin D  and advised repeat labs in 3 months

## 2024-07-16 ENCOUNTER — Encounter: Payer: Self-pay | Admitting: Nurse Practitioner

## 2024-07-22 ENCOUNTER — Ambulatory Visit: Admitting: Nurse Practitioner

## 2024-07-27 ENCOUNTER — Ambulatory Visit
Admission: RE | Admit: 2024-07-27 | Discharge: 2024-07-27 | Disposition: A | Discharge status: Home / Self Care | Source: Ambulatory Visit | Attending: Nurse Practitioner | Admitting: Nurse Practitioner

## 2024-07-27 DIAGNOSIS — I340 Nonrheumatic mitral (valve) insufficiency: Secondary | ICD-10-CM | POA: Insufficient documentation

## 2024-07-27 DIAGNOSIS — R002 Palpitations: Secondary | ICD-10-CM | POA: Diagnosis not present

## 2024-07-27 DIAGNOSIS — R0609 Other forms of dyspnea: Secondary | ICD-10-CM | POA: Diagnosis not present

## 2024-07-27 DIAGNOSIS — G4734 Idiopathic sleep related nonobstructive alveolar hypoventilation: Secondary | ICD-10-CM | POA: Diagnosis not present

## 2024-07-27 LAB — ECHOCARDIOGRAM COMPLETE
AR max vel: 2.98 cm2
AV Peak grad: 5.9 mmHg
Ao pk vel: 1.21 m/s
Area-P 1/2: 3.65 cm2
S' Lateral: 3 cm

## 2024-07-27 NOTE — Progress Notes (Signed)
  Echocardiogram 2D Echocardiogram has been performed.  Thea Norlander 07/27/2024, 12:15 PM

## 2024-08-04 DIAGNOSIS — G5603 Carpal tunnel syndrome, bilateral upper limbs: Secondary | ICD-10-CM | POA: Diagnosis not present

## 2024-08-04 DIAGNOSIS — G629 Polyneuropathy, unspecified: Secondary | ICD-10-CM | POA: Diagnosis not present

## 2024-08-04 DIAGNOSIS — G3184 Mild cognitive impairment, so stated: Secondary | ICD-10-CM | POA: Diagnosis not present

## 2024-08-04 DIAGNOSIS — G4700 Insomnia, unspecified: Secondary | ICD-10-CM | POA: Diagnosis not present

## 2024-08-17 ENCOUNTER — Ambulatory Visit (INDEPENDENT_AMBULATORY_CARE_PROVIDER_SITE_OTHER): Admitting: Nurse Practitioner

## 2024-08-17 ENCOUNTER — Telehealth: Payer: Self-pay

## 2024-08-17 ENCOUNTER — Encounter: Payer: Self-pay | Admitting: Nurse Practitioner

## 2024-08-17 DIAGNOSIS — G4719 Other hypersomnia: Secondary | ICD-10-CM

## 2024-08-17 DIAGNOSIS — E673 Hypervitaminosis D: Secondary | ICD-10-CM | POA: Diagnosis not present

## 2024-08-17 DIAGNOSIS — I340 Nonrheumatic mitral (valve) insufficiency: Secondary | ICD-10-CM | POA: Diagnosis not present

## 2024-08-17 NOTE — Progress Notes (Signed)
 [facility] [address] [place], [place] [address]  Internal MEDICINE  Office Visit Note  Patient Name: Danielle Nash  [id number]  [id number]  Date of Service: 08/17/2024  Chief Complaint  Patient presents with   Depression   Hyperlipidemia   Follow-up    Review labs and echo     HPI Danielle Nash presents for a follow-up visit for lab results and echocardiogram results.  CBC is normal Hypercalcemia -- may be product of high vitamin D  Elevated LDL 140, high total cholesterol 226.  Normal B12 and folate Toxic level of vitamin D   ANA is negative.  Echocardiogram results: mild mitral regurgitation, borderline LVH, trivial tricuspid regurgitation, trivial pulmonic valve regurgitation. Normal EF.     Current Medication: Outpatient Encounter Medications as of 08/17/2024  Medication Sig   Ascorbic Acid (VITAMIN C PO) Take 1,000 mg by mouth daily.   B Complex Vitamins (VITAMIN-B COMPLEX) TABS Take 1 tablet by mouth 1 day or 1 dose.   Biotin 5000 MCG TABS Take by mouth daily.   Cholecalciferol (VITAMIN D ) 2000 units tablet Take 5,000 Units by mouth 1 day or 1 dose.   Emollient (COLLAGEN EX) Apply 1 tablet topically daily. 5 vitamins and minerals   FOLIC ACID  PO Take by mouth daily.   Iodine, Kelp, (KELP PO) Take 325 mg by mouth daily.   Krill Oil 1000 MG CAPS Take by mouth daily. Krill Oil   loratadine (CLARITIN) 10 MG tablet Take 10 mg by mouth daily.   Magnesium 500 MG CAPS Take by mouth.   OVER THE COUNTER MEDICATION 1 tablet daily. Annatrol bone and heart support   OVER THE COUNTER MEDICATION Take 2 tablets by mouth daily. Liposomal - D3 - K2 125 mg - 100mg    OVER THE COUNTER MEDICATION Take 2 tablets by mouth daily. Osteo - Organic  Cal plus 5000iu-03, magnesium 400 mg   OVER THE COUNTER MEDICATION Take 2 tablets by mouth daily. Bone strength, calcium 60 mg - D3 50 mcg - K2 - mag 380 mg   Probiotic Product (PROBIOTIC & ACIDOPHILUS EX ST) CAPS Take 1 tablet by mouth 1  day or 1 dose.   triamcinolone  cream (KENALOG ) 0.1 % Apply 1 Application topically as needed.   Turmeric (QC TUMERIC COMPLEX PO) Take 1 tablet by mouth daily. With washabi extract   vitamin B-12 (CYANOCOBALAMIN ) 1000 MCG tablet Take 1 tablet by mouth 1 day or 1 dose.   Zinc Acetate, Oral, (ZINC ACETATE PO) Take 100 mg by mouth 2 (two) times daily.   No facility-administered encounter medications on file as of 08/17/2024.    Surgical History: Past Surgical History:  Procedure Laterality Date   COLONOSCOPY     HERNIA REPAIR     INCISIONAL HERNIA REPAIR N/A 09/05/2017   Procedure: HERNIA REPAIR INCISIONAL;  Surgeon: Danielle Selinda Birmingham, MD;  Location: [facility] ORS;  Service: General;  Laterality: N/A;   KNEE ARTHROSCOPY     she is not sure of which knee   LAPAROSCOPIC APPENDECTOMY N/A 10/19/2016   Procedure: APPENDECTOMY LAPAROSCOPIC;  Surgeon: Danielle Laurence III, MD;  Location: [facility] ORS;  Service: General;  Laterality: N/A;   MASS EXCISION Right 03/11/2023   Procedure: EXCISION OF BENIGN NEOPLASM;  Surgeon: Danielle Norleen PARAS, MD;  Location: [facility] ORS;  Service: Orthopedics;  Laterality: Right;   MEDIAL PARTIAL KNEE REPLACEMENT Bilateral    REVERSE SHOULDER ARTHROPLASTY Right 03/11/2023   Procedure: REVERSE SHOULDER ARTHROPLASTY;  Surgeon: Danielle Norleen PARAS, MD;  Location: [facility]  ORS;  Service: Orthopedics;  Laterality: Right;   THYROID  SURGERY      Medical History: Past Medical History:  Diagnosis Date   Acute appendicitis 03/11/2016   Allergy    Allergy-induced asthma    Anxiety    Arthritis    Depression    Frequent headaches    History of kidney stones    Hypercalcemia 03/18/2019   Hyperlipidemia    Irritable bowel syndrome (IBS)    Neuropathy    PONV (postoperative nausea and vomiting)    Thyroid  disease     Family History: Family History  Problem Relation Age of Onset   Arthritis Mother    Diabetes Mother    Heart disease Father    Breast cancer Sister    Diabetes Maternal Uncle      Social History   Socioeconomic History   Marital status: Married    Spouse name: Vinie   Number of children: Not on file   Years of education: Not on file   Highest education level: Not on file  Occupational History   Not on file  Tobacco Use   Smoking status: Never   Smokeless tobacco: Never  Vaping Use   Vaping status: Never Used  Substance and Sexual Activity   Alcohol use: No    Alcohol/week: 0.0 standard drinks of alcohol   Drug use: No   Sexual activity: Never  Other Topics Concern   Not on file  Social History Narrative   Not on file   Social Drivers of Health   Financial Resource Strain: Low Risk  (06/08/2024)   Received from [facility]   Overall Financial Resource Strain (CARDIA)    Difficulty of Paying Living Expenses: Not very hard  Food Insecurity: No Food Insecurity (06/08/2024)   Received from [facility]   Hunger Vital Sign    Within the past 12 months, you worried that your food would run out before you got the money to buy more.: Never true    Within the past 12 months, the food you bought just didn't last and you didn't have money to get more.: Never true  Transportation Needs: No Transportation Needs (06/08/2024)   Received from [facility] - Transportation    In the past 12 months, has lack of transportation kept you from medical appointments or from getting medications?: No    Lack of Transportation (Non-Medical): No  Physical Activity: Insufficiently Active (01/30/2022)   Exercise Vital Sign    Days of Exercise per Week: 3 days    Minutes of Exercise per Session: 20 min  Stress: No Stress Concern Present (01/30/2022)   [employer] of Occupational Health - Occupational Stress Questionnaire    Feeling of Stress : Only a little  Social Connections: Socially Integrated (01/30/2022)   Social Connection and Isolation Panel    Frequency of Communication with Friends and Family:  Twice a week    Frequency of Social Gatherings with Friends and Family: Once a week    Attends Religious Services: More than 4 times per year    Active Member of [employer] or Organizations: Yes    Attends Banker Meetings: More than 4 times per year    Marital Status: Married  Catering manager Violence: Not At Risk (01/30/2022)   Humiliation, Afraid, Rape, and Kick questionnaire    Fear of Current or Ex-Partner: No    Emotionally Abused: No    Physically Abused: No  Sexually Abused: No      Review of Systems  Constitutional:  Positive for appetite change and fatigue. Negative for activity change, chills, fever and unexpected weight change.  HENT: Negative.  Negative for congestion, ear pain, rhinorrhea, sore throat and trouble swallowing.   Eyes: Negative.   Respiratory: Negative.  Negative for cough, chest tightness, shortness of breath and wheezing.   Cardiovascular: Negative.  Negative for chest pain and palpitations.  Gastrointestinal:  Positive for constipation, diarrhea and nausea. Negative for abdominal distention, abdominal pain, blood in stool and vomiting.  Endocrine: Negative.   Genitourinary: Negative.  Negative for difficulty urinating, dysuria, frequency, hematuria and urgency.  Musculoskeletal:  Positive for arthralgias. Negative for back pain, joint swelling, myalgias and neck pain.  Skin: Negative.  Negative for rash and wound.  Allergic/Immunologic: Negative.  Negative for immunocompromised state.  Neurological:  Positive for dizziness and weakness. Negative for seizures, numbness and headaches.  Hematological: Negative.   Psychiatric/Behavioral:  Positive for depression. Negative for behavioral problems, self-injury, sleep disturbance and suicidal ideas. The patient is not nervous/anxious.     Vital Signs: BP (!) 144/93   Pulse 66   Temp (!) 97.5 F (36.4 C)   Resp 16   Ht 5' (1.524 m)   Wt 177 lb 9.6 oz (80.6 kg)   SpO2 95%   BMI 34.69 kg/m     Physical Exam Vitals reviewed.  Constitutional:      General: She is not in acute distress.    Appearance: Normal appearance. She is not ill-appearing.  HENT:     Head: Normocephalic and atraumatic.  Eyes:     Pupils: Pupils are equal, round, and reactive to light.  Cardiovascular:     Rate and Rhythm: Normal rate and regular rhythm.  Pulmonary:     Effort: Pulmonary effort is normal. No respiratory distress.  Neurological:     Mental Status: She is alert and oriented to person, place, and time.  Psychiatric:        Mood and Affect: Mood normal.        Behavior: Behavior normal.        Assessment/Plan: 1. Hypercalcemia (Primary) Repeat labs ordered to be done in mid to late november - Vitamin D  (25 hydroxy) - Ca+Creat+P+PTH Intact  2. Hypervitaminosis D Repeat labs ordered to be done in mid to late november - Vitamin D  (25 hydroxy) - Ca+Creat+P+PTH Intact  3. Excessive daytime sleepiness Probably symptom of vitamin D  toxicity.   4. Mild mitral regurgitation Noted on echocardiogram, will continue to monitor.    General Counseling: Danielle Nash understanding of the findings of todays visit and agrees with plan of treatment. I have discussed any further diagnostic evaluation that may be needed or ordered today. We also reviewed her medications today. she has been encouraged to call the office with any questions or concerns that should arise related to todays visit.    Orders Placed This Encounter  Procedures   Vitamin D  (25 hydroxy)   Ca+Creat+P+PTH Intact    No orders of the defined types were placed in this encounter.   Return in about 3 months (around 11/16/2024) for F/U, Danielle Nash PCP review lab results .   Total time spent:30 Minutes Time spent includes review of chart, medications, test results, and follow up plan with the patient.   [place] Controlled Substance Database was reviewed by me.  This patient was seen by Mardy Maxin, FNP-C in  collaboration with Dr. Sigrid Bathe as a part of collaborative  care agreement.   Danielle Palmatier R. Liana, MSN, FNP-C Internal medicine

## 2024-08-20 ENCOUNTER — Other Ambulatory Visit: Payer: Self-pay | Admitting: Nurse Practitioner

## 2024-08-20 MED ORDER — OXYBUTYNIN CHLORIDE 5 MG PO TABS: 5.0000 mg | ORAL_TABLET | Freq: Three times a day (TID) | ORAL | 1 refills | Status: DC | PRN

## 2024-08-20 MED ORDER — METHOCARBAMOL 500 MG PO TABS: 500.0000 mg | ORAL_TABLET | Freq: Four times a day (QID) | ORAL | 3 refills | Status: DC | PRN

## 2024-08-20 NOTE — Telephone Encounter (Signed)
Pt notified that we sent med

## 2024-08-20 NOTE — Progress Notes (Signed)
 Pt notified that we sent med and see other note

## 2024-08-23 DIAGNOSIS — L821 Other seborrheic keratosis: Secondary | ICD-10-CM | POA: Diagnosis not present

## 2024-08-23 DIAGNOSIS — L910 Hypertrophic scar: Secondary | ICD-10-CM | POA: Diagnosis not present

## 2024-08-23 DIAGNOSIS — L570 Actinic keratosis: Secondary | ICD-10-CM | POA: Diagnosis not present

## 2024-08-23 DIAGNOSIS — L578 Other skin changes due to chronic exposure to nonionizing radiation: Secondary | ICD-10-CM | POA: Diagnosis not present

## 2024-08-23 DIAGNOSIS — Z872 Personal history of diseases of the skin and subcutaneous tissue: Secondary | ICD-10-CM | POA: Diagnosis not present

## 2024-08-23 DIAGNOSIS — Z85828 Personal history of other malignant neoplasm of skin: Secondary | ICD-10-CM | POA: Diagnosis not present

## 2024-11-10 LAB — CA+CREAT+P+PTH INTACT
Calcium: 10.9 mg/dL — ABNORMAL HIGH (ref 8.7–10.3)
Creatinine, Ser: 0.72 mg/dL (ref 0.57–1.00)
PTH: 30 pg/mL (ref 15–65)
Phosphorus: 2.9 mg/dL — ABNORMAL LOW (ref 3.0–4.3)
eGFR: 87 mL/min/1.73 (ref >59)

## 2024-11-10 LAB — VITAMIN D 25 HYDROXY (VIT D DEFICIENCY, FRACTURES): Vit D, 25-Hydroxy: 73.6 ng/mL (ref 30.0–100.0)

## 2024-11-16 ENCOUNTER — Ambulatory Visit: Admitting: Nurse Practitioner

## 2024-11-19 ENCOUNTER — Encounter: Payer: Self-pay | Admitting: Nurse Practitioner

## 2024-11-19 ENCOUNTER — Ambulatory Visit: Admitting: Nurse Practitioner

## 2024-11-19 DIAGNOSIS — Z833 Family history of diabetes mellitus: Secondary | ICD-10-CM

## 2024-11-19 DIAGNOSIS — E673 Hypervitaminosis D: Secondary | ICD-10-CM

## 2024-11-19 DIAGNOSIS — G4719 Other hypersomnia: Secondary | ICD-10-CM

## 2024-11-19 DIAGNOSIS — R5382 Chronic fatigue, unspecified: Secondary | ICD-10-CM

## 2024-11-19 DIAGNOSIS — E538 Deficiency of other specified B group vitamins: Secondary | ICD-10-CM

## 2024-11-19 DIAGNOSIS — R531 Weakness: Secondary | ICD-10-CM

## 2024-11-19 DIAGNOSIS — E039 Hypothyroidism, unspecified: Secondary | ICD-10-CM

## 2024-11-19 DIAGNOSIS — I700 Atherosclerosis of aorta: Secondary | ICD-10-CM

## 2024-11-19 NOTE — Progress Notes (Unsigned)
 [facility] [address] [place], [place] [address]  Internal MEDICINE  Office Visit Note  Patient Name: Danielle Nash  [id number]  [id number]  Date of Service: 11/23/2024  Chief Complaint  Patient presents with   Depression   Hyperlipidemia   Follow-up    HPI [name] presents for a follow-up visit for lab results  Elevated calcium level 10.2 Low phosphorus 2.9 Normal PTH  Vitamin D  level is now normal -- not taking any vitamin D  supplements.  Consider check A1c next time -- had an elevated glucose in 2018 but fasting glucose has been normal since then Osteoporosis of lumbar spine with T score of -4.2 and osteoporosis of right femur neck with T-score of -3.0.  Discussed possibility of multiple myeloma. -- she continues to have progressive fatigue, generalized weakness, and musculoskeletal pains. She reports that her arms and legs hurt more after she tries to do some exercise. She has elevated calcium, low phosphorus and normal PTH. She also has significant osteoporosis which she is unwilling to take medication to treat. She is hesitant to have anymore labs done now but is ok with having additional labs added on for her annual labs in June/July next year. She states that if she does have any form of cancer, she will not take any chemotherapy. She is still unsure if she wants to pursue further evaluation for any diagnosis. We also discussed the possible symptoms and what to expect if she does have multiple myeloma or something similar and does not have it diagnosed and/or treated.    Current Medication: Outpatient Encounter Medications as of 11/19/2024  Medication Sig   Ascorbic Acid (VITAMIN C PO) Take 1,000 mg by mouth daily.   B Complex Vitamins (VITAMIN-B COMPLEX) TABS Take 1 tablet by mouth 1 day or 1 dose.   Biotin 5000 MCG TABS Take by mouth daily.   Emollient (COLLAGEN EX) Apply 1 tablet topically daily. 5 vitamins and minerals   FOLIC ACID  PO Take by mouth daily.    Iodine, Kelp, (KELP PO) Take 325 mg by mouth daily.   Krill Oil 1000 MG CAPS Take by mouth daily. Krill Oil   loratadine (CLARITIN) 10 MG tablet Take 10 mg by mouth daily.   Magnesium 500 MG CAPS Take by mouth.   OVER THE COUNTER MEDICATION 1 tablet daily. Annatrol bone and heart support   OVER THE COUNTER MEDICATION Take 2 tablets by mouth daily. Liposomal - D3 - K2 125 mg - 100mg    OVER THE COUNTER MEDICATION Take 2 tablets by mouth daily. Osteo - Organic  Cal plus 5000iu-03, magnesium 400 mg   OVER THE COUNTER MEDICATION Take 2 tablets by mouth daily. Bone strength, calcium 60 mg - D3 50 mcg - K2 - mag 380 mg   Probiotic Product (PROBIOTIC & ACIDOPHILUS EX ST) CAPS Take 1 tablet by mouth 1 day or 1 dose.   triamcinolone  cream (KENALOG ) 0.1 % Apply 1 Application topically as needed.   Turmeric (QC TUMERIC COMPLEX PO) Take 1 tablet by mouth daily. With washabi extract   vitamin B-12 (CYANOCOBALAMIN ) 1000 MCG tablet Take 1 tablet by mouth 1 day or 1 dose.   Zinc Acetate, Oral, (ZINC ACETATE PO) Take 100 mg by mouth 2 (two) times daily.   [DISCONTINUED] Cholecalciferol (VITAMIN D ) 2000 units tablet Take 5,000 Units by mouth 1 day or 1 dose. (Patient not taking: Reported on 11/19/2024)   [DISCONTINUED] methocarbamol  (ROBAXIN ) 500 MG tablet Take 1-2 tablets (500-1,000 mg total) by  mouth every 6 (six) hours as needed for muscle spasms (or sciatica). (Patient not taking: Reported on 11/19/2024)   [DISCONTINUED] oxybutynin  (DITROPAN ) 5 MG tablet Take 1 tablet (5 mg total) by mouth 3 (three) times daily as needed (overactive bladder). (Patient not taking: Reported on 11/19/2024)   No facility-administered encounter medications on file as of 11/19/2024.    Surgical History: Past Surgical History:  Procedure Laterality Date   COLONOSCOPY     HERNIA REPAIR     INCISIONAL HERNIA REPAIR N/A 09/05/2017   Procedure: HERNIA REPAIR INCISIONAL;  Surgeon: Nicholaus Selinda Birmingham, MD;  Location: [facility] ORS;  Service:  General;  Laterality: N/A;   KNEE ARTHROSCOPY     she is not sure of which knee   LAPAROSCOPIC APPENDECTOMY N/A 10/19/2016   Procedure: APPENDECTOMY LAPAROSCOPIC;  Surgeon: Elgin Laurence III, MD;  Location: [facility] ORS;  Service: General;  Laterality: N/A;   MASS EXCISION Right 03/11/2023   Procedure: EXCISION OF BENIGN NEOPLASM;  Surgeon: Edie Norleen PARAS, MD;  Location: [facility] ORS;  Service: Orthopedics;  Laterality: Right;   MEDIAL PARTIAL KNEE REPLACEMENT Bilateral    REVERSE SHOULDER ARTHROPLASTY Right 03/11/2023   Procedure: REVERSE SHOULDER ARTHROPLASTY;  Surgeon: Edie Norleen PARAS, MD;  Location: [facility] ORS;  Service: Orthopedics;  Laterality: Right;   THYROID  SURGERY      Medical History: Past Medical History:  Diagnosis Date   Acute appendicitis 03/11/2016   Allergy    Allergy-induced asthma    Anxiety    Arthritis    Depression    Frequent headaches    History of kidney stones    Hypercalcemia 03/18/2019   Hyperlipidemia    Irritable bowel syndrome (IBS)    Neuropathy    PONV (postoperative nausea and vomiting)    Thyroid  disease     Family History: Family History  Problem Relation Age of Onset   Arthritis Mother    Diabetes Mother    Heart disease Father    Breast cancer Sister    Diabetes Maternal Uncle     Social History   Socioeconomic History   Marital status: Married    Spouse name: Vinie   Number of children: Not on file   Years of education: Not on file   Highest education level: Not on file  Occupational History   Not on file  Tobacco Use   Smoking status: Never   Smokeless tobacco: Never  Vaping Use   Vaping status: Never Used  Substance and Sexual Activity   Alcohol use: No    Alcohol/week: 0.0 standard drinks of alcohol   Drug use: No   Sexual activity: Never  Other Topics Concern   Not on file  Social History Narrative   Not on file   Social Drivers of Health   Financial Resource Strain: Low Risk  (06/08/2024)   Received from [facility]   Overall Financial Resource Strain (CARDIA)    Difficulty of Paying Living Expenses: Not very hard  Food Insecurity: No Food Insecurity (06/08/2024)   Received from [facility]   Hunger Vital Sign    Within the past 12 months, you worried that your food would run out before you got the money to buy more.: Never true    Within the past 12 months, the food you bought just didn't last and you didn't have money to get more.: Never true  Transportation Needs: No Transportation Needs (06/08/2024)   Received from [facility]   PRAPARE -  Transportation    In the past 12 months, has lack of transportation kept you from medical appointments or from getting medications?: No    Lack of Transportation (Non-Medical): No  Physical Activity: Insufficiently Active (01/30/2022)   Exercise Vital Sign    Days of Exercise per Week: 3 days    Minutes of Exercise per Session: 20 min  Stress: No Stress Concern Present (01/30/2022)   [employer] of Occupational Health - Occupational Stress Questionnaire    Feeling of Stress : Only a little  Social Connections: Socially Integrated (01/30/2022)   Social Connection and Isolation Panel    Frequency of Communication with Friends and Family: Twice a week    Frequency of Social Gatherings with Friends and Family: Once a week    Attends Religious Services: More than 4 times per year    Active Member of [employer] or Organizations: Yes    Attends Engineer, Structural: More than 4 times per year    Marital Status: Married  Catering Manager Violence: Not At Risk (01/30/2022)   Humiliation, Afraid, Rape, and Kick questionnaire    Fear of Current or Ex-Partner: No    Emotionally Abused: No    Physically Abused: No    Sexually Abused: No      Review of Systems  Constitutional:  Positive for activity change and fatigue.  HENT: Negative.    Respiratory: Negative.  Negative for cough, chest tightness, shortness  of breath and wheezing.   Cardiovascular: Negative.  Negative for chest pain and palpitations.  Gastrointestinal: Negative.   Genitourinary: Negative.   Musculoskeletal:  Positive for arthralgias, back pain and myalgias.       Leg weakness and soreness.   Neurological:  Positive for dizziness.  Psychiatric/Behavioral:  Positive for sleep disturbance. Negative for self-injury and suicidal ideas. The patient is nervous/anxious.     Vital Signs: BP 121/76   Pulse 84   Temp 97.6 F (36.4 C)   Resp 16   Ht 5' (1.524 m)   Wt 179 lb 3.2 oz (81.3 kg)   SpO2 94%   BMI 35.00 kg/m    Physical Exam Constitutional:      General: She is not in acute distress.    Appearance: Normal appearance. She is obese. She is not ill-appearing.  HENT:     Head: Normocephalic and atraumatic.  Eyes:     Pupils: Pupils are equal, round, and reactive to light.  Cardiovascular:     Rate and Rhythm: Normal rate and regular rhythm.  Pulmonary:     Effort: Pulmonary effort is normal. No respiratory distress.  Skin:    General: Skin is warm and dry.     Capillary Refill: Capillary refill takes less than 2 seconds.  Neurological:     Mental Status: She is alert and oriented to person, place, and time.  Psychiatric:        Mood and Affect: Mood normal.        Behavior: Behavior normal.        Assessment/Plan: 1. Hypercalcemia (Primary) Noted on labs, discussed additional labs to have drawn but patient would like to wait to draw any more labs until her annual labs in June/July.  2. Chronic fatigue Hypervitaminosis D was corrected. PTH was normal. B12 is corrected.   3. Generalized weakness Vitamin abnormalities corrected. Hyperparathyroidism ruled out. Discussed possible malignancy such as multiple myeloma or lymphoma. Not willing to have additional labs drawn at this time. Ok with adding the labs to her  routine blood drawn for June/July next year.   4. Hypophosphatemia Noted on labs, discussed  additional labs to have drawn but patient would like to wait to draw any more labs until her annual labs in June/July.  5. Hypervitaminosis D Corrected with patient stopping all OTC supplements containing vitamin D   6. Aortic atherosclerosis Declined statin therapy.   7. Excessive daytime sleepiness Does not sleep well. Declined sleep study in the past.   8. B12 deficiency Takes B12 supplement, may continue OTC.  9. Acquired hypothyroidism S/p partial surgical removal. Not currently on thyroid  medications. Last TSH/free T4 was normal.   10. Family history of diabetes mellitus type II Will check A1c on labs in June    General Counseling: Estefana oakland understanding of the findings of todays visit and agrees with plan of treatment. I have discussed any further diagnostic evaluation that may be needed or ordered today. We also reviewed her medications today. she has been encouraged to call the office with any questions or concerns that should arise related to todays visit.    No orders of the defined types were placed in this encounter.   No orders of the defined types were placed in this encounter.   No follow-ups on file.   Total time spent:30 Minutes Time spent includes review of chart, medications, test results, and follow up plan with the patient.   [place] Controlled Substance Database was reviewed by me.  This patient was seen by Mardy Maxin, FNP-C in collaboration with Dr. Sigrid Bathe as a part of collaborative care agreement.   Jaquesha Boroff R. Maxin, MSN, FNP-C Internal medicine

## 2024-11-23 ENCOUNTER — Encounter: Payer: Self-pay | Admitting: Nurse Practitioner

## 2024-11-23 DIAGNOSIS — Z833 Family history of diabetes mellitus: Secondary | ICD-10-CM | POA: Insufficient documentation

## 2025-06-15 ENCOUNTER — Ambulatory Visit: Admitting: Nurse Practitioner
